# Patient Record
Sex: Male | Born: 1975 | Race: Black or African American | Hispanic: No | Marital: Married | Smoking: Never smoker
Health system: Southern US, Community
[De-identification: ages and names within clinical notes are randomized; demographics above are authoritative.]

## PROBLEM LIST (undated history)

## (undated) ENCOUNTER — Emergency Department (HOSPITAL_COMMUNITY): Admission: EM | Disposition: A | Discharge status: Home / Self Care | Payer: Self-pay

## (undated) DIAGNOSIS — F419 Anxiety disorder, unspecified: Secondary | ICD-10-CM

## (undated) DIAGNOSIS — R519 Headache, unspecified: Secondary | ICD-10-CM

## (undated) DIAGNOSIS — M1990 Unspecified osteoarthritis, unspecified site: Secondary | ICD-10-CM

## (undated) DIAGNOSIS — T7840XA Allergy, unspecified, initial encounter: Secondary | ICD-10-CM

## (undated) DIAGNOSIS — J329 Chronic sinusitis, unspecified: Secondary | ICD-10-CM

## (undated) DIAGNOSIS — F329 Major depressive disorder, single episode, unspecified: Secondary | ICD-10-CM

## (undated) DIAGNOSIS — E785 Hyperlipidemia, unspecified: Secondary | ICD-10-CM

## (undated) DIAGNOSIS — I10 Essential (primary) hypertension: Secondary | ICD-10-CM

## (undated) DIAGNOSIS — K219 Gastro-esophageal reflux disease without esophagitis: Secondary | ICD-10-CM

## (undated) DIAGNOSIS — F32A Depression, unspecified: Secondary | ICD-10-CM

## (undated) DIAGNOSIS — R51 Headache: Secondary | ICD-10-CM

## (undated) HISTORY — PX: SPINE SURGERY: SHX786

## (undated) HISTORY — DX: Essential (primary) hypertension: I10

## (undated) HISTORY — DX: Hyperlipidemia, unspecified: E78.5

## (undated) HISTORY — PX: CARDIAC CATHETERIZATION: SHX172

## (undated) HISTORY — DX: Allergy, unspecified, initial encounter: T78.40XA

---

## 2000-06-17 HISTORY — PX: OTHER SURGICAL HISTORY: SHX169

## 2003-06-05 ENCOUNTER — Emergency Department (HOSPITAL_COMMUNITY): Admission: AD | Admit: 2003-06-05 | Discharge: 2003-06-05 | Payer: Self-pay | Admitting: Family Medicine

## 2004-03-17 ENCOUNTER — Emergency Department (HOSPITAL_COMMUNITY): Admission: EM | Admit: 2004-03-17 | Discharge: 2004-03-17 | Payer: Self-pay | Admitting: Unknown Physician Specialty

## 2004-03-17 ENCOUNTER — Emergency Department (HOSPITAL_COMMUNITY): Admission: EM | Admit: 2004-03-17 | Discharge: 2004-03-17 | Payer: Self-pay | Admitting: Family Medicine

## 2004-03-18 ENCOUNTER — Emergency Department (HOSPITAL_COMMUNITY): Admission: EM | Admit: 2004-03-18 | Discharge: 2004-03-18 | Payer: Self-pay | Admitting: Emergency Medicine

## 2004-05-19 ENCOUNTER — Encounter: Admission: RE | Admit: 2004-05-19 | Discharge: 2004-05-19 | Payer: Self-pay | Admitting: Emergency Medicine

## 2004-05-30 ENCOUNTER — Encounter: Admission: RE | Admit: 2004-05-30 | Discharge: 2004-05-30 | Payer: Self-pay | Admitting: Emergency Medicine

## 2004-06-13 ENCOUNTER — Encounter: Admission: RE | Admit: 2004-06-13 | Discharge: 2004-06-13 | Payer: Self-pay | Admitting: Emergency Medicine

## 2006-05-13 ENCOUNTER — Emergency Department (HOSPITAL_COMMUNITY): Admission: EM | Admit: 2006-05-13 | Discharge: 2006-05-13 | Payer: Self-pay | Admitting: Emergency Medicine

## 2006-12-05 ENCOUNTER — Encounter: Admission: RE | Admit: 2006-12-05 | Discharge: 2006-12-05 | Payer: Self-pay | Admitting: Emergency Medicine

## 2009-08-30 ENCOUNTER — Emergency Department (HOSPITAL_COMMUNITY): Admission: EM | Admit: 2009-08-30 | Discharge: 2009-08-30 | Payer: Self-pay | Admitting: Emergency Medicine

## 2009-11-27 ENCOUNTER — Emergency Department (HOSPITAL_COMMUNITY): Admission: EM | Admit: 2009-11-27 | Discharge: 2009-11-27 | Payer: Self-pay | Admitting: Emergency Medicine

## 2010-02-07 ENCOUNTER — Ambulatory Visit (HOSPITAL_COMMUNITY): Admission: RE | Admit: 2010-02-07 | Discharge: 2010-02-07 | Payer: Self-pay | Admitting: Internal Medicine

## 2010-04-04 ENCOUNTER — Encounter
Admission: RE | Admit: 2010-04-04 | Discharge: 2010-05-23 | Payer: Self-pay | Source: Home / Self Care | Attending: Neurological Surgery | Admitting: Neurological Surgery

## 2010-05-03 ENCOUNTER — Emergency Department (HOSPITAL_COMMUNITY)
Admission: EM | Admit: 2010-05-03 | Discharge: 2010-05-03 | Payer: Self-pay | Source: Home / Self Care | Admitting: Family Medicine

## 2010-07-08 ENCOUNTER — Encounter: Payer: Self-pay | Admitting: Emergency Medicine

## 2010-08-28 LAB — POCT RAPID STREP A (OFFICE): Streptococcus, Group A Screen (Direct): NEGATIVE

## 2011-07-12 ENCOUNTER — Ambulatory Visit: Payer: Self-pay | Admitting: Family

## 2011-07-19 ENCOUNTER — Encounter: Payer: Self-pay | Admitting: Internal Medicine

## 2011-07-19 ENCOUNTER — Ambulatory Visit (INDEPENDENT_AMBULATORY_CARE_PROVIDER_SITE_OTHER): Payer: 59 | Admitting: Internal Medicine

## 2011-07-19 VITALS — BP 142/88 | HR 93 | Temp 98.8°F | Ht 72.0 in | Wt 246.0 lb

## 2011-07-19 DIAGNOSIS — E119 Type 2 diabetes mellitus without complications: Secondary | ICD-10-CM

## 2011-07-19 LAB — HEMOGLOBIN A1C: Hgb A1c MFr Bld: 8.1 % — ABNORMAL HIGH (ref 4.6–6.5)

## 2011-07-19 MED ORDER — LOSARTAN POTASSIUM-HCTZ 50-12.5 MG PO TABS: 1.0000 | ORAL_TABLET | Freq: Every day | ORAL | Status: DC

## 2011-07-19 MED ORDER — PRAVASTATIN SODIUM 40 MG PO TABS: 40.0000 mg | ORAL_TABLET | Freq: Every day | ORAL | Status: DC

## 2011-07-19 MED ORDER — METFORMIN HCL 500 MG PO TABS: 500.0000 mg | ORAL_TABLET | Freq: Two times a day (BID) | ORAL | Status: DC

## 2011-07-19 NOTE — Patient Instructions (Signed)
Please check your hemoglobin A1c every 3 months  Limit your sodium (Salt) intake    It is important that you exercise regularly, at least 20 minutes 3 to 4 times per week.  If you develop chest pain or shortness of breath seek  medical attention.   

## 2011-07-19 NOTE — Progress Notes (Signed)
Subjective:    Patient ID: Reginald Kirby, male    DOB: 19-Oct-1975, 36 y.o.   MRN: [id number]  HPI  36 year old patient who is seen today to establish with our practice. He has a history of hypertension since 2002 and diabetes mellitus and dyslipidemia since 2007. He is doing quite well today without concerns or complaints.  He does have a history of lumbar disc disease and has required epidurals in the past this was in 2002 has seen Dr. 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  HENT: Negative for hearing loss, ear pain, congestion, rhinorrhea, sneezing, mouth sores, trouble swallowing, neck pain, neck stiffness, dental problem, voice change, sinus pressure and tinnitus.   Eyes: Negative for photophobia, pain, redness and visual disturbance.  Respiratory: Negative for apnea, cough, choking, chest tightness, shortness of breath and wheezing.   Cardiovascular: Negative for chest pain, palpitations and leg swelling.  Gastrointestinal: Negative for nausea, vomiting, abdominal pain, diarrhea, constipation, blood in stool, abdominal distention, anal bleeding and rectal pain.  Genitourinary: Negative for dysuria, urgency, frequency, hematuria, flank pain, decreased urine volume, discharge, penile swelling, scrotal swelling, difficulty urinating, genital sores and testicular pain.  Musculoskeletal: Negative for myalgias, back pain, joint swelling, arthralgias and gait problem.  Skin: Negative for  color change, rash and wound.  Neurological: Negative for dizziness, tremors, seizures, syncope, facial asymmetry, speech difficulty, weakness, light-headedness, numbness and headaches.  Hematological: Negative for adenopathy. Does not bruise/bleed easily.  Psychiatric/Behavioral: Negative for suicidal ideas, hallucinations, behavioral problems, confusion, sleep disturbance, self-injury, dysphoric mood, decreased concentration and agitation. The patient is not nervous/anxious.        Objective:   Physical Exam  Constitutional: He appears well-developed and well-nourished.  HENT:  Head: Normocephalic and atraumatic.  Right Ear: External ear normal.  Left Ear: External ear normal.  Nose: Nose normal.  Mouth/Throat: Oropharynx is clear and moist.  Eyes: Conjunctivae and EOM are normal. Pupils are equal, round, and reactive to light. No scleral icterus.  Neck: Normal range of motion. Neck supple. No JVD present. No thyromegaly present.  Cardiovascular: Regular rhythm, normal heart sounds and intact distal pulses.  Exam reveals no gallop and no friction rub.   No murmur heard. Pulmonary/Chest: Effort normal and breath sounds normal. He exhibits no tenderness.  Abdominal: Soft. Bowel sounds are normal. He exhibits no distension and no mass. There is no tenderness.  Genitourinary: Prostate normal and penis normal.  Musculoskeletal: Normal range of motion. He exhibits no edema and no tenderness.  Lymphadenopathy:    He has no cervical adenopathy.  Neurological: He is alert. He has normal reflexes. No cranial nerve deficit. Coordination normal.  Skin: Skin is warm and dry. No rash noted.  Psychiatric: He has a normal mood and affect. His behavior is normal.          Assessment & Plan:  Preventive health examination Hypertension well controlled Dyslipidemia. The patient has been off prednisone for several months due to myalgias he had been on this medication for a number of  years prior.  He will rechallenge with the pravastatin and a lipid profile will be checked in 3 months. He'll again discontinue this medication if not well tolerated Diabetes mellitus we'll check a hemoglobin A1c. Continue efforts at diet weight loss and exercise

## 2011-07-23 NOTE — Progress Notes (Signed)
Quick Note:  Pt aware ______ 

## 2011-09-05 ENCOUNTER — Telehealth: Payer: Self-pay | Admitting: *Deleted

## 2011-09-05 MED ORDER — METFORMIN HCL 1000 MG PO TABS: 1000.0000 mg | ORAL_TABLET | Freq: Two times a day (BID) | ORAL | Status: DC

## 2011-09-05 NOTE — Telephone Encounter (Signed)
I see where lab done and dose was changed but not changed in chart - will do now and send new rx to pharmacy Attempt to call pharmacy x3 - busy - no alternate # to call

## 2011-09-05 NOTE — Telephone Encounter (Signed)
[facility] has questions about Metformin dosages and strength.  Please call and confirm. Pt states he not taking meds how his prescription is written??

## 2011-10-07 ENCOUNTER — Telehealth: Payer: Self-pay | Admitting: Family Medicine

## 2011-10-07 NOTE — Telephone Encounter (Signed)
[address] [place], [place] [address] p. [phone] f. [phone] To: [facility] Fax: [phone] From: [facility] Date/ Time: 10/06/2011 10:03 AM Taken By: Ezra Sites, RN Caller: Alden Server Facility: Not Collected Patient: Reginald Kirby, Reginald Kirby DOB: [birth year] Phone: [phone] Reason for Call: Caller was unable to be reached on callback - Left Message Regarding Appointment: No Appt Date: Appt Time: Unknown Provider: Reason: Details: Outcome:

## 2011-10-07 NOTE — Telephone Encounter (Signed)
Call Report Triage Record Num: [id number] Operator: Chevis Pretty Patient Name: Reginald Kirby Call Date & Time: 10/06/2011 9:59:08AM Patient Phone: [phone] PCP: Gordy Savers Patient Gender: Male PCP Fax : [phone] Patient DOB: 26-Mar-1976 [facility] Name: [facility] Reason for Call: Caller: Reginald Kirby/Patient; PCP: Eleonore Chiquito; CB#: [phone]; Call regarding Sinus congestion and bleeding, Low Grade Fever; on callback, [facility]; no patient noted by this name. Message left to contact office. Protocol(s) Used: No Contact or Duplicate Contact Calls (Adult) Recommended Outcome per Protocol: No Contact Reason for Outcome: Wrong number reached. Answering service notified. Care Advice: ~ 04/

## 2011-10-21 ENCOUNTER — Ambulatory Visit: Payer: 59 | Admitting: Internal Medicine

## 2011-10-23 ENCOUNTER — Ambulatory Visit (INDEPENDENT_AMBULATORY_CARE_PROVIDER_SITE_OTHER): Payer: 59 | Admitting: Internal Medicine

## 2011-10-23 ENCOUNTER — Encounter: Payer: Self-pay | Admitting: Internal Medicine

## 2011-10-23 DIAGNOSIS — E785 Hyperlipidemia, unspecified: Secondary | ICD-10-CM

## 2011-10-23 DIAGNOSIS — E119 Type 2 diabetes mellitus without complications: Secondary | ICD-10-CM | POA: Insufficient documentation

## 2011-10-23 DIAGNOSIS — I10 Essential (primary) hypertension: Secondary | ICD-10-CM | POA: Insufficient documentation

## 2011-10-23 LAB — COMPREHENSIVE METABOLIC PANEL
ALT: 36 U/L (ref 0–53)
AST: 21 U/L (ref 0–37)
Albumin: 4.5 g/dL (ref 3.5–5.2)
Alkaline Phosphatase: 40 U/L (ref 39–117)
BUN: 14 mg/dL (ref 6–23)
CO2: 29 mEq/L (ref 19–32)
Calcium: 10 mg/dL (ref 8.4–10.5)
Chloride: 97 mEq/L (ref 96–112)
Creatinine, Ser: 0.9 mg/dL (ref 0.4–1.5)
GFR: 126.28 mL/min (ref >60.00)
Glucose, Bld: 124 mg/dL — ABNORMAL HIGH (ref 70–99)
Potassium: 4.2 mEq/L (ref 3.5–5.1)
Sodium: 138 mEq/L (ref 135–145)
Total Bilirubin: 0.6 mg/dL (ref 0.3–1.2)
Total Protein: 8.4 g/dL — ABNORMAL HIGH (ref 6.0–8.3)

## 2011-10-23 LAB — CBC WITH DIFFERENTIAL/PLATELET
Basophils Absolute: 0 10*3/uL (ref 0.0–0.1)
Basophils Relative: 0.4 % (ref 0.0–3.0)
Eosinophils Absolute: 0.1 10*3/uL (ref 0.0–0.7)
Eosinophils Relative: 1.4 % (ref 0.0–5.0)
HCT: 42.7 % (ref 39.0–52.0)
Hemoglobin: 14.1 g/dL (ref 13.0–17.0)
Lymphocytes Relative: 55 % — ABNORMAL HIGH (ref 12.0–46.0)
Lymphs Abs: 2.3 10*3/uL (ref 0.7–4.0)
MCHC: 33.1 g/dL (ref 30.0–36.0)
MCV: 73.1 fl — ABNORMAL LOW (ref 78.0–100.0)
Monocytes Absolute: 0.3 10*3/uL (ref 0.1–1.0)
Monocytes Relative: 6.4 % (ref 3.0–12.0)
Neutro Abs: 1.6 10*3/uL (ref 1.4–7.7)
Neutrophils Relative %: 36.8 % — ABNORMAL LOW (ref 43.0–77.0)
Platelets: 325 10*3/uL (ref 150.0–400.0)
RBC: 5.84 Mil/uL — ABNORMAL HIGH (ref 4.22–5.81)
RDW: 13.2 % (ref 11.5–14.6)
WBC: 4.2 10*3/uL — ABNORMAL LOW (ref 4.5–10.5)

## 2011-10-23 LAB — LIPID PANEL
Cholesterol: 165 mg/dL (ref 0–200)
HDL: 50.9 mg/dL (ref >39.00)
LDL Cholesterol: 85 mg/dL (ref 0–99)
Total CHOL/HDL Ratio: 3
Triglycerides: 148 mg/dL (ref 0.0–149.0)
VLDL: 29.6 mg/dL (ref 0.0–40.0)

## 2011-10-23 LAB — TSH: TSH: 1.79 u[IU]/mL (ref 0.35–5.50)

## 2011-10-23 LAB — GLUCOSE, POCT (MANUAL RESULT ENTRY): POC Glucose: 78

## 2011-10-23 LAB — HEMOGLOBIN A1C: Hgb A1c MFr Bld: 7.7 % — ABNORMAL HIGH (ref 4.6–6.5)

## 2011-10-23 MED ORDER — CYCLOBENZAPRINE HCL 10 MG PO TABS: 10.0000 mg | ORAL_TABLET | Freq: Three times a day (TID) | ORAL | Status: AC | PRN

## 2011-10-23 MED ORDER — ESCITALOPRAM OXALATE 20 MG PO TABS: 20.0000 mg | ORAL_TABLET | Freq: Every day | ORAL | Status: DC

## 2011-10-23 NOTE — Progress Notes (Signed)
  Subjective:    Patient ID: Reginald Kirby, male    DOB: 08-26-1975, 36 y.o.   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 Last hemoglobin A1c 8.1 and his metformin increased to 2 g daily in divided dosages. He is noted much improved glycemic control no concerns or complaints today he does have treated hypertension and dyslipidemia he does monitor blood pressure readings with nice results. He has been on Prozac in the past for mood disorder this caused insomnia and he is requesting an alternate medication. He has occasional low back pain and is requesting a refill on Flexeril.  Wt Readings from Last 3 Encounters:  10/23/11 250 lb (113.399 kg)  07/19/11 246 lb (111.585 kg)    Review of Systems  Constitutional: Negative for fever, chills, appetite change and fatigue.  HENT: Negative for hearing loss, ear pain, congestion, sore throat, trouble swallowing, neck stiffness, dental problem, voice change and tinnitus.   Eyes: Negative for pain, discharge and visual disturbance.  Respiratory: Negative for cough, chest tightness, wheezing and stridor.   Cardiovascular: Negative for chest pain, palpitations and leg swelling.  Gastrointestinal: Negative for nausea, vomiting, abdominal pain, diarrhea, constipation, blood in stool and abdominal distention.  Genitourinary: Negative for urgency, hematuria, flank pain, discharge, difficulty urinating and genital sores.  Musculoskeletal: Positive for back pain. Negative for myalgias, joint swelling, arthralgias and gait problem.  Skin: Negative for rash.  Neurological: Negative for dizziness, syncope, speech difficulty, weakness, numbness and headaches.  Hematological: Negative for adenopathy. Does not bruise/bleed easily.  Psychiatric/Behavioral: Positive for behavioral problems. Negative for dysphoric mood. The patient is not nervous/anxious.        Objective:   Physical Exam  Constitutional: He appears  well-developed and well-nourished. No distress.       Weight 250 Blood pressure 140/90          Assessment & Plan:   Diabetes mellitus. We'll check a hemoglobin A1c Hypertension stable Dyslipidemia   Recheck 3 months

## 2011-10-23 NOTE — Patient Instructions (Addendum)
Limit your sodium (Salt) intake    It is important that you exercise regularly, at least 20 minutes 3 to 4 times per week.  If you develop chest pain or shortness of breath seek  medical attention.   Please check your hemoglobin A1c every 3 months  Back Pain, Adult Low back pain is very common. About 1 in 5 people have back pain. The cause of low back pain is rarely dangerous. The pain often gets better over time. About half of people with a sudden onset of back pain feel better in just 2 weeks. About 8 in 10 people feel better by 6 weeks.   CAUSES Some common causes of back pain include:  Strain of the muscles or ligaments supporting the spine.   Wear and tear (degeneration) of the spinal discs.   Arthritis.   Direct injury to the back.  DIAGNOSIS Most of the time, the direct cause of low back pain is not known. However, back pain can be treated effectively even when the exact cause of the pain is unknown. Answering your caregiver's questions about your overall health and symptoms is one of the most accurate ways to make sure the cause of your pain is not dangerous. If your caregiver needs more information, he or she may order lab work or imaging tests (X-rays or MRIs). However, even if imaging tests show changes in your back, this usually does not require surgery. HOME CARE INSTRUCTIONS For many people, back pain returns. Since low back pain is rarely dangerous, it is often a condition that people can learn to manage on their own.    Remain active. It is stressful on the back to sit or stand in one place. Do not sit, drive, or stand in one place for more than 30 minutes at a time. Take short walks on level surfaces as soon as pain allows. Try to increase the length of time you walk each day.   Do not stay in bed. Resting more than 1 or 2 days can delay your recovery.   Do not avoid exercise or work. Your body is made to move. It is not dangerous to be active, even though your back may  hurt. Your back will likely heal faster if you return to being active before your pain is gone.   Pay attention to your body when you  bend and lift. Many people have less discomfort when lifting if they bend their knees, keep the load close to their bodies, and avoid twisting. Often, the most comfortable positions are those that put less stress on your recovering back.   Find a comfortable position to sleep. Use a firm mattress and lie on your side with your knees slightly bent. If you lie on your back, put a pillow under your knees.   Only take over-the-counter or prescription medicines as directed by your caregiver. Over-the-counter medicines to reduce pain and inflammation are often the most helpful. Your caregiver may prescribe muscle relaxant drugs. These medicines help dull your pain so you can more quickly return to your normal activities and healthy exercise.   Put ice on the injured area.   Put ice in a plastic bag.   Place a towel between your skin and the bag.   Leave the ice on for 15 to 20 minutes, 3 to 4 times a day for the first 2 to 3 days. After that, ice and heat may be alternated to reduce pain and spasms.   Ask your caregiver  about trying back exercises and gentle massage. This may be of some benefit.   Avoid feeling anxious or stressed. Stress increases muscle tension and can worsen back pain. It is important to recognize when you are anxious or stressed and learn ways to manage it. Exercise is a great option.  SEEK MEDICAL CARE IF:  You have pain that is not relieved with rest or medicine.   You have pain that does not improve in 1 week.   You have new symptoms.   You are generally not feeling well.  SEEK IMMEDIATE MEDICAL CARE IF:    You have pain that radiates from your back into your legs.   You develop new bowel or bladder control problems.   You have unusual weakness or numbness in your arms or legs.   You develop nausea or vomiting.   You develop  abdominal pain.   You feel faint.  Document Released: 06/03/2005 Document Revised: 05/23/2011 Document Reviewed: 10/22/2010 [facility] Patient Information 2012 [place], [place].

## 2011-10-24 ENCOUNTER — Other Ambulatory Visit: Payer: Self-pay | Admitting: Internal Medicine

## 2011-10-24 MED ORDER — GLIPIZIDE ER 5 MG PO TB24: 5.0000 mg | ORAL_TABLET | Freq: Every day | ORAL | Status: DC

## 2011-10-24 NOTE — Progress Notes (Signed)
Quick Note:  Spoke with pt- informed of lab and new med to be called out. ______

## 2011-10-24 NOTE — Progress Notes (Signed)
Quick Note:  According to med list - pt on Metformin1000 bid- please advise ______

## 2012-01-14 ENCOUNTER — Telehealth: Payer: Self-pay | Admitting: Internal Medicine

## 2012-01-14 NOTE — Telephone Encounter (Signed)
Pt would like to come in for an A1c Check please advise

## 2012-01-14 NOTE — Telephone Encounter (Signed)
Please call and schedule 3 mos rov- it id due 8.8.13 - it will be done at that time - last seen in may

## 2012-01-15 NOTE — Telephone Encounter (Signed)
lmom for pt to call back and schedule appt 

## 2012-01-19 ENCOUNTER — Encounter (HOSPITAL_COMMUNITY): Payer: Self-pay | Admitting: *Deleted

## 2012-01-19 ENCOUNTER — Emergency Department (HOSPITAL_COMMUNITY)
Admission: EM | Admit: 2012-01-19 | Discharge: 2012-01-19 | Disposition: A | Discharge status: Home / Self Care | Payer: 59 | Source: Home / Self Care | Attending: Emergency Medicine | Admitting: Emergency Medicine

## 2012-01-19 DIAGNOSIS — R0982 Postnasal drip: Secondary | ICD-10-CM

## 2012-01-19 DIAGNOSIS — J029 Acute pharyngitis, unspecified: Secondary | ICD-10-CM

## 2012-01-19 HISTORY — DX: Chronic sinusitis, unspecified: J32.9

## 2012-01-19 LAB — POCT RAPID STREP A: Streptococcus, Group A Screen (Direct): NEGATIVE

## 2012-01-19 MED ORDER — LIDOCAINE VISCOUS 2 % MT SOLN: 10.0000 mL | Freq: Three times a day (TID) | OROMUCOSAL | Status: AC | PRN

## 2012-01-19 MED ORDER — FLUTICASONE PROPIONATE 50 MCG/ACT NA SUSP: 2.0000 | Freq: Every day | NASAL | Status: DC

## 2012-01-19 MED ORDER — IBUPROFEN 600 MG PO TABS: 600.0000 mg | ORAL_TABLET | Freq: Four times a day (QID) | ORAL | Status: AC | PRN

## 2012-01-19 MED ORDER — SUCRALFATE 1 GM/10ML PO SUSP: 0.5000 g | Freq: Four times a day (QID) | ORAL | Status: DC

## 2012-01-19 MED ORDER — HYDROCODONE-ACETAMINOPHEN 5-325 MG PO TABS: 2.0000 | ORAL_TABLET | ORAL | Status: AC | PRN

## 2012-01-19 NOTE — ED Provider Notes (Signed)
History     CSN: [id number]  Arrival date & time 01/19/12  1123   First MD Initiated Contact with Patient 01/19/12 1149      Chief Complaint  Patient presents with  . Sore Throat  . Otalgia  . Ear Fullness    (Consider location/radiation/quality/duration/timing/severity/associated sxs/prior treatment) HPI Comments: The patient reports sore throat,  postnasal drip, rhinorrhea for the past week. Reports sensation of left ear fullness. States his sore throat got worse in the right side today, and is now radiating to his right ear. No hearing changes. No fevers, nausea, vomiting, coughing, wheezing, chest pain, shortness of breath. No voice changes, sensation of throat swelling shut, drooling, trismus. He has been taking over-the-counter medications, including Mucinex D, saline nasal irrigation with mild improvement. His hypertension, but states his blood pressure has been fine, even while taking Mucinex D. He is a diabetic, but states that his glucose has been under excellent control. Patient is an ICU nurse, and has multiple sick contacts.  ROS as noted in HPI. All other ROS negative.   Patient is a 36 y.o. male presenting with pharyngitis. The history is provided by the patient. No language interpreter was used.  Sore Throat This is a new problem. The current episode started more than 2 days ago. The problem occurs constantly. The problem has not changed since onset.The symptoms are aggravated by swallowing. Nothing relieves the symptoms. Treatments tried: otc meds. The treatment provided no relief.    Past Medical History  Diagnosis Date  . Diabetes mellitus   . Hyperlipidemia   . Hypertension   . Sinusitis     Past Surgical History  Procedure Date  . Epidural injections  2002    History reviewed. No pertinent family history.  History  Substance Use Topics  . Smoking status: Never Smoker   . Smokeless tobacco: Never Used  . Alcohol Use: Yes     occ.       Review of  Systems  Allergies  Demerol  Home Medications   Current Outpatient Rx  Name Route Sig Dispense Refill  . GLIPIZIDE ER 5 MG PO TB24 Oral Take 1 tablet (5 mg total) by mouth daily. 90 tablet 1  . LOSARTAN POTASSIUM-HCTZ 50-12.5 MG PO TABS Oral Take 1 tablet by mouth daily. 90 tablet 4  . METFORMIN HCL 1000 MG PO TABS Oral Take 1 tablet (1,000 mg total) by mouth 2 (two) times daily with a meal. 90 tablet 3  . OVER THE COUNTER MEDICATION  Throat lozengers    . PRAVASTATIN SODIUM 40 MG PO TABS Oral Take 1 tablet (40 mg total) by mouth daily. 90 tablet 4  . ESCITALOPRAM OXALATE 20 MG PO TABS Oral Take 1 tablet (20 mg total) by mouth daily. 90 tablet 3  . FLUTICASONE PROPIONATE 50 MCG/ACT NA SUSP Nasal Place 2 sprays into the nose daily. 16 g 0  . HYDROCODONE-ACETAMINOPHEN 5-325 MG PO TABS Oral Take 2 tablets by mouth every 4 (four) hours as needed for pain. 20 tablet 0  . IBUPROFEN 600 MG PO TABS Oral Take 1 tablet (600 mg total) by mouth every 6 (six) hours as needed for pain. 30 tablet 0  . LIDOCAINE VISCOUS 2 % MT SOLN Oral Take 10 mLs by mouth 3 (three) times daily as needed for pain. Swish and spit. Do not swallow. 100 mL 0  . SUCRALFATE 1 GM/10ML PO SUSP Oral Take 5 mLs (0.5 g total) by mouth 4 (four) times daily. 240 mL  0    BP 154/87  Pulse 80  Temp 98.7 F (37.1 C) (Oral)  Resp 18  SpO2 100%  Physical Exam  Nursing note and vitals reviewed. Constitutional: He is oriented to person, place, and time. He appears well-developed and well-nourished.  HENT:  Head: Normocephalic and atraumatic. No trismus in the jaw.  Right Ear: Tympanic membrane normal.  Left Ear: Tympanic membrane normal.  Nose: Mucosal edema and rhinorrhea present. Right sinus exhibits no maxillary sinus tenderness and no frontal sinus tenderness. Left sinus exhibits no frontal sinus tenderness.  Mouth/Throat: Uvula is midline and mucous membranes are normal. Posterior oropharyngeal erythema present. No  oropharyngeal exudate or tonsillar abscesses.       Cobblestoned, irritated oropharynx  Eyes: Conjunctivae and EOM are normal.  Neck: Normal range of motion. Neck supple.  Cardiovascular: Normal rate, regular rhythm and normal heart sounds.   Pulmonary/Chest: Effort normal and breath sounds normal. No respiratory distress.  Abdominal: He exhibits no distension.  Musculoskeletal: Normal range of motion.  Neurological: He is alert and oriented to person, place, and time. Coordination normal.  Skin: Skin is warm and dry.  Psychiatric: He has a normal mood and affect. His behavior is normal. Judgment and thought content normal.    ED Course  Procedures (including critical care time)   Labs Reviewed  POCT RAPID STREP A ([facility] CARE ONLY)   No results found.   1. Postnasal drip   2. Pharyngitis       MDM  . Rapid strep negative. Sore throat most likely from postnasal drip. Home with Flonase. He will continue Mucinex, saline nasal irrigation, supportive treatment. Followup with PMD as needed.  Luiz Blare, MD 01/19/12 [id number]

## 2012-01-19 NOTE — ED Notes (Signed)
Pt with onset of sore throat x one week - right ear ache and left ear feels full - denies cough or sinus congestion or fever

## 2012-01-20 ENCOUNTER — Telehealth: Payer: Self-pay | Admitting: Family Medicine

## 2012-01-20 NOTE — Telephone Encounter (Signed)
[facility] Triage Call Report Triage Record Num: [id number] Operator: Thayer Headings Patient Name: Reginald Kirby Call Date & Time: 01/19/2012 9:23:24AM Patient Phone: [phone] PCP: Gordy Savers Patient Gender: Male PCP Fax : [phone] Patient DOB: 06/27/1975 [facility] Name[facility] Reason for Call: Caller: Izzy/Patient; Primary Care Physcian: Eleonore Chiquito; Call Back #: [phone]; Calling today 01/19/12 regarding Sore Throat; onset around 01/12/12. Ibuprofen and fluids helped initially, then started feeling discomfort again on 8/1/3 . Now having painful swallowing. Afebrile. Emergent symptoms ruled out by Sore Throat or Hoarseness guidelines with exception of marked difficulty swallowing due to sore throat unresponsive to 12 hours of home care. Advised to [facility]. Care advice given. Protocol(s) Used: Sore Throat or Hoarseness Recommended Outcome per Protocol: See Provider within 4 hours Reason for Outcome: Marked difficulty swallowing due to sore throat unresponsive to 12 hours of home care Care Advice: ~ Use a cool mist humidifier to moisten air. Be sure to clean according to manufacturer's instructions. ~ May inhale steam from hot shower or heated water. Be careful to avoid burns. Call EMS 911 if sudden onset or sudden worsening of breathing problems, struggling to breathe, high pitched noise when breathing in (stridor), unable to speak, grasping at throat, or panic/anxiety because of breathing problems. ~ ~ SYMPTOM / CONDITION MANAGEMENT Analgesic/Antipyretic Advice - Acetaminophen: Consider acetaminophen as directed on label or by pharmacist/provider for pain or fever PRECAUTIONS: - Use if there is no history of liver disease, alcoholism, or intake of three or more alcohol drinks per day - Only if approved by provider during pregnancy or when breastfeeding - During pregnancy, acetaminophen should not be taken more than 3 consecutive  days without telling provider - Do not exceed recommended dose or frequency ~ Sore Throat Relief: - Use warm salt water gargles 3 to 4 times/day, as needed (1/2 tsp. salt in 8 oz. [.2 liters] water). - Suck on hard candy, nonprescription or herbal throat lozenges (sugar-free if diabetic) - Eat soothing, soft food/fluids (broths, soups, or honey and lemon juice in hot tea, Popsicles, frozen yogurt or sherbet, scrambled eggs, cooked cereals, Jell-O or puddings) whichever is most comforting. - Avoid eating salty, spicy or acidic foods. ~ Analgesic/Antipyretic Advice - NSAIDs: Consider aspirin, ibuprofen, naproxen or ketoprofen for pain or fever as directed on label or by pharmacist/provider. PRECAUTIONS: - If over 24 years of age, should not take longer than 1 week without consulting provider. EXCEPTIONS: - Should not be used if taking blood thinners or have bleeding problems. - Do not use if have history of sensitivity/allergy to any of these medications; or history of cardiovascular, ulcer, kidney, liver disease or diabetes unless approved by provider. - Do not exceed recommended dose or frequency. ~ 08/04/

## 2012-01-21 ENCOUNTER — Ambulatory Visit (INDEPENDENT_AMBULATORY_CARE_PROVIDER_SITE_OTHER): Payer: 59 | Admitting: Internal Medicine

## 2012-01-21 ENCOUNTER — Encounter: Payer: Self-pay | Admitting: Internal Medicine

## 2012-01-21 VITALS — BP 140/100 | Temp 98.7°F | Wt 255.0 lb

## 2012-01-21 DIAGNOSIS — I10 Essential (primary) hypertension: Secondary | ICD-10-CM

## 2012-01-21 DIAGNOSIS — E119 Type 2 diabetes mellitus without complications: Secondary | ICD-10-CM

## 2012-01-21 MED ORDER — ACCU-CHEK INSTANT CONTROL VI LIQD: 1.0000 | Status: DC

## 2012-01-21 MED ORDER — LOSARTAN POTASSIUM-HCTZ 100-25 MG PO TABS: 1.0000 | ORAL_TABLET | Freq: Every day | ORAL | Status: DC

## 2012-01-21 MED ORDER — GLUCOSE BLOOD VI STRP: 1.0000 | ORAL_STRIP | Freq: Every day | Status: DC

## 2012-01-21 NOTE — Patient Instructions (Signed)
Limit your sodium (Salt) intake    It is important that you exercise regularly, at least 20 minutes 3 to 4 times per week.  If you develop chest pain or shortness of breath seek  medical attention.  You need to lose weight.  Consider a lower calorie diet and regular exercise.   Please check your hemoglobin A1c every 3 months   

## 2012-01-21 NOTE — Progress Notes (Signed)
Subjective:    Patient ID: Reginald Kirby., male    DOB: [birth year], 36 y.o.   MRN: [id number]  HPI  36 year old patient who has type 2 diabetes hypertension and dyslipidemia. He is seen today for his quarterly followup. Unfortunately there is been some modest weight gain. He seems to enjoy improved glycemic control.. Glipizide was added to his regimen 3 months ago. He has treated hypertension.  Past Medical History  Diagnosis Date  . Diabetes mellitus   . Hyperlipidemia   . Hypertension   . Sinusitis     History   Social History  . Marital Status: Married    Spouse Name: N/A    Number of Children: N/A  . Years of Education: N/A   Occupational History  . Not on file.   Social History Main Topics  . Smoking status: Never Smoker   . Smokeless tobacco: Never Used  . Alcohol Use: Yes     occ.   . Drug Use: No  . Sexually Active: Not on file   Other Topics Concern  . Not on file   Social History Narrative  . No narrative on file    Past Surgical History  Procedure Date  . Epidural injections  2002    No family history on file.  Allergies  Allergen Reactions  . Demerol Itching    Current Outpatient Prescriptions on File Prior to Visit  Medication Sig Dispense Refill  . fluticasone (FLONASE) 50 MCG/ACT nasal spray Place 2 sprays into the nose daily.  16 g  0  . glipiZIDE (GLUCOTROL XL) 5 MG 24 hr tablet Take 1 tablet (5 mg total) by mouth daily.  90 tablet  1  . HYDROcodone-acetaminophen (NORCO/VICODIN) 5-325 MG per tablet Take 2 tablets by mouth every 4 (four) hours as needed for pain.  20 tablet  0  . ibuprofen (ADVIL,MOTRIN) 600 MG tablet Take 1 tablet (600 mg total) by mouth every 6 (six) hours as needed for pain.  30 tablet  0  . lidocaine (XYLOCAINE) 2 % solution Take 10 mLs by mouth 3 (three) times daily as needed for pain. Swish and spit. Do not swallow.  100 mL  0  . losartan-hydrochlorothiazide (HYZAAR) 50-12.5 MG per tablet Take 1 tablet by mouth  daily.  90 tablet  4  . metFORMIN (GLUCOPHAGE) 1000 MG tablet Take 1 tablet (1,000 mg total) by mouth 2 (two) times daily with a meal.  90 tablet  3  . OVER THE COUNTER MEDICATION Throat lozengers      . pravastatin (PRAVACHOL) 40 MG tablet Take 1 tablet (40 mg total) by mouth daily.  90 tablet  4  . sucralfate (CARAFATE) 1 GM/10ML suspension Take 5 mLs (0.5 g total) by mouth 4 (four) times daily.  240 mL  0    BP 140/100  Temp 98.7 F (37.1 C) (Oral)  Wt 255 lb (115.667 kg)      Review of Systems  Constitutional: Negative for fever, chills, appetite change and fatigue.  HENT: Negative for hearing loss, ear pain, congestion, sore throat, trouble swallowing, neck stiffness, dental problem, voice change and tinnitus.   Eyes: Negative for pain, discharge and visual disturbance.  Respiratory: Negative for cough, chest tightness, wheezing and stridor.   Cardiovascular: Negative for chest pain, palpitations and leg swelling.  Gastrointestinal: Negative for nausea, vomiting, abdominal pain, diarrhea, constipation, blood in stool and abdominal distention.  Genitourinary: Negative for urgency, hematuria, flank pain, discharge, difficulty urinating and genital sores.  Musculoskeletal:  Negative for myalgias, back pain, joint swelling, arthralgias and gait problem.  Skin: Negative for rash.  Neurological: Negative for dizziness, syncope, speech difficulty, weakness, numbness and headaches.  Hematological: Negative for adenopathy. Does not bruise/bleed easily.  Psychiatric/Behavioral: Negative for behavioral problems and dysphoric mood. The patient is not nervous/anxious.        Objective:   Physical Exam  Constitutional: He is oriented to person, place, and time. He appears well-developed.       Blood pressure 141/100  HENT:  Head: Normocephalic.  Right Ear: External ear normal.  Left Ear: External ear normal.  Eyes: Conjunctivae and EOM are normal.  Neck: Normal range of motion.    Cardiovascular: Normal rate and normal heart sounds.   Pulmonary/Chest: Breath sounds normal.  Abdominal: Bowel sounds are normal.  Musculoskeletal: Normal range of motion. He exhibits no edema and no tenderness.  Neurological: He is alert and oriented to person, place, and time.  Psychiatric: He has a normal mood and affect. His behavior is normal.          Assessment & Plan:

## 2012-01-21 NOTE — Progress Notes (Signed)
  Subjective:    Patient ID: Reginald Kirby., male    DOB: 1976/04/03, 36 y.o.   MRN: [id number]  HPI  Wt Readings from Last 3 Encounters:  01/21/12 255 lb (115.667 kg)  10/23/11 250 lb (113.399 kg)  07/19/11 246 lb (111.585 kg)   BP Readings from Last 3 Encounters:  01/21/12 140/100  01/19/12 154/87  10/23/11 122/88     Review of Systems     Objective:   Physical Exam        Assessment & Plan:

## 2012-01-22 LAB — HEMOGLOBIN A1C: Hgb A1c MFr Bld: 6.9 % — ABNORMAL HIGH (ref 4.6–6.5)

## 2012-01-23 ENCOUNTER — Ambulatory Visit: Payer: 59 | Admitting: Internal Medicine

## 2012-01-27 ENCOUNTER — Telehealth: Payer: Self-pay | Admitting: Internal Medicine

## 2012-01-27 ENCOUNTER — Ambulatory Visit: Payer: 59 | Admitting: Internal Medicine

## 2012-01-27 NOTE — Telephone Encounter (Signed)
Pt called req to get lab results. Pls call.  °

## 2012-01-27 NOTE — Telephone Encounter (Signed)
Spoke with pt- labs normal - continue diet and exercise

## 2012-03-13 ENCOUNTER — Other Ambulatory Visit: Payer: Self-pay | Admitting: Internal Medicine

## 2012-04-23 ENCOUNTER — Other Ambulatory Visit: Payer: Self-pay | Admitting: Internal Medicine

## 2012-08-21 ENCOUNTER — Other Ambulatory Visit: Payer: Self-pay | Admitting: Internal Medicine

## 2012-09-14 ENCOUNTER — Other Ambulatory Visit: Payer: Self-pay | Admitting: Internal Medicine

## 2013-01-25 ENCOUNTER — Other Ambulatory Visit: Payer: Self-pay | Admitting: Internal Medicine

## 2013-02-09 ENCOUNTER — Ambulatory Visit: Payer: 59 | Admitting: Internal Medicine

## 2013-02-12 ENCOUNTER — Encounter: Payer: Self-pay | Admitting: Internal Medicine

## 2013-02-12 ENCOUNTER — Ambulatory Visit (INDEPENDENT_AMBULATORY_CARE_PROVIDER_SITE_OTHER): Payer: 59 | Admitting: Internal Medicine

## 2013-02-12 VITALS — BP 130/84 | HR 82 | Temp 98.6°F | Resp 20 | Wt 246.0 lb

## 2013-02-12 DIAGNOSIS — L089 Local infection of the skin and subcutaneous tissue, unspecified: Secondary | ICD-10-CM

## 2013-02-12 DIAGNOSIS — E119 Type 2 diabetes mellitus without complications: Secondary | ICD-10-CM

## 2013-02-12 DIAGNOSIS — L723 Sebaceous cyst: Secondary | ICD-10-CM

## 2013-02-12 LAB — HEMOGLOBIN A1C
Hgb A1c MFr Bld: 6.4 % — ABNORMAL HIGH (ref <5.7)
Mean Plasma Glucose: 137 mg/dL — ABNORMAL HIGH (ref <117)

## 2013-02-12 LAB — GLUCOSE, POCT (MANUAL RESULT ENTRY): POC Glucose: 92 mg/dl (ref 70–99)

## 2013-02-12 MED ORDER — CEPHALEXIN 500 MG PO CAPS: 500.0000 mg | ORAL_CAPSULE | Freq: Four times a day (QID) | ORAL | Status: DC

## 2013-02-12 NOTE — Patient Instructions (Signed)
Limit your sodium (Salt) intake    It is important that you exercise regularly, at least 20 minutes 3 to 4 times per week.  If you develop chest pain or shortness of breath seek  medical attention.  You need to lose weight.  Consider a lower calorie diet and regular exercise.   Please check your hemoglobin A1c every 3 months   

## 2013-02-12 NOTE — Progress Notes (Signed)
  Subjective:    Patient ID: Reginald Kirby., male    DOB: 10-17-1975, 37 y.o.   MRN: [id number]  HPI  37 year old patient who presents with a five-day history of increasing pain and swelling involving a nodule of the right posterior lateral neck region. He states that he has noted a small subcutaneous nodule for a number of years but has increased in size over the past few days it has become more tender. There's been no fever or drainage.  He has a history of diabetes controlled on oral medications. He has not been seen in one year. Additionally he has hypertension and dyslipidemia  Social history. 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  HENT: Negative for hearing loss, ear pain, congestion, sore throat, trouble swallowing, neck stiffness, dental problem, voice change and tinnitus.   Eyes: Negative for pain, discharge and visual disturbance.  Respiratory: Negative for cough, chest tightness, wheezing and stridor.   Cardiovascular: Negative for chest pain, palpitations and leg swelling.  Gastrointestinal: Negative for nausea, vomiting, abdominal pain, diarrhea, constipation, blood in stool and abdominal distention.  Genitourinary: Negative for urgency, hematuria, flank pain, discharge, difficulty urinating and genital sores.  Musculoskeletal: Negative for myalgias, back pain, joint swelling, arthralgias and gait problem.  Skin: Negative for rash.  Neurological: Negative for dizziness, syncope, speech difficulty, weakness, numbness and headaches.  Hematological: Negative for adenopathy. Does not bruise/bleed easily.  Psychiatric/Behavioral: Negative for behavioral problems and dysphoric mood. The patient is not nervous/anxious.        Objective:   Physical Exam  Constitutional: He appears well-developed and well-nourished. No distress.  Neck:  2-1/2 cm painful nodule  involving the left posterior lateral neck          Assessment & Plan:   Diabetes mellitus. Will check a hemoglobin A1c and urine for microalbumin Hypertension stable Dyslipidemia  CPX 3 months  Infected sebaceous cyst right neck  Procedure note.  After a Betadine and alcohol prep, patient anesthetized with 1% Xylocaine. A small incision was performed an infected cyst drained. Blunt dissection was undertaken and the lesion decompressed. Antibiotic ointment applied and the wound dressed

## 2013-02-13 LAB — MICROALBUMIN / CREATININE URINE RATIO
Creatinine, Urine: 233.4 mg/dL
Microalb Creat Ratio: 12.4 mg/g (ref 0.0–30.0)

## 2013-04-22 ENCOUNTER — Other Ambulatory Visit: Payer: Self-pay

## 2013-04-26 ENCOUNTER — Other Ambulatory Visit: Payer: Self-pay | Admitting: Internal Medicine

## 2013-07-15 ENCOUNTER — Other Ambulatory Visit: Payer: Self-pay | Admitting: Internal Medicine

## 2013-07-30 ENCOUNTER — Other Ambulatory Visit: Payer: Self-pay | Admitting: Internal Medicine

## 2013-09-28 ENCOUNTER — Telehealth: Payer: Self-pay | Admitting: Internal Medicine

## 2013-09-28 MED ORDER — METFORMIN HCL 1000 MG PO TABS: ORAL_TABLET | ORAL | Status: DC

## 2013-09-28 NOTE — Telephone Encounter (Signed)
[facility] - [place], [place] - [address] IS REQUESTING RE-FILL ONmetFORMIN (GLUCOPHAGE) 1000 MG tablet

## 2013-09-28 NOTE — Telephone Encounter (Signed)
Rx sent 

## 2013-10-06 ENCOUNTER — Telehealth: Payer: Self-pay | Admitting: Internal Medicine

## 2013-10-06 MED ORDER — PRAVASTATIN SODIUM 40 MG PO TABS: ORAL_TABLET | ORAL | Status: DC

## 2013-10-06 NOTE — Telephone Encounter (Signed)
Rx sent to pharmacy   

## 2013-10-06 NOTE — Telephone Encounter (Signed)
[facility] - [place], [place] - [address] Requesting re-fill on pravastatin (PRAVACHOL) 40 MG tablet

## 2013-10-08 ENCOUNTER — Ambulatory Visit (INDEPENDENT_AMBULATORY_CARE_PROVIDER_SITE_OTHER): Payer: 59 | Admitting: Internal Medicine

## 2013-10-08 ENCOUNTER — Encounter: Payer: Self-pay | Admitting: Internal Medicine

## 2013-10-08 VITALS — BP 138/86 | HR 76 | Temp 99.1°F | Resp 20 | Ht 72.0 in | Wt 246.0 lb

## 2013-10-08 DIAGNOSIS — E785 Hyperlipidemia, unspecified: Secondary | ICD-10-CM

## 2013-10-08 DIAGNOSIS — E119 Type 2 diabetes mellitus without complications: Secondary | ICD-10-CM

## 2013-10-08 DIAGNOSIS — I10 Essential (primary) hypertension: Secondary | ICD-10-CM

## 2013-10-08 LAB — CBC WITH DIFFERENTIAL/PLATELET
Basophils Absolute: 0 10*3/uL (ref 0.0–0.1)
Basophils Relative: 0.4 % (ref 0.0–3.0)
EOS ABS: 0.1 10*3/uL (ref 0.0–0.7)
Eosinophils Relative: 2.6 % (ref 0.0–5.0)
HCT: 43.2 % (ref 39.0–52.0)
HEMOGLOBIN: 14 g/dL (ref 13.0–17.0)
Lymphocytes Relative: 54.2 % — ABNORMAL HIGH (ref 12.0–46.0)
Lymphs Abs: 2.3 10*3/uL (ref 0.7–4.0)
MCHC: 32.5 g/dL (ref 30.0–36.0)
MCV: 74.3 fl — AB (ref 78.0–100.0)
MONO ABS: 0.2 10*3/uL (ref 0.1–1.0)
Monocytes Relative: 4.7 % (ref 3.0–12.0)
NEUTROS ABS: 1.6 10*3/uL (ref 1.4–7.7)
Neutrophils Relative %: 38.1 % — ABNORMAL LOW (ref 43.0–77.0)
Platelets: 320 10*3/uL (ref 150.0–400.0)
RBC: 5.81 Mil/uL (ref 4.22–5.81)
RDW: 13.6 % (ref 11.5–14.6)
WBC: 4.3 10*3/uL — ABNORMAL LOW (ref 4.5–10.5)

## 2013-10-08 LAB — LIPID PANEL
CHOLESTEROL: 172 mg/dL (ref 0–200)
HDL: 45.6 mg/dL (ref >39.00)
LDL CALC: 110 mg/dL — AB (ref 0–99)
Total CHOL/HDL Ratio: 4
Triglycerides: 83 mg/dL (ref 0.0–149.0)
VLDL: 16.6 mg/dL (ref 0.0–40.0)

## 2013-10-08 LAB — COMPREHENSIVE METABOLIC PANEL
ALK PHOS: 41 U/L (ref 39–117)
ALT: 36 U/L (ref 0–53)
AST: 26 U/L (ref 0–37)
Albumin: 4.7 g/dL (ref 3.5–5.2)
BILIRUBIN TOTAL: 0.6 mg/dL (ref 0.3–1.2)
BUN: 17 mg/dL (ref 6–23)
CO2: 28 mEq/L (ref 19–32)
CREATININE: 1 mg/dL (ref 0.4–1.5)
Calcium: 10.3 mg/dL (ref 8.4–10.5)
Chloride: 99 mEq/L (ref 96–112)
GFR: 110.32 mL/min (ref >60.00)
Glucose, Bld: 112 mg/dL — ABNORMAL HIGH (ref 70–99)
POTASSIUM: 4.1 meq/L (ref 3.5–5.1)
Sodium: 137 mEq/L (ref 135–145)
Total Protein: 8 g/dL (ref 6.0–8.3)

## 2013-10-08 LAB — TSH: TSH: 1.27 u[IU]/mL (ref 0.35–5.50)

## 2013-10-08 LAB — HEMOGLOBIN A1C: Hgb A1c MFr Bld: 6.5 % (ref 4.6–6.5)

## 2013-10-08 NOTE — Progress Notes (Signed)
Subjective:    Patient ID: Gaston IslamErnest H Bateson Jr., male    DOB: 1976/01/05, 38 y.o.   MRN: [id number]  HPI  38 year old patient who has a history of hypertension, dyslipidemia, and diabetes.  For the past 2 weeks.  He has had some significant reflux symptoms.  He has had similar symptoms in the past have responded well to Nexium.  Also, over this period of time.  He has had some occasional palpitations..  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  No other associated symptoms.  No significant caffeine or stimulant use He has type 2 diabetes, which has been well-controlled per his last hemoglobin A1c was 6 point 4.  He has treated hypertension and remains on pravastatin.  He states a number of years ago due to exertional chest pain, and an abnormal Myoview.  He had a heart catheterization.  This was performed with Dr. Allyson SabalBerry and was normal  Past Medical History  Diagnosis Date  . Diabetes mellitus   . Hyperlipidemia   . Hypertension   . Sinusitis     History   Social History  . Marital Status: Married    Spouse Name: N/A    Number of Children: N/A  . Years of Education: N/A   Occupational History  . Not on file.   Social History Main Topics  . Smoking status: Never Smoker   . Smokeless tobacco: Never Used  . Alcohol Use: Yes     Comment: occ.   . Drug Use: No  . Sexual Activity: Not on file   Other Topics Concern  . Not on file   Social History Narrative  . No narrative on file    Past Surgical History  Procedure Laterality Date  . Epidural injections   2002    No family history on file.  Allergies  Allergen Reactions  . Demerol Itching    Current Outpatient Prescriptions on File Prior to Visit  Medication Sig Dispense Refill  . B Complex Vitamins (B-COMPLEX/B-12 SL) Place 1 tablet under the tongue daily.      . Blood Glucose Calibration (ACCU-CHEK INSTANT CONTROL) LIQD 1 each by In Vitro route 2 (two) times a week.  1 each  0    . Esomeprazole Magnesium (NEXIUM PO) Take 20 mg by mouth daily.      Marland Kitchen. GLIPIZIDE XL 5 MG 24 hr tablet TAKE 1 TABLET BY MOUTH ONCE DAILY  90 tablet  PRN  . glucose blood (ACCU-CHEK COMPACT TEST DRUM) test strip 1 each by Other route daily. Use as instructed  100 each  12  . losartan-hydrochlorothiazide (HYZAAR) 100-25 MG per tablet TAKE 1 TABLET BY MOUTH ONCE DAILY  90 tablet  1  . metFORMIN (GLUCOPHAGE) 1000 MG tablet TAKE 1 TABLET BY MOUTH TWICE DAILY WITH A MEAL  180 tablet  0  . OVER THE COUNTER MEDICATION Throat lozengers      . pravastatin (PRAVACHOL) 40 MG tablet TAKE 1 TABLET BY MOUTH ONCE DAILY  90 tablet  0  . fluticasone (FLONASE) 50 MCG/ACT nasal spray Place 2 sprays into the nose daily.  16 g  0   No current facility-administered medications on file prior to visit.    BP 138/86  Pulse 76  Temp(Src) 99.1 F (37.3 C) (Oral)  Resp 20  Ht 6' (1.829 m)  Wt 246 lb (111.585 kg)  BMI 33.36 kg/m2  SpO2 98%       Review  of Systems  Constitutional: Negative for fever, chills, appetite change and fatigue.  HENT: Negative for congestion, dental problem, ear pain, hearing loss, sore throat, tinnitus, trouble swallowing and voice change.   Eyes: Negative for pain, discharge and visual disturbance.  Respiratory: Negative for cough, chest tightness, wheezing and stridor.   Cardiovascular: Positive for palpitations. Negative for chest pain and leg swelling.  Gastrointestinal: Positive for abdominal pain. Negative for nausea, vomiting, diarrhea, constipation, blood in stool and abdominal distention.  Genitourinary: Negative for urgency, hematuria, flank pain, discharge, difficulty urinating and genital sores.  Musculoskeletal: Negative for arthralgias, back pain, gait problem, joint swelling, myalgias and neck stiffness.  Skin: Negative for rash.  Neurological: Negative for dizziness, syncope, speech difficulty, weakness, numbness and headaches.  Hematological: Negative for  adenopathy. Does not bruise/bleed easily.  Psychiatric/Behavioral: Negative for behavioral problems and dysphoric mood. The patient is not nervous/anxious.        Objective:   Physical Exam  Constitutional: He is oriented to person, place, and time. He appears well-developed.  Weight 2 4 6  Blood pressure 130/80  HENT:  Head: Normocephalic.  Right Ear: External ear normal.  Left Ear: External ear normal.  Eyes: Conjunctivae and EOM are normal.  Neck: Normal range of motion.  Cardiovascular: Normal rate and normal heart sounds.   Rare ectopics No murmur  Pulmonary/Chest: Breath sounds normal.  Abdominal: Bowel sounds are normal.  Musculoskeletal: Normal range of motion. He exhibits no edema and no tenderness.  Neurological: He is alert and oriented to person, place, and time.  Psychiatric: He has a normal mood and affect. His behavior is normal.          Assessment & Plan:   Palpitations.  Patient reassured.  Will observe Significant GERD.  Antireflux regimen initiated.  We'll place on short-term PPI therapy Diabetes mellitus.  We'll check a hemoglobin A1c Hypertension controlled Dyslipidemia  Laboratory update reviewed Recheck 6 months Additional weight loss recommended Regular exercise program.  Encouraged

## 2013-10-08 NOTE — Progress Notes (Signed)
Pre-visit discussion using our clinic review tool. No additional management support is needed unless otherwise documented below in the visit note.  

## 2013-10-08 NOTE — Patient Instructions (Addendum)
Avoids foods high in acid such as tomatoes citrus juices, and spicy foods.  Avoid eating within two hours of lying down or before exercising.  Do not overheat.  Try smaller more frequent meals.  If symptoms persist, elevate the head of her bed 12 inches while sleeping.Gastroesophageal Reflux Disease, Adult Gastroesophageal reflux disease (GERD) happens when acid from your stomach goes into your food pipe (esophagus). The acid can cause a burning feeling in your chest. Over time, the acid can make small holes (ulcers) in your food pipe.  HOME CARE  Ask your doctor for advice about:  Losing weight.  Quitting smoking.  Alcohol use.  Avoid foods and drinks that make your problems worse. You may want to avoid:  Caffeine and alcohol.  Chocolate.  Mints.  Garlic and onions.  Spicy foods.  Citrus fruits, such as oranges, lemons, or limes.  Foods that contain tomato, such as sauce, chili, salsa, and pizza.  Fried and fatty foods.  Avoid lying down for 3 hours before you go to bed or before you take a nap.  Eat small meals often, instead of large meals.  Wear loose-fitting clothing. Do not wear anything tight around your waist.  Raise (elevate) the head of your bed 6 to 8 inches with wood blocks. Using extra pillows does not help.  Only take medicines as told by your doctor.  Do not take aspirin or ibuprofen. GET HELP RIGHT AWAY IF:   You have pain in your arms, neck, jaw, teeth, or back.  Your pain gets worse or changes.  You feel sick to your stomach (nauseous), throw up (vomit), or sweat (diaphoresis).  You feel short of breath, or you pass out (faint).  Your throw up is green, yellow, black, or looks like coffee grounds or blood.  Your poop (stool) is red, bloody, or black. MAKE SURE YOU:   Understand these instructions.  Will watch your condition.  Will get help right away if you are not doing well or get worse. Document Released: 11/20/2007 Document Revised:  08/26/2011 Document Reviewed: 12/21/2010 [facility] Patient Information 2014 [place], [place].   Limit your sodium (Salt) intake   Please check your hemoglobin A1c every 3 months  You need to lose weight.  Consider a lower calorie diet and regular exercise.    It is important that you exercise regularly, at least 20 minutes 3 to 4 times per week.  If you develop chest pain or shortness of breath seek  medical attention.

## 2013-10-11 ENCOUNTER — Telehealth: Payer: Self-pay | Admitting: Internal Medicine

## 2013-10-11 NOTE — Telephone Encounter (Signed)
Relevant patient education assigned to patient using Emmi. ° °

## 2013-10-13 ENCOUNTER — Telehealth: Payer: Self-pay | Admitting: Internal Medicine

## 2013-10-13 NOTE — Telephone Encounter (Signed)
Spoke to pt told him I released lab results so he should be able to see them. Lab results okay, except LDL as little high, Hemoglobin A1c was 6.5. Pt verbalized understanding and said will work on LDL.

## 2013-10-13 NOTE — Telephone Encounter (Signed)
Pt would like results of labs released to my chart so he can view. Results were back on 4/24,but have not been released yet.

## 2013-11-02 ENCOUNTER — Telehealth: Payer: Self-pay | Admitting: Internal Medicine

## 2013-11-02 MED ORDER — DEXLANSOPRAZOLE 60 MG PO CPDR: 60.0000 mg | DELAYED_RELEASE_CAPSULE | Freq: Every day | ORAL | Status: DC

## 2013-11-02 NOTE — Telephone Encounter (Signed)
Left detailed message Rx sent to pharmacy as requested. 

## 2013-11-02 NOTE — Telephone Encounter (Signed)
Pt was given dexilant 60 mg for his acid reflux,  pt states it has helped very much has pt request a rx for this med. 90 day to [facility]

## 2014-01-17 ENCOUNTER — Other Ambulatory Visit: Payer: Self-pay | Admitting: Internal Medicine

## 2014-04-01 ENCOUNTER — Other Ambulatory Visit: Payer: Self-pay

## 2014-04-25 ENCOUNTER — Other Ambulatory Visit: Payer: Self-pay | Admitting: Internal Medicine

## 2014-05-09 ENCOUNTER — Ambulatory Visit: Payer: 59 | Admitting: Internal Medicine

## 2014-06-09 ENCOUNTER — Ambulatory Visit (INDEPENDENT_AMBULATORY_CARE_PROVIDER_SITE_OTHER): Payer: 59 | Admitting: Internal Medicine

## 2014-06-09 ENCOUNTER — Encounter: Payer: Self-pay | Admitting: Internal Medicine

## 2014-06-09 VITALS — BP 138/90 | HR 80 | Temp 98.5°F | Resp 20 | Ht 72.0 in | Wt 246.0 lb

## 2014-06-09 DIAGNOSIS — E109 Type 1 diabetes mellitus without complications: Secondary | ICD-10-CM

## 2014-06-09 DIAGNOSIS — E785 Hyperlipidemia, unspecified: Secondary | ICD-10-CM

## 2014-06-09 LAB — MICROALBUMIN / CREATININE URINE RATIO
Creatinine,U: 328.4 mg/dL
MICROALB/CREAT RATIO: 2 mg/g (ref 0.0–30.0)
Microalb, Ur: 6.5 mg/dL — ABNORMAL HIGH (ref 0.0–1.9)

## 2014-06-09 LAB — LIPID PANEL
Cholesterol: 189 mg/dL (ref 0–200)
HDL: 41.7 mg/dL (ref >39.00)
LDL Cholesterol: 119 mg/dL — ABNORMAL HIGH (ref 0–99)
NONHDL: 147.3
Total CHOL/HDL Ratio: 5
Triglycerides: 142 mg/dL (ref 0.0–149.0)
VLDL: 28.4 mg/dL (ref 0.0–40.0)

## 2014-06-09 LAB — HEMOGLOBIN A1C: Hgb A1c MFr Bld: 7.9 % — ABNORMAL HIGH (ref 4.6–6.5)

## 2014-06-09 NOTE — Patient Instructions (Signed)
Limit your sodium (Salt) intake    It is important that you exercise regularly, at least 20 minutes 3 to 4 times per week.  If you develop chest pain or shortness of breath seek  medical attention.  Return in 6 months for follow-up  

## 2014-06-09 NOTE — Progress Notes (Signed)
Pre visit review using our clinic review tool, if applicable. No additional management support is needed unless otherwise documented below in the visit note. 

## 2014-06-09 NOTE — Progress Notes (Signed)
Subjective:    Patient ID: Gaston IslamErnest H Mulka Jr., male    DOB: 05/22/1976, 38 y.o.   MRN: [id number]  HPI  38 year old patient who has type 2 diabetes without complications who is seen today for his biannual follow-up.  Hemoglobin A1c's have generally been very well controlled.  Presently he is on metformin and reduced dose of 1000 mg once daily due to some dyspepsia.  He has done well on this regimen. Remains on statin therapy, which she continues to tolerate.  He has hypertension which has been well controlled.  He does monitor blood pressure readings at home, but no home blood sugar monitoring.  Past Medical History  Diagnosis Date  . Diabetes mellitus   . Hyperlipidemia   . Hypertension   . Sinusitis     History   Social History  . Marital Status: Married    Spouse Name: N/A    Number of Children: N/A  . Years of Education: N/A   Occupational History  . Not on file.   Social History Main Topics  . Smoking status: Never Smoker   . Smokeless tobacco: Never Used  . Alcohol Use: Yes     Comment: occ.   . Drug Use: No  . Sexual Activity: Not on file   Other Topics Concern  . Not on file   Social History Narrative    Past Surgical History  Procedure Laterality Date  . Epidural injections   2002    No family history on file.  Allergies  Allergen Reactions  . Demerol Itching    Current Outpatient Prescriptions on File Prior to Visit  Medication Sig Dispense Refill  . Blood Glucose Calibration (ACCU-CHEK INSTANT CONTROL) LIQD 1 each by In Vitro route 2 (two) times a week. 1 each 0  . DEXILANT 60 MG capsule TAKE 1 CAPSULE BY MOUTH DAILY. 90 capsule 0  . GLIPIZIDE XL 5 MG 24 hr tablet TAKE 1 TABLET BY MOUTH ONCE DAILY 90 tablet PRN  . glucose blood (ACCU-CHEK COMPACT TEST DRUM) test strip 1 each by Other route daily. Use as instructed 100 each 12  . losartan-hydrochlorothiazide (HYZAAR) 100-25 MG per tablet TAKE 1 TABLET BY MOUTH ONCE DAILY 90 tablet 1  . metFORMIN  (GLUCOPHAGE) 1000 MG tablet TAKE 1 TABLET BY MOUTH TWICE DAILY WITH A MEAL (Patient taking differently: TAKE 1 TABLET BY MOUTH  DAILY WITH A MEAL) 180 tablet 0  . OVER THE COUNTER MEDICATION as needed. Throat lozengers    . pravastatin (PRAVACHOL) 40 MG tablet TAKE 1 TABLET BY MOUTH ONCE DAILY 90 tablet 0  . fluticasone (FLONASE) 50 MCG/ACT nasal spray Place 2 sprays into the nose daily. 16 g 0   No current facility-administered medications on file prior to visit.    BP 138/90 mmHg  Pulse 80  Temp(Src) 98.5 F (36.9 C) (Oral)  Resp 20  Ht 6' (1.829 m)  Wt 246 lb (111.585 kg)  BMI 33.36 kg/m2  SpO2 98%    Review of Systems  Constitutional: Negative for fever, chills, appetite change and fatigue.  HENT: Negative for congestion, dental problem, ear pain, hearing loss, sore throat, tinnitus, trouble swallowing and voice change.   Eyes: Negative for pain, discharge and visual disturbance.  Respiratory: Negative for cough, chest tightness, wheezing and stridor.   Cardiovascular: Negative for chest pain, palpitations and leg swelling.  Gastrointestinal: Negative for nausea, vomiting, abdominal pain, diarrhea, constipation, blood in stool and abdominal distention.  Genitourinary: Negative for urgency, hematuria, flank pain,  discharge, difficulty urinating and genital sores.  Musculoskeletal: Negative for myalgias, back pain, joint swelling, arthralgias, gait problem and neck stiffness.  Skin: Negative for rash.  Neurological: Negative for dizziness, syncope, speech difficulty, weakness, numbness and headaches.  Hematological: Negative for adenopathy. Does not bruise/bleed easily.  Psychiatric/Behavioral: Negative for behavioral problems and dysphoric mood. The patient is not nervous/anxious.        Objective:   Physical Exam  Constitutional: He is oriented to person, place, and time. He appears well-developed.  Blood pressure 140/84  HENT:  Head: Normocephalic.  Right Ear: External  ear normal.  Left Ear: External ear normal.  Eyes: Conjunctivae and EOM are normal.  Neck: Normal range of motion.  Cardiovascular: Normal rate and normal heart sounds.   Pulmonary/Chest: Breath sounds normal.  Abdominal: Bowel sounds are normal.  Musculoskeletal: Normal range of motion. He exhibits no edema or tenderness.  Neurological: He is alert and oriented to person, place, and time.  Psychiatric: He has a normal mood and affect. His behavior is normal.          Assessment & Plan:   Diabetes mellitus.  Will check a hemoglobin A1c and urine for microalbumin Hypertension.  Continue present regimen Dyslipidemia.  Continue statin therapy  Exercise encouraged Home blood sugar and blood pressure monitoring recommended Recheck 6 months

## 2014-06-13 ENCOUNTER — Other Ambulatory Visit: Payer: Self-pay | Admitting: *Deleted

## 2014-06-13 NOTE — Telephone Encounter (Signed)
Error

## 2014-06-16 ENCOUNTER — Other Ambulatory Visit: Payer: Self-pay | Admitting: Internal Medicine

## 2014-07-25 ENCOUNTER — Other Ambulatory Visit: Payer: Self-pay | Admitting: Internal Medicine

## 2014-10-21 ENCOUNTER — Other Ambulatory Visit: Payer: Self-pay | Admitting: Internal Medicine

## 2014-10-27 ENCOUNTER — Encounter: Payer: Self-pay | Admitting: Internal Medicine

## 2014-10-27 ENCOUNTER — Ambulatory Visit (INDEPENDENT_AMBULATORY_CARE_PROVIDER_SITE_OTHER): Payer: 59 | Admitting: Internal Medicine

## 2014-10-27 VITALS — BP 160/100 | HR 74 | Temp 98.5°F | Resp 20 | Ht 72.0 in | Wt 240.0 lb

## 2014-10-27 DIAGNOSIS — E119 Type 2 diabetes mellitus without complications: Secondary | ICD-10-CM | POA: Diagnosis not present

## 2014-10-27 DIAGNOSIS — I10 Essential (primary) hypertension: Secondary | ICD-10-CM | POA: Diagnosis not present

## 2014-10-27 DIAGNOSIS — E785 Hyperlipidemia, unspecified: Secondary | ICD-10-CM | POA: Diagnosis not present

## 2014-10-27 MED ORDER — CANAGLIFLOZIN 300 MG PO TABS: 300.0000 mg | ORAL_TABLET | Freq: Every day | ORAL | Status: DC

## 2014-10-27 MED ORDER — PIOGLITAZONE HCL 30 MG PO TABS: 30.0000 mg | ORAL_TABLET | Freq: Every day | ORAL | Status: DC

## 2014-10-27 MED ORDER — AMLODIPINE BESYLATE 5 MG PO TABS: 5.0000 mg | ORAL_TABLET | Freq: Every day | ORAL | Status: DC

## 2014-10-27 NOTE — Patient Instructions (Signed)
Limit your sodium (Salt) intake   Please check your hemoglobin A1c every 3 months  Please check your blood pressure on a regular basis.  If it is consistently greater than 150/90, please make an office appointment.  Return in 3 months for follow-up  

## 2014-10-27 NOTE — Progress Notes (Signed)
Subjective:    Patient ID: Reginald IslamErnest H Pelley Jr., male    DOB: 11-22-1975, 39 y.o.   MRN: [id number]  HPI Lab Results  Component Value Date   HGBA1C 7.9* 06/09/2014    BP Readings from Last 3 Encounters:  10/27/14 160/100  06/09/14 138/90  10/08/13 138/86   Wt Readings from Last 3 Encounters:  10/27/14 240 lb (108.863 kg)  06/09/14 246 lb (111.585 kg)  10/08/13 246 lb (111.19585 kg)   39 year old patient who has treated hypertension as well as type 2 diabetes.  Last hemoglobin A1c was not well controlled.  He has decrease metformin to 1000 mg daily due to GI intolerance.  Remains on glipizide.  He has rare blood sugars less than 100 but often greater than 200.  He generally feels well  Past Medical History  Diagnosis Date  . Diabetes mellitus   . Hyperlipidemia   . Hypertension   . Sinusitis     History   Social History  . Marital Status: Married    Spouse Name: N/A  . Number of Children: N/A  . Years of Education: N/A   Occupational History  . Not on file.   Social History Main Topics  . Smoking status: Never Smoker   . Smokeless tobacco: Never Used  . Alcohol Use: Yes     Comment: occ.   . Drug Use: No  . Sexual Activity: Not on file   Other Topics Concern  . Not on file   Social History Narrative    Past Surgical History  Procedure Laterality Date  . Epidural injections   2002    No family history on file.  Allergies  Allergen Reactions  . Demerol Itching    Current Outpatient Prescriptions on File Prior to Visit  Medication Sig Dispense Refill  . Blood Glucose Calibration (ACCU-CHEK INSTANT CONTROL) LIQD 1 each by In Vitro route 2 (two) times a week. 1 each 0  . dexlansoprazole (DEXILANT) 60 MG capsule Take 1 capsule (60 mg total) by mouth daily. 90 capsule 1  . glipiZIDE (GLUCOTROL XL) 5 MG 24 hr tablet TAKE 1 TABLET BY MOUTH ONCE DAILY 90 tablet 1  . glucose blood (ACCU-CHEK COMPACT TEST DRUM) test strip 1 each by Other route daily. Use as  instructed 100 each 12  . losartan-hydrochlorothiazide (HYZAAR) 100-25 MG per tablet TAKE 1 TABLET BY MOUTH ONCE DAILY 90 tablet 1  . losartan-hydrochlorothiazide (HYZAAR) 100-25 MG per tablet Take 1 tablet by mouth daily. 90 tablet 1  . metFORMIN (GLUCOPHAGE) 1000 MG tablet TAKE 1 TABLET BY MOUTH TWICE DAILY WITH A MEAL (Patient taking differently: TAKE 1 TABLET BY MOUTH  DAILY WITH A MEAL) 180 tablet 0  . pravastatin (PRAVACHOL) 40 MG tablet TAKE 1 TABLET BY MOUTH ONCE DAILY 90 tablet 0   No current facility-administered medications on file prior to visit.    BP 160/100 mmHg  Pulse 74  Temp(Src) 98.5 F (36.9 C) (Oral)  Resp 20  Ht 6' (1.829 m)  Wt 240 lb (108.863 kg)  BMI 32.54 kg/m2  SpO2 98%     Review of Systems  Constitutional: Negative for fever, chills, appetite change and fatigue.  HENT: Negative for congestion, dental problem, ear pain, hearing loss, sore throat, tinnitus, trouble swallowing and voice change.   Eyes: Negative for pain, discharge and visual disturbance.  Respiratory: Negative for cough, chest tightness, wheezing and stridor.   Cardiovascular: Negative for chest pain, palpitations and leg swelling.  Gastrointestinal: Negative for nausea,  vomiting, abdominal pain, diarrhea, constipation, blood in stool and abdominal distention.  Genitourinary: Negative for urgency, hematuria, flank pain, discharge, difficulty urinating and genital sores.  Musculoskeletal: Negative for myalgias, back pain, joint swelling, arthralgias, gait problem and neck stiffness.  Skin: Negative for rash.  Neurological: Negative for dizziness, syncope, speech difficulty, weakness, numbness and headaches.  Hematological: Negative for adenopathy. Does not bruise/bleed easily.  Psychiatric/Behavioral: Negative for behavioral problems and dysphoric mood. The patient is not nervous/anxious.        Objective:   Physical Exam  Constitutional: He is oriented to person, place, and time. He  appears well-developed.  Blood pressure 150/90  HENT:  Head: Normocephalic.  Right Ear: External ear normal.  Left Ear: External ear normal.  Eyes: Conjunctivae and EOM are normal.  Neck: Normal range of motion.  Cardiovascular: Normal rate and normal heart sounds.   Pulmonary/Chest: Breath sounds normal.  Abdominal: Bowel sounds are normal.  Musculoskeletal: Normal range of motion. He exhibits no edema or tenderness.  Neurological: He is alert and oriented to person, place, and time.  Psychiatric: He has a normal mood and affect. His behavior is normal.          Assessment & Plan:   Hypertension.  Blood pressure is elevated today and has been trending up over the past 2 or 3 weeks.  Will add amlodipine 5 mg daily to his regimen Diabetes mellitus.  Suboptimal control.  Patient unable to tolerate full metformin dose will add pioglitazone 30 mg daily as well as Invokanna.  Samples of 100 mg given for 2 weeks and then we'll start on a 300 mg daily dose.  We'll defer hemoglobin A1c today, but will check in 3 months  Home blood pressure monitoring.  Encouraged  I examination in June as scheduled 4

## 2014-10-27 NOTE — Progress Notes (Signed)
Pre visit review using our clinic review tool, if applicable. No additional management support is needed unless otherwise documented below in the visit note. 

## 2014-10-28 ENCOUNTER — Other Ambulatory Visit: Payer: Self-pay | Admitting: *Deleted

## 2014-10-28 MED ORDER — GLUCOSE BLOOD VI STRP: 1.0000 | ORAL_STRIP | Freq: Three times a day (TID) | Status: DC

## 2014-11-29 ENCOUNTER — Other Ambulatory Visit: Payer: Self-pay

## 2014-11-29 MED ORDER — METFORMIN HCL 1000 MG PO TABS: ORAL_TABLET | ORAL | Status: DC

## 2014-11-29 NOTE — Telephone Encounter (Signed)
Dr. Kirtland Bouchard refused the refill of this med.

## 2014-11-29 NOTE — Telephone Encounter (Signed)
Is this okay to refill.  He has appt. In July.

## 2014-12-28 ENCOUNTER — Ambulatory Visit: Payer: 59 | Admitting: Internal Medicine

## 2014-12-29 ENCOUNTER — Encounter: Payer: Self-pay | Admitting: Internal Medicine

## 2014-12-29 ENCOUNTER — Ambulatory Visit (INDEPENDENT_AMBULATORY_CARE_PROVIDER_SITE_OTHER): Payer: 59 | Admitting: Internal Medicine

## 2014-12-29 VITALS — BP 120/76 | HR 83 | Temp 98.7°F | Resp 20 | Ht 72.0 in | Wt 244.0 lb

## 2014-12-29 DIAGNOSIS — E119 Type 2 diabetes mellitus without complications: Secondary | ICD-10-CM

## 2014-12-29 DIAGNOSIS — I10 Essential (primary) hypertension: Secondary | ICD-10-CM

## 2014-12-29 DIAGNOSIS — E785 Hyperlipidemia, unspecified: Secondary | ICD-10-CM

## 2014-12-29 LAB — CBC WITH DIFFERENTIAL/PLATELET
BASOS ABS: 0 10*3/uL (ref 0.0–0.1)
BASOS PCT: 0.3 % (ref 0.0–3.0)
EOS ABS: 0 10*3/uL (ref 0.0–0.7)
Eosinophils Relative: 0.8 % (ref 0.0–5.0)
HEMATOCRIT: 44.6 % (ref 39.0–52.0)
HEMOGLOBIN: 14.3 g/dL (ref 13.0–17.0)
LYMPHS ABS: 2.2 10*3/uL (ref 0.7–4.0)
LYMPHS PCT: 45.4 % (ref 12.0–46.0)
MCHC: 32.1 g/dL (ref 30.0–36.0)
MCV: 74.2 fl — ABNORMAL LOW (ref 78.0–100.0)
MONOS PCT: 5.2 % (ref 3.0–12.0)
Monocytes Absolute: 0.2 10*3/uL (ref 0.1–1.0)
NEUTROS ABS: 2.3 10*3/uL (ref 1.4–7.7)
Neutrophils Relative %: 48.3 % (ref 43.0–77.0)
PLATELETS: 325 10*3/uL (ref 150.0–400.0)
RBC: 6.01 Mil/uL — AB (ref 4.22–5.81)
RDW: 14.4 % (ref 11.5–15.5)
WBC: 4.8 10*3/uL (ref 4.0–10.5)

## 2014-12-29 LAB — COMPREHENSIVE METABOLIC PANEL
ALBUMIN: 4.6 g/dL (ref 3.5–5.2)
ALT: 37 U/L (ref 0–53)
AST: 24 U/L (ref 0–37)
Alkaline Phosphatase: 52 U/L (ref 39–117)
BUN: 15 mg/dL (ref 6–23)
CO2: 32 mEq/L (ref 19–32)
Calcium: 9.8 mg/dL (ref 8.4–10.5)
Chloride: 100 mEq/L (ref 96–112)
Creatinine, Ser: 1 mg/dL (ref 0.40–1.50)
GFR: 107.08 mL/min (ref >60.00)
Glucose, Bld: 85 mg/dL (ref 70–99)
POTASSIUM: 3.5 meq/L (ref 3.5–5.1)
Sodium: 139 mEq/L (ref 135–145)
Total Bilirubin: 0.5 mg/dL (ref 0.2–1.2)
Total Protein: 7.9 g/dL (ref 6.0–8.3)

## 2014-12-29 LAB — HEMOGLOBIN A1C: HEMOGLOBIN A1C: 6.6 % — AB (ref 4.6–6.5)

## 2014-12-29 LAB — TSH: TSH: 1.77 u[IU]/mL (ref 0.35–4.50)

## 2014-12-29 NOTE — Patient Instructions (Signed)
Limit your sodium (Salt) intake    It is important that you exercise regularly, at least 20 minutes 3 to 4 times per week.  If you develop chest pain or shortness of breath seek  medical attention.   Please check your hemoglobin A1c every 3 months   

## 2014-12-29 NOTE — Progress Notes (Signed)
Subjective:    Patient ID: Reginald Kirby., male    DOB: Oct 06, 1975, 39 y.o.   MRN: [id number]  HPI  Lab Results  Component Value Date   HGBA1C 7.9* 06/09/2014    BP Readings from Last 3 Encounters:  12/29/14 120/76  10/27/14 160/100  06/09/14 138/90    Wt Readings from Last 3 Encounters:  12/29/14 244 lb (110.678 kg)  10/27/14 240 lb (108.863 kg)  06/09/14 246 lb (111.69 kg)   39 year old patient who is seen today for follow-up.  2 months ago.  He is diabetic and antihypertensive regimen were intensified.  Blood sugars have done much better.  Fasting blood sugars generally range between 101 40 in the morning.  Late afternoon blood sugars are generally less than 100.  He has had only one very mild hypoglycemic episode with a blood sugar of 68.  He felt nauseated and slightly dizzy at that time. Generally doing quite well.  Past Medical History  Diagnosis Date  . Diabetes mellitus   . Hyperlipidemia   . Hypertension   . Sinusitis     History   Social History  . Marital Status: Married    Spouse Name: N/A  . Number of Children: N/A  . Years of Education: N/A   Occupational History  . Not on file.   Social History Main Topics  . Smoking status: Never Smoker   . Smokeless tobacco: Never Used  . Alcohol Use: Yes     Comment: occ.   . Drug Use: No  . Sexual Activity: Not on file   Other Topics Concern  . Not on file   Social History Narrative    Past Surgical History  Procedure Laterality Date  . Epidural injections   2002    No family history on file.  Allergies  Allergen Reactions  . Demerol Itching    Current Outpatient Prescriptions on File Prior to Visit  Medication Sig Dispense Refill  . amLODipine (NORVASC) 5 MG tablet Take 1 tablet (5 mg total) by mouth daily. 90 tablet 3  . Blood Glucose Calibration (ACCU-CHEK INSTANT CONTROL) LIQD 1 each by In Vitro route 2 (two) times a week. 1 each 0  . canagliflozin (INVOKANA) 300 MG TABS tablet  Take 300 mg by mouth daily before breakfast. 30 tablet 4  . dexlansoprazole (DEXILANT) 60 MG capsule Take 1 capsule (60 mg total) by mouth daily. 90 capsule 1  . glipiZIDE (GLUCOTROL XL) 5 MG 24 hr tablet TAKE 1 TABLET BY MOUTH ONCE DAILY 90 tablet 1  . glucose blood (FREESTYLE LITE) test strip 1 each by Other route 3 (three) times daily. Use as instructed 300 each 3  . losartan-hydrochlorothiazide (HYZAAR) 100-25 MG per tablet TAKE 1 TABLET BY MOUTH ONCE DAILY 90 tablet 1  . losartan-hydrochlorothiazide (HYZAAR) 100-25 MG per tablet Take 1 tablet by mouth daily. 90 tablet 1  . metFORMIN (GLUCOPHAGE) 1000 MG tablet TAKE 1 TABLET BY MOUTH  DAILY WITH A MEAL 180 tablet 0  . pioglitazone (ACTOS) 30 MG tablet Take 1 tablet (30 mg total) by mouth daily. 90 tablet 2  . pravastatin (PRAVACHOL) 40 MG tablet TAKE 1 TABLET BY MOUTH ONCE DAILY 90 tablet 0   No current facility-administered medications on file prior to visit.    BP 120/76 mmHg  Pulse 83  Temp(Src) 98.7 F (37.1 C) (Oral)  Resp 20  Ht 6' (1.829 m)  Wt 244 lb (110.678 kg)  BMI 33.09 kg/m2  SpO2 98%  Review of Systems  Constitutional: Negative for fever, chills, appetite change and fatigue.  HENT: Negative for congestion, dental problem, ear pain, hearing loss, sore throat, tinnitus, trouble swallowing and voice change.   Eyes: Negative for pain, discharge and visual disturbance.  Respiratory: Negative for cough, chest tightness, wheezing and stridor.   Cardiovascular: Negative for chest pain, palpitations and leg swelling.  Gastrointestinal: Negative for nausea, vomiting, abdominal pain, diarrhea, constipation, blood in stool and abdominal distention.  Genitourinary: Negative for urgency, hematuria, flank pain, discharge, difficulty urinating and genital sores.  Musculoskeletal: Negative for myalgias, back pain, joint swelling, arthralgias, gait problem and neck stiffness.  Skin: Negative for rash.  Neurological: Negative  for dizziness, syncope, speech difficulty, weakness, numbness and headaches.  Hematological: Negative for adenopathy. Does not bruise/bleed easily.  Psychiatric/Behavioral: Negative for behavioral problems and dysphoric mood. The patient is not nervous/anxious.        Objective:   Physical Exam  Constitutional: He is oriented to person, place, and time. He appears well-developed.  Blood pressure 120/70  HENT:  Head: Normocephalic.  Right Ear: External ear normal.  Left Ear: External ear normal.  Eyes: Conjunctivae and EOM are normal.  Neck: Normal range of motion.  Cardiovascular: Normal rate and normal heart sounds.   Pulmonary/Chest: Breath sounds normal.  Abdominal: Bowel sounds are normal.  Musculoskeletal: Normal range of motion. He exhibits no edema or tenderness.  Neurological: He is alert and oriented to person, place, and time.  Psychiatric: He has a normal mood and affect. His behavior is normal.          Assessment & Plan:   Diabetes.  Much better controlled.  Will check a hemoglobin A1c Hypertension.  Nice control.  Will continue present regimen  No change in therapy  Recheck 3 months

## 2014-12-29 NOTE — Progress Notes (Signed)
Pre visit review using our clinic review tool, if applicable. No additional management support is needed unless otherwise documented below in the visit note. 

## 2014-12-30 ENCOUNTER — Encounter: Payer: Self-pay | Admitting: Internal Medicine

## 2014-12-30 NOTE — Telephone Encounter (Signed)
Pt asking for lab results from 7/16, please advise.

## 2015-01-16 ENCOUNTER — Other Ambulatory Visit: Payer: Self-pay | Admitting: Internal Medicine

## 2015-01-21 ENCOUNTER — Encounter: Payer: Self-pay | Admitting: Internal Medicine

## 2015-01-21 ENCOUNTER — Ambulatory Visit (INDEPENDENT_AMBULATORY_CARE_PROVIDER_SITE_OTHER): Payer: 59 | Admitting: Internal Medicine

## 2015-01-21 VITALS — BP 124/88 | HR 61 | Temp 98.2°F | Wt 246.5 lb

## 2015-01-21 DIAGNOSIS — M545 Low back pain, unspecified: Secondary | ICD-10-CM | POA: Insufficient documentation

## 2015-01-21 DIAGNOSIS — M5441 Lumbago with sciatica, right side: Secondary | ICD-10-CM

## 2015-01-21 MED ORDER — KETOROLAC TROMETHAMINE 30 MG/ML IJ SOLN
60.0000 mg | Freq: Once | INTRAMUSCULAR | Status: AC
  Administered 2015-01-21: 60 mg via INTRAMUSCULAR

## 2015-01-21 MED ORDER — METHYLPREDNISOLONE ACETATE 80 MG/ML IJ SUSP
80.0000 mg | Freq: Once | INTRAMUSCULAR | Status: AC
  Administered 2015-01-21: 80 mg via INTRAMUSCULAR

## 2015-01-21 NOTE — Assessment & Plan Note (Signed)
This seems more muscular I suspect the leg weakness is more chronic Discussed core strengthening Toradol 60/ depomedrol 80 IM today

## 2015-01-21 NOTE — Progress Notes (Signed)
Pre visit review using our clinic review tool, if applicable. No additional management support is needed unless otherwise documented below in the visit note. 

## 2015-01-21 NOTE — Progress Notes (Signed)
   Subjective:    Patient ID: Gaston Islam., male    DOB: Nov 10, 1975, 39 y.o.   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 amLODipine (NORVASC) 5 MG tablet Take 1 tablet (5 mg total) by mouth daily. 90 tablet 3  . Blood Glucose Calibration (ACCU-CHEK INSTANT CONTROL) LIQD 1 each by In Vitro route 2 (two) times a week. 1 each 0  . canagliflozin (INVOKANA) 300 MG TABS tablet Take 300 mg by mouth daily before breakfast. 30 tablet 4  . DEXILANT 60 MG capsule TAKE 1 CAPSULE BY MOUTH DAILY. 90 capsule 2  . glipiZIDE (GLUCOTROL XL) 5 MG 24 hr tablet TAKE 1 TABLET BY MOUTH ONCE DAILY 90 tablet 1  . glucose blood (FREESTYLE LITE) test strip 1 each by Other route 3 (three) times daily. Use as instructed 300 each 3  . losartan-hydrochlorothiazide (HYZAAR) 100-25 MG per tablet TAKE 1 TABLET BY MOUTH DAILY. 90 tablet 2  . metFORMIN (GLUCOPHAGE) 1000 MG tablet TAKE 1 TABLET BY MOUTH  DAILY WITH A MEAL 180 tablet 0  . pioglitazone (ACTOS) 30 MG tablet Take 1 tablet (30 mg total) by mouth daily. 90 tablet 2  . pravastatin (PRAVACHOL) 40 MG tablet TAKE 1 TABLET BY MOUTH ONCE DAILY 90 tablet 0   No current facility-administered medications on file prior to visit.    Allergies  Allergen Reactions  . Demerol Itching    Past Medical History  Diagnosis Date  . Diabetes mellitus   . Hyperlipidemia   . Hypertension   . Sinusitis     Past Surgical History  Procedure Laterality Date  . Epidural injections   2002      No family history on file.  History   Social History  . Marital Status: Married    Spouse Name: N/A  . Number of Children: 3  . 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 Smoking status: Never Smoker   . Smokeless tobacco: Never Used  . Alcohol Use: Yes     Comment: occ.   . Drug Use: No  . Sexual Activity: Not on file   Other Topics Concern  . Not on file   Social History Narrative   Review of Systems No loss of bowel or bladder control No dysuria    Objective:   Physical Exam  Musculoskeletal:  SLR is negative No spine tenderness Mild paraspinal tenderness ~L1 on right  Neurological:  Normal gait Subtle right leg weakness--- knee and ankle extensors and flexors          Assessment & Plan:

## 2015-03-16 ENCOUNTER — Ambulatory Visit (INDEPENDENT_AMBULATORY_CARE_PROVIDER_SITE_OTHER): Payer: 59 | Admitting: Internal Medicine

## 2015-03-16 ENCOUNTER — Encounter: Payer: Self-pay | Admitting: Internal Medicine

## 2015-03-16 VITALS — BP 120/72 | HR 84 | Temp 98.2°F | Resp 20 | Ht 72.0 in | Wt 243.0 lb

## 2015-03-16 DIAGNOSIS — I10 Essential (primary) hypertension: Secondary | ICD-10-CM

## 2015-03-16 DIAGNOSIS — E119 Type 2 diabetes mellitus without complications: Secondary | ICD-10-CM | POA: Diagnosis not present

## 2015-03-16 DIAGNOSIS — M545 Low back pain, unspecified: Secondary | ICD-10-CM

## 2015-03-16 MED ORDER — TRAMADOL HCL 50 MG PO TABS: 50.0000 mg | ORAL_TABLET | Freq: Four times a day (QID) | ORAL | Status: DC | PRN

## 2015-03-16 NOTE — Progress Notes (Signed)
Subjective:    Patient ID: Reginald Kirby., male    DOB: 25-Dec-1975, 39 y.o.   MRN: [id number]  HPI  39 year old patient who has a history of essential hypertension and type 2 diabetes who presents with a chief complaint of worsening back pain since July. He has a long history low back pain and has had lumbar MRIs in 2005 2008 and 2011.  He benefited greatly from epidurals in 2005.  He is also benefited from physical therapy.  He has had worsening pain in the lumbar area, the right greater than the left.  Pain radiates to both eyes.  Both anterior and posterior.  Pain is worse in the morning when he first gets out of bed.  He has nocturnal pain.  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  No bowel or bladder symptoms  Lab Results  Component Value Date   HGBA1C 6.6* 12/29/2014    Past Medical History  Diagnosis Date  . Diabetes mellitus   . Hyperlipidemia   . Hypertension   . Sinusitis     Social History   Social History  . Marital Status: Married    Spouse Name: N/A  . Number of Children: 3  . 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 Smoking status: Never Smoker   . Smokeless tobacco: Never Used  . Alcohol Use: Yes     Comment: occ.   . Drug Use: No  . Sexual Activity: Not on file   Other Topics Concern  . Not on file   Social History Narrative    Past Surgical History  Procedure Laterality Date  . Epidural injections   2002    No family history on file.  Allergies  Allergen Reactions  . Demerol Itching    Current Outpatient Prescriptions on File Prior to Visit  Medication Sig Dispense Refill  . amLODipine (NORVASC) 5 MG tablet Take 1 tablet (5 mg total) by mouth daily. 90 tablet 3  . Blood Glucose Calibration (ACCU-CHEK INSTANT CONTROL) LIQD 1 each by In Vitro route 2 (two) times a week. 1 each 0  . canagliflozin (INVOKANA) 300 MG TABS  tablet Take 300 mg by mouth daily before breakfast. 30 tablet 4  . DEXILANT 60 MG capsule TAKE 1 CAPSULE BY MOUTH DAILY. 90 capsule 2  . glipiZIDE (GLUCOTROL XL) 5 MG 24 hr tablet TAKE 1 TABLET BY MOUTH ONCE DAILY 90 tablet 1  . glucose blood (FREESTYLE LITE) test strip 1 each by Other route 3 (three) times daily. Use as instructed 300 each 3  . losartan-hydrochlorothiazide (HYZAAR) 100-25 MG per tablet TAKE 1 TABLET BY MOUTH DAILY. 90 tablet 2  . metFORMIN (GLUCOPHAGE) 1000 MG tablet TAKE 1 TABLET BY MOUTH  DAILY WITH A MEAL 180 tablet 0  . pioglitazone (ACTOS) 30 MG tablet Take 1 tablet (30 mg total) by mouth daily. 90 tablet 2  . pravastatin (PRAVACHOL) 40 MG tablet TAKE 1 TABLET BY MOUTH ONCE DAILY 90 tablet 0   No current facility-administered medications on file prior to visit.    BP 120/72 mmHg  Pulse 84  Temp(Src) 98.2 F (36.8 C) (Oral)  Resp 20  Ht 6' (1.829 m)  Wt 243 lb (110.224 kg)  BMI 32.95 kg/m2  SpO2 98%     Review of Systems  Constitutional: Negative for fever, chills, appetite  change and fatigue.  HENT: Negative for congestion, dental problem, ear pain, hearing loss, sore throat, tinnitus, trouble swallowing and voice change.   Eyes: Negative for pain, discharge and visual disturbance.  Respiratory: Negative for cough, chest tightness, wheezing and stridor.   Cardiovascular: Negative for chest pain, palpitations and leg swelling.  Gastrointestinal: Negative for nausea, vomiting, abdominal pain, diarrhea, constipation, blood in stool and abdominal distention.  Genitourinary: Negative for urgency, hematuria, flank pain, discharge, difficulty urinating and genital sores.  Musculoskeletal: Positive for back pain. Negative for myalgias, joint swelling, arthralgias, gait problem and neck stiffness.  Skin: Negative for rash.  Neurological: Negative for dizziness, syncope, speech difficulty, weakness, numbness and headaches.  Hematological: Negative for adenopathy. Does  not bruise/bleed easily.  Psychiatric/Behavioral: Negative for behavioral problems and dysphoric mood. The patient is not nervous/anxious.        Objective:   Physical Exam  Constitutional: He appears well-developed and well-nourished. No distress.  Musculoskeletal:  Negative straight leg test No tenderness to lumbar area No motor weakness Able to walk on toes and heels without difficulty          Assessment & Plan:  Chronic lumbar pain with recent exacerbation over the past 2-3 months.  Pain has worsened to the point where he has considerable nocturnal symptoms and has a difficult time working.  We'll proceed with a lumbar MRI Diabetes, stable.  Hypertension, well-controlled  Return in 2 months for follow-up

## 2015-03-16 NOTE — Patient Instructions (Signed)
Lumbar MRI as discussed  Report any new or worsening symptoms  Return as scheduled for follow-up of diabetes

## 2015-03-16 NOTE — Progress Notes (Signed)
Pre visit review using our clinic review tool, if applicable. No additional management support is needed unless otherwise documented below in the visit note. 

## 2015-03-17 DIAGNOSIS — Z0279 Encounter for issue of other medical certificate: Secondary | ICD-10-CM

## 2015-03-21 ENCOUNTER — Telehealth: Payer: Self-pay | Admitting: Internal Medicine

## 2015-03-21 NOTE — Telephone Encounter (Signed)
error 

## 2015-03-27 ENCOUNTER — Telehealth: Payer: Self-pay | Admitting: Internal Medicine

## 2015-03-27 NOTE — Telephone Encounter (Signed)
Pt call to ask if Dr Kirtland Bouchard received his FMLA paperwork

## 2015-03-27 NOTE — Telephone Encounter (Signed)
Cheryl, I have not seen them but Dr.K may have already done it. His basket is empty and I was not here on Friday.

## 2015-04-01 ENCOUNTER — Ambulatory Visit
Admission: RE | Admit: 2015-04-01 | Discharge: 2015-04-01 | Disposition: A | Discharge status: Home / Self Care | Payer: 59 | Source: Ambulatory Visit | Attending: Internal Medicine | Admitting: Internal Medicine

## 2015-04-01 DIAGNOSIS — M545 Low back pain, unspecified: Secondary | ICD-10-CM

## 2015-04-03 ENCOUNTER — Ambulatory Visit (INDEPENDENT_AMBULATORY_CARE_PROVIDER_SITE_OTHER): Payer: 59 | Admitting: Internal Medicine

## 2015-04-03 ENCOUNTER — Encounter: Payer: Self-pay | Admitting: Internal Medicine

## 2015-04-03 VITALS — BP 120/80 | HR 87 | Temp 98.5°F | Resp 20 | Ht 72.0 in | Wt 246.0 lb

## 2015-04-03 DIAGNOSIS — E119 Type 2 diabetes mellitus without complications: Secondary | ICD-10-CM | POA: Insufficient documentation

## 2015-04-03 DIAGNOSIS — M545 Low back pain: Secondary | ICD-10-CM

## 2015-04-03 LAB — HEMOGLOBIN A1C: Hgb A1c MFr Bld: 6.4 % (ref 4.6–6.5)

## 2015-04-03 MED ORDER — HYDROCODONE-ACETAMINOPHEN 5-325 MG PO TABS: 1.0000 | ORAL_TABLET | Freq: Four times a day (QID) | ORAL | Status: DC | PRN

## 2015-04-03 NOTE — Patient Instructions (Signed)
Follow-up with neurosurgery  Limit your sodium (Salt) intake   Please check your hemoglobin A1c every 3 months

## 2015-04-03 NOTE — Progress Notes (Signed)
Subjective:    Patient ID: Reginald IslamErnest H Jett Jr., male    DOB: 1976-03-23, 39 y.o.   MRN: [id number]  HPI 39 year old patient who is seen today in follow-up.  He has a history of diabetes which has been controlled on oral medication.  Blood sugars occasionally in the sixties.  Lab Results  Component Value Date   HGBA1C 6.6* 12/29/2014   His chief complaint is persistent low back pain.  He has had a recent MRI.  He has been quite uncomfortable for about 4 weeks and has been on FMLA disability for 2 weeks.  Pain is worse in the morning and aggravated by prolonged standing.  Past Medical History  Diagnosis Date  . Diabetes mellitus   . Hyperlipidemia   . Hypertension   . Sinusitis     Social History   Social History  . Marital Status: Married    Spouse Name: N/A  . Number of Children: 3  . Years of Education: N/A   Occupational History  . RN-- [employer]    Social History Main Topics  . Smoking status: Never Smoker   . Smokeless tobacco: Never Used  . Alcohol Use: Yes     Comment: occ.   . Drug Use: No  . Sexual Activity: Not on file   Other Topics Concern  . Not on file   Social History Narrative    Past Surgical History  Procedure Laterality Date  . Epidural injections   2002    No family history on file.  Allergies  Allergen Reactions  . Demerol Itching    Current Outpatient Prescriptions on File Prior to Visit  Medication Sig Dispense Refill  . amLODipine (NORVASC) 5 MG tablet Take 1 tablet (5 mg total) by mouth daily. 90 tablet 3  . Blood Glucose Calibration (ACCU-CHEK INSTANT CONTROL) LIQD 1 each by In Vitro route 2 (two) times a week. 1 each 0  . canagliflozin (INVOKANA) 300 MG TABS tablet Take 300 mg by mouth daily before breakfast. 30 tablet 4  . DEXILANT 60 MG capsule TAKE 1 CAPSULE BY MOUTH DAILY. 90 capsule 2  . glipiZIDE (GLUCOTROL XL) 5 MG 24 hr tablet TAKE 1 TABLET BY MOUTH ONCE DAILY 90 tablet 1  . glucose blood (FREESTYLE LITE) test  strip 1 each by Other route 3 (three) times daily. Use as instructed 300 each 3  . losartan-hydrochlorothiazide (HYZAAR) 100-25 MG per tablet TAKE 1 TABLET BY MOUTH DAILY. 90 tablet 2  . metFORMIN (GLUCOPHAGE) 1000 MG tablet TAKE 1 TABLET BY MOUTH  DAILY WITH A MEAL 180 tablet 0  . pioglitazone (ACTOS) 30 MG tablet Take 1 tablet (30 mg total) by mouth daily. 90 tablet 2  . pravastatin (PRAVACHOL) 40 MG tablet TAKE 1 TABLET BY MOUTH ONCE DAILY 90 tablet 0  . traMADol (ULTRAM) 50 MG tablet Take 1 tablet (50 mg total) by mouth every 6 (six) hours as needed. 90 tablet 0   No current facility-administered medications on file prior to visit.    BP 120/80 mmHg  Pulse 87  Temp(Src) 98.5 F (36.9 C) (Oral)  Resp 20  Ht 6' (1.829 m)  Wt 246 lb (111.585 kg)  BMI 33.36 kg/m2  SpO2 98%      Review of Systems  Constitutional: Negative for fever, chills, appetite change and fatigue.  HENT: Negative for congestion, dental problem, ear pain, hearing loss, sore throat, tinnitus, trouble swallowing and voice change.   Eyes: Negative for pain, discharge and visual  disturbance.  Respiratory: Negative for cough, chest tightness, wheezing and stridor.   Cardiovascular: Negative for chest pain, palpitations and leg swelling.  Gastrointestinal: Negative for nausea, vomiting, abdominal pain, diarrhea, constipation, blood in stool and abdominal distention.  Genitourinary: Negative for urgency, hematuria, flank pain, discharge, difficulty urinating and genital sores.  Musculoskeletal: Positive for back pain. Negative for myalgias, joint swelling, arthralgias, gait problem and neck stiffness.  Skin: Negative for rash.  Neurological: Negative for dizziness, syncope, speech difficulty, weakness, numbness and headaches.  Hematological: Negative for adenopathy. Does not bruise/bleed easily.  Psychiatric/Behavioral: Negative for behavioral problems and dysphoric mood. The patient is not nervous/anxious.          Objective:   Physical Exam  Constitutional: He is oriented to person, place, and time. He appears well-developed.  Abdominal: Bowel sounds are normal.  Musculoskeletal: He exhibits no edema or tenderness.  Neurological: He is alert and oriented to person, place, and time.  Psychiatric: He has a normal mood and affect. His behavior is normal.          Assessment & Plan:  Diabetes mellitus.  Appears to be a well controlled.  Will check a hemoglobin A1c.  We'll continue present regimen except we'll put glipizide on hold  Acute on chronic low back pain.  Will set up follow-up with his neurosurgeon  Return here in 3 months

## 2015-04-03 NOTE — Progress Notes (Signed)
Pre visit review using our clinic review tool, if applicable. No additional management support is needed unless otherwise documented below in the visit note. 

## 2015-04-05 ENCOUNTER — Encounter: Payer: Self-pay | Admitting: Internal Medicine

## 2015-04-06 ENCOUNTER — Telehealth: Payer: Self-pay | Admitting: Internal Medicine

## 2015-04-06 NOTE — Telephone Encounter (Signed)
Pt would like blood work results °

## 2015-04-06 NOTE — Telephone Encounter (Signed)
Left message on voicemail to call office.  

## 2015-04-06 NOTE — Telephone Encounter (Signed)
Pt called back, told him Hemoglobin A1c was 6.4 great. Pt verbalized understanding.

## 2015-04-07 ENCOUNTER — Other Ambulatory Visit: Payer: Self-pay | Admitting: Internal Medicine

## 2015-06-05 ENCOUNTER — Other Ambulatory Visit: Payer: Self-pay | Admitting: Internal Medicine

## 2015-07-03 ENCOUNTER — Other Ambulatory Visit: Payer: Self-pay | Admitting: Internal Medicine

## 2015-07-06 ENCOUNTER — Encounter: Payer: Self-pay | Admitting: Internal Medicine

## 2015-07-06 ENCOUNTER — Ambulatory Visit (INDEPENDENT_AMBULATORY_CARE_PROVIDER_SITE_OTHER): Payer: 59 | Admitting: Internal Medicine

## 2015-07-06 VITALS — BP 140/88 | HR 81 | Temp 98.8°F | Resp 20 | Ht 71.0 in | Wt 249.0 lb

## 2015-07-06 DIAGNOSIS — E119 Type 2 diabetes mellitus without complications: Secondary | ICD-10-CM

## 2015-07-06 DIAGNOSIS — Z23 Encounter for immunization: Secondary | ICD-10-CM | POA: Diagnosis not present

## 2015-07-06 DIAGNOSIS — Z0000 Encounter for general adult medical examination without abnormal findings: Secondary | ICD-10-CM | POA: Diagnosis not present

## 2015-07-06 DIAGNOSIS — Z800 Family history of malignant neoplasm of digestive organs: Secondary | ICD-10-CM

## 2015-07-06 DIAGNOSIS — I10 Essential (primary) hypertension: Secondary | ICD-10-CM

## 2015-07-06 DIAGNOSIS — M4806 Spinal stenosis, lumbar region: Secondary | ICD-10-CM | POA: Diagnosis not present

## 2015-07-06 DIAGNOSIS — E785 Hyperlipidemia, unspecified: Secondary | ICD-10-CM | POA: Diagnosis not present

## 2015-07-06 LAB — LIPID PANEL
CHOLESTEROL: 204 mg/dL — AB (ref 0–200)
HDL: 51 mg/dL (ref >39.00)
LDL CALC: 127 mg/dL — AB (ref 0–99)
NonHDL: 152.85
TRIGLYCERIDES: 129 mg/dL (ref 0.0–149.0)
Total CHOL/HDL Ratio: 4
VLDL: 25.8 mg/dL (ref 0.0–40.0)

## 2015-07-06 LAB — MICROALBUMIN / CREATININE URINE RATIO
Creatinine,U: 138.5 mg/dL
MICROALB UR: 2.4 mg/dL — AB (ref 0.0–1.9)
Microalb Creat Ratio: 1.7 mg/g (ref 0.0–30.0)

## 2015-07-06 NOTE — Patient Instructions (Signed)
Please check your hemoglobin A1c every 3-6 months  Limit your sodium (Salt) intake    It is important that you exercise regularly, at least 20 minutes 3 to 4 times per week.  If you develop chest pain or shortness of breath seek  medical attention.  Schedule your colonoscopy to help detect colon cancer at age 40  Please see your eye doctor yearly to check for diabetic eye damage

## 2015-07-06 NOTE — Progress Notes (Signed)
Subjective:    Patient ID: Reginald Kirby., male    DOB: 1975-11-28, 40 y.o.   MRN: [id number]  HPI  40 -year-old patient who is seen today for an annual exam.  He has a history of hypertension since 2002 and diabetes mellitus and dyslipidemia since 2007. He is doing quite well today without concerns or complaints.  He does have a history of lumbar disc disease and has required epidurals in the past this was in 2002 has seen Dr. Danielle Dess in the past.  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 Diabetes mellitus   . Hyperlipidemia   . Hypertension   . Sinusitis     Social History   Social History  . Marital Status: Married    Spouse Name: N/A  . Number of Children: 3  . 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 Smoking status: Never Smoker   . Smokeless tobacco: Never Used  . Alcohol Use: Yes     Comment: occ.   . Drug Use: No  . Sexual Activity: Not on file   Other Topics Concern  . Not on file   Social History Narrative    Past Surgical History  Procedure Laterality Date  . Epidural injections   2002    No family history on file.  Allergies  Allergen Reactions  . Demerol Itching    Current Outpatient Prescriptions on File Prior to Visit  Medication Sig Dispense Refill  . amLODipine (NORVASC) 5 MG tablet Take  1 tablet (5 mg total) by mouth daily. 90 tablet 3  . Blood Glucose Calibration (ACCU-CHEK INSTANT CONTROL) LIQD 1 each by In Vitro route 2 (two) times a week. 1 each 0  . DEXILANT 60 MG capsule TAKE 1 CAPSULE BY MOUTH DAILY. 90 capsule 2  . glipiZIDE (GLUCOTROL XL) 5 MG 24 hr tablet TAKE 1 TABLET BY MOUTH ONCE DAILY 90 tablet 1  . glucose blood (FREESTYLE LITE) test strip 1 each by Other route 3 (three) times daily. Use as instructed 300 each 3  . HYDROcodone-acetaminophen (NORCO/VICODIN) 5-325 MG tablet Take 1 tablet by mouth every 6 (six) hours as needed for moderate pain. 30 tablet 0  . INVOKANA 300 MG TABS tablet TAKE 1 TABLET BY MOUTH DAILY BEFORE BREAKFAST. 30 tablet 5  . losartan-hydrochlorothiazide (HYZAAR) 100-25 MG per tablet TAKE 1 TABLET BY MOUTH DAILY. 90 tablet 2  . metFORMIN (GLUCOPHAGE) 1000 MG tablet TAKE 1 TABLET BY MOUTH DAILY WITH A MEAL 180 tablet 3  . pioglitazone (ACTOS) 30 MG tablet TAKE 1 TABLET BY MOUTH DAILY. 90 tablet 1  . pravastatin (PRAVACHOL) 40 MG tablet TAKE 1 TABLET BY MOUTH ONCE DAILY 90 tablet 0  . traMADol (ULTRAM) 50 MG tablet Take 1 tablet (50 mg total) by mouth every 6 (six) hours  as needed. 90 tablet 0   No current facility-administered medications on file prior to visit.    BP 140/88 mmHg  Pulse 81  Temp(Src) 98.8 F (37.1 C) (Oral)  Resp 20  Ht  (1.803 m)  Wt 249 lb (112.946 kg)  BMI 34.74 kg/m2  SpO2 98%     Review of Systems  Constitutional: Negative for fever, chills, activity change, appetite change and fatigue.  HENT: Negative for congestion, dental problem, ear pain, hearing loss, mouth sores, rhinorrhea, sinus pressure, sneezing, tinnitus, trouble swallowing and voice change.   Eyes: Negative for photophobia, pain, redness and visual disturbance.  Respiratory: Negative for apnea, cough, choking, chest tightness, shortness of breath and wheezing.   Cardiovascular: Negative for chest pain, palpitations and leg swelling.    Gastrointestinal: Negative for nausea, vomiting, abdominal pain, diarrhea, constipation, blood in stool, abdominal distention, anal bleeding and rectal pain.  Genitourinary: Negative for dysuria, urgency, frequency, hematuria, flank pain, decreased urine volume, discharge, penile swelling, scrotal swelling, difficulty urinating, genital sores and testicular pain.  Musculoskeletal: Positive for back pain. Negative for myalgias, joint swelling, arthralgias, gait problem, neck pain and neck stiffness.  Skin: Negative for color change, rash and wound.  Neurological: Negative for dizziness, tremors, seizures, syncope, facial asymmetry, speech difficulty, weakness, light-headedness, numbness and headaches.  Hematological: Negative for adenopathy. Does not bruise/bleed easily.  Psychiatric/Behavioral: Negative for suicidal ideas, hallucinations, behavioral problems, confusion, sleep disturbance, self-injury, dysphoric mood, decreased concentration and agitation. The patient is not nervous/anxious.        Objective:   Physical Exam  Constitutional: He appears well-developed and well-nourished.  HENT:  Head: Normocephalic and atraumatic.  Right Ear: External ear normal.  Left Ear: External ear normal.  Nose: Nose normal.  Mouth/Throat: Oropharynx is clear and moist.  Eyes: Conjunctivae and EOM are normal. Pupils are equal, round, and reactive to light. No scleral icterus.  Neck: Normal range of motion. Neck supple. No JVD present. No thyromegaly present.  Cardiovascular: Regular rhythm, normal heart sounds and intact distal pulses.  Exam reveals no gallop and no friction rub.   No murmur heard. Pulmonary/Chest: Effort normal and breath sounds normal. He exhibits no tenderness.  Abdominal: Soft. Bowel sounds are normal. He exhibits no distension and no mass. There is no tenderness.  Genitourinary: Penis normal.  Musculoskeletal: Normal range of motion. He exhibits no edema or tenderness.   Lymphadenopathy:    He has no cervical adenopathy.  Neurological: He is alert. He has normal reflexes. No cranial nerve deficit. Coordination normal.  Skin: Skin is warm and dry. No rash noted.  Psychiatric: He has a normal mood and affect. His behavior is normal.          Assessment & Plan:   Preventive health examination Chronic low back pain/spinal stenosis/degenerative joint disease Diabetes mellitus.  Well-controlled Hypertension Dyslipidemia.  Continue statin therapy  Continue home blood pressure monitoring Recheck 4 months Medications updated  Colonoscopy age 55

## 2015-07-06 NOTE — Progress Notes (Signed)
Pre visit review using our clinic review tool, if applicable. No additional management support is needed unless otherwise documented below in the visit note. 

## 2015-07-10 ENCOUNTER — Telehealth: Payer: Self-pay | Admitting: Internal Medicine

## 2015-07-10 NOTE — Telephone Encounter (Signed)
Left detailed message on voicemail results were released to "My Chart" my take up to 24-48 hours to show up. Any questions please call office.

## 2015-07-10 NOTE — Telephone Encounter (Signed)
Patient would like his lab results to be release to mychart.

## 2015-08-04 ENCOUNTER — Ambulatory Visit (INDEPENDENT_AMBULATORY_CARE_PROVIDER_SITE_OTHER): Payer: 59 | Admitting: Internal Medicine

## 2015-08-04 ENCOUNTER — Encounter: Payer: Self-pay | Admitting: Internal Medicine

## 2015-08-04 VITALS — BP 138/90 | HR 81 | Temp 98.5°F | Resp 20 | Ht 71.0 in | Wt 253.0 lb

## 2015-08-04 DIAGNOSIS — J069 Acute upper respiratory infection, unspecified: Secondary | ICD-10-CM | POA: Diagnosis not present

## 2015-08-04 DIAGNOSIS — I10 Essential (primary) hypertension: Secondary | ICD-10-CM

## 2015-08-04 DIAGNOSIS — E119 Type 2 diabetes mellitus without complications: Secondary | ICD-10-CM | POA: Diagnosis not present

## 2015-08-04 MED ORDER — GABAPENTIN 300 MG PO CAPS: 300.0000 mg | ORAL_CAPSULE | Freq: Three times a day (TID) | ORAL | Status: DC

## 2015-08-04 NOTE — Progress Notes (Signed)
Subjective:    Patient ID: Reginald Kirby., male    DOB: Nov 15, 1975, 40 y.o.   MRN: [id number]  HPI  40 year old patient who has type 2 diabetes, hypertension and dyslipidemia.  He also has a history of spinal stenosis and is awaiting approval for surgery. For the past week he has had sore throat and some right ear discomfort.  He has had some stiffness in the neck and shoulder area.  No fever.  No purulent sinus drainage.  Medical regimen includes tramadol for his low back pain.  Past Medical History  Diagnosis Date  . Diabetes mellitus   . Hyperlipidemia   . Hypertension   . Sinusitis     Social History   Social History  . Marital Status: Married    Spouse Name: N/A  . Number of Children: 3  . 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 Smoking status: Never Smoker   . Smokeless tobacco: Never Used  . Alcohol Use: Yes     Comment: occ.   . Drug Use: No  . Sexual Activity: Not on file   Other Topics Concern  . Not on file   Social History Narrative    Past Surgical History  Procedure Laterality Date  . Epidural injections   2002    No family history on file.  Allergies  Allergen Reactions  . Demerol Itching    Current Outpatient Prescriptions on File Prior to Visit  Medication Sig Dispense Refill  . amLODipine (NORVASC) 5 MG tablet Take 1 tablet (5 mg total) by mouth daily. 90 tablet 3  . Blood Glucose Calibration (ACCU-CHEK INSTANT CONTROL) LIQD 1 each by In Vitro route 2 (two) times a week. 1 each 0  . DEXILANT 60 MG capsule TAKE 1 CAPSULE BY MOUTH DAILY. 90 capsule 2  . glipiZIDE (GLUCOTROL XL) 5 MG 24 hr tablet TAKE 1 TABLET BY MOUTH ONCE DAILY 90 tablet 1  . glucose blood (FREESTYLE LITE) test strip 1 each by Other route 3 (three) times daily. Use as instructed 300 each 3  . HYDROcodone-acetaminophen (NORCO/VICODIN) 5-325 MG tablet Take 1 tablet by mouth every 6 (six) hours as needed for  moderate pain. 30 tablet 0  . INVOKANA 300 MG TABS tablet TAKE 1 TABLET BY MOUTH DAILY BEFORE BREAKFAST. 30 tablet 5  . losartan-hydrochlorothiazide (HYZAAR) 100-25 MG per tablet TAKE 1 TABLET BY MOUTH DAILY. 90 tablet 2  . metFORMIN (GLUCOPHAGE) 1000 MG tablet TAKE 1 TABLET BY MOUTH DAILY WITH A MEAL 180 tablet 3  . pioglitazone (ACTOS) 30 MG tablet TAKE 1 TABLET BY MOUTH DAILY. 90 tablet 1  . pravastatin (PRAVACHOL) 40 MG tablet TAKE 1 TABLET BY MOUTH ONCE DAILY 90 tablet 0  . traMADol (ULTRAM) 50 MG tablet Take 1 tablet (50 mg total) by mouth every 6 (six) hours as needed. 90 tablet 0   No current facility-administered medications on file prior to visit.    BP 138/90 mmHg  Pulse 81  Temp(Src) 98.5 F (36.9 C) (Oral)  Resp 20  Ht  (1.803 m)  Wt 253 lb (114.76 kg)  BMI 35.30 kg/m2  SpO2 98%     Review of Systems  Constitutional: Positive for activity change, appetite change and fatigue. Negative for fever and chills.  HENT: Positive for congestion, ear pain and sore throat. Negative for dental problem, hearing loss, tinnitus, trouble swallowing and voice change.  Eyes: Negative for pain, discharge and visual disturbance.  Respiratory: Negative for cough, chest tightness, wheezing and stridor.   Cardiovascular: Negative for chest pain, palpitations and leg swelling.  Gastrointestinal: Negative for nausea, vomiting, abdominal pain, diarrhea, constipation, blood in stool and abdominal distention.  Genitourinary: Negative for urgency, hematuria, flank pain, discharge, difficulty urinating and genital sores.  Musculoskeletal: Negative for myalgias, back pain, joint swelling, arthralgias, gait problem and neck stiffness.  Skin: Negative for rash.  Neurological: Negative for dizziness, syncope, speech difficulty, weakness, numbness and headaches.  Hematological: Negative for adenopathy. Does not bruise/bleed easily.  Psychiatric/Behavioral: Negative for behavioral problems and  dysphoric mood. The patient is not nervous/anxious.        Objective:   Physical Exam  Constitutional: He is oriented to person, place, and time. He appears well-developed.  HENT:  Head: Normocephalic.  Right Ear: External ear normal.  Left Ear: External ear normal.  Oral pharynx minimally injected Both tympanic membranes clear  Eyes: Conjunctivae and EOM are normal.  Neck: Normal range of motion.  Cardiovascular: Normal rate and normal heart sounds.   Pulmonary/Chest: Breath sounds normal.  Abdominal: Bowel sounds are normal.  Musculoskeletal: Normal range of motion. He exhibits no edema or tenderness.  Neurological: He is alert and oriented to person, place, and time.  Psychiatric: He has a normal mood and affect. His behavior is normal.          Assessment & Plan:   Viral URI.  Will treat symptomatically Diabetes Hypertension

## 2015-08-04 NOTE — Patient Instructions (Signed)
HOME CARE INSTRUCTIONS  Drink plenty of water. Water helps thin the mucus so your sinuses can drain more easily.  Use a humidifier.  Inhale steam 3-4 times a day (for example, sit in the bathroom with the shower running).  Apply a warm, moist washcloth to your face 3-4 times a day, or as directed by your health care provider.  Use saline nasal sprays to help moisten and clean your sinuses.  Mucinex D 1 twice daily

## 2015-08-04 NOTE — Progress Notes (Signed)
Pre visit review using our clinic review tool, if applicable. No additional management support is needed unless otherwise documented below in the visit note. 

## 2015-08-10 DIAGNOSIS — H5211 Myopia, right eye: Secondary | ICD-10-CM | POA: Diagnosis not present

## 2015-08-10 DIAGNOSIS — H52223 Regular astigmatism, bilateral: Secondary | ICD-10-CM | POA: Diagnosis not present

## 2015-08-10 LAB — HM DIABETES EYE EXAM

## 2015-08-15 ENCOUNTER — Other Ambulatory Visit: Payer: Self-pay | Admitting: Internal Medicine

## 2015-08-17 ENCOUNTER — Telehealth: Payer: Self-pay | Admitting: Internal Medicine

## 2015-08-17 MED ORDER — HYDROCODONE-ACETAMINOPHEN 5-325 MG PO TABS: 1.0000 | ORAL_TABLET | Freq: Four times a day (QID) | ORAL | Status: DC | PRN

## 2015-08-17 NOTE — Telephone Encounter (Signed)
Patient need a refill of Hydrocodone.

## 2015-08-18 NOTE — Telephone Encounter (Signed)
Rx was print yesterday 08/17/15 

## 2015-09-13 DIAGNOSIS — M4806 Spinal stenosis, lumbar region: Secondary | ICD-10-CM | POA: Diagnosis not present

## 2015-09-14 ENCOUNTER — Other Ambulatory Visit: Payer: Self-pay | Admitting: Neurological Surgery

## 2015-09-14 DIAGNOSIS — M48061 Spinal stenosis, lumbar region without neurogenic claudication: Secondary | ICD-10-CM

## 2015-09-15 ENCOUNTER — Other Ambulatory Visit: Payer: Self-pay | Admitting: Internal Medicine

## 2015-09-17 ENCOUNTER — Ambulatory Visit
Admission: RE | Admit: 2015-09-17 | Discharge: 2015-09-17 | Disposition: A | Discharge status: Home / Self Care | Payer: 59 | Source: Ambulatory Visit | Attending: Neurological Surgery | Admitting: Neurological Surgery

## 2015-09-17 ENCOUNTER — Other Ambulatory Visit: Payer: Self-pay | Admitting: Internal Medicine

## 2015-09-17 DIAGNOSIS — M48061 Spinal stenosis, lumbar region without neurogenic claudication: Secondary | ICD-10-CM

## 2015-09-17 DIAGNOSIS — M4806 Spinal stenosis, lumbar region: Secondary | ICD-10-CM | POA: Diagnosis not present

## 2015-09-18 MED ORDER — HYDROCODONE-ACETAMINOPHEN 5-325 MG PO TABS: 1.0000 | ORAL_TABLET | Freq: Four times a day (QID) | ORAL | Status: DC | PRN

## 2015-09-18 NOTE — Telephone Encounter (Signed)
Pt notified Rx ready for pickup. Rx printed and signed.  

## 2015-09-22 ENCOUNTER — Encounter: Payer: Self-pay | Admitting: Internal Medicine

## 2015-10-04 ENCOUNTER — Other Ambulatory Visit (HOSPITAL_COMMUNITY): Payer: Self-pay | Admitting: *Deleted

## 2015-10-04 NOTE — Pre-Procedure Instructions (Signed)
Gaston IslamErnest H Kratt Jr.  10/04/2015      [facility] [place] - [address] [place] [address] Phone: [phone] Fax: [phone]  [id number] [place]- [place], [place] - [address] [facility] [address] [address] [place] [address] Phone: [phone] Fax: [phone]    Your procedure is scheduled on Friday, October 13, 2015 at 2:00 PM.  [facility]" Admitting Office at 11:00 AM.  Call this number if you have problems the morning of surgery: [phone]  Any questions prior to day of surgery, please call [phone] between 8 & 4 PM.   Remember:  Do not eat food or drink liquids after midnight Thursday, 10/12/15.  Take these medicines the morning of surgery with A SIP OF WATER: Amlodipine (Norvasc), Dexilant, Gabapentin (Neurontin), Hydrocodone or Tramadol - if needed.    How to Manage Your Diabetes Before Surgery   Why is it important to control my blood sugar before and after surgery?   Improving blood sugar levels before and after surgery helps healing and can limit problems.  A way of improving blood sugar control is eating a healthy diet by:  - Eating less sugar and carbohydrates  - Increasing activity/exercise  - Talk with your doctor about reaching your blood sugar goals  High blood sugars (greater than 180 mg/dL) can raise your risk of infections and slow down your recovery so you will need to focus on controlling your diabetes during the weeks before surgery.  Make sure that the doctor who takes care of your diabetes knows about your planned surgery including the date and location.  How do I manage my blood sugars before surgery?   Check your blood sugar at least 4 times a day, 2 days before surgery to make sure that they are not too high or low.  Check your blood sugar the morning of your surgery when you wake up and every 2  hours until you get to the Short-Stay unit.  Treat a low blood sugar (less than 70 mg/dL) with 1/2 cup of clear juice (cranberry or apple), 4 glucose tablets, OR glucose gel.  Recheck blood sugar in 15 minutes after treatment (to make sure it is greater than 70 mg/dL).  If blood sugar is not greater than 70 mg/dL on re-check, call [phone] for further instructions.   Report your blood sugar to the Short-Stay nurse when you get to Short-Stay.  References:  [facility], 2007 "How to Manage your Diabetes Before and After Surgery".  What do I do about my diabetes medications?   Do not take oral diabetes medicines (pills) the morning of surgery.   Do not wear jewelry, make-up or nail polish.  Do not wear lotions, powders, or perfumes.  You may wear deodorant.  Do not shave 48 hours prior to surgery.  Men may shave face and neck.  Do not bring valuables to the hospital.  [facility] is not responsible for any belongings or valuables.  Contacts, dentures or bridgework may not be worn into surgery.  Leave your suitcase in the car.  After surgery it may be brought to your room.  For patients admitted to the hospital, discharge time will be determined by your treatment team.  Special instructions:  See "Preparing for Surgery" Instruction sheet.  Please read over the following fact sheets that you were given. Pain Booklet, Coughing and  Deep Breathing, MRSA Information and Surgical Site Infection Prevention

## 2015-10-05 ENCOUNTER — Encounter (HOSPITAL_COMMUNITY): Payer: Self-pay

## 2015-10-05 ENCOUNTER — Encounter (HOSPITAL_COMMUNITY)
Admission: RE | Admit: 2015-10-05 | Discharge: 2015-10-05 | Disposition: A | Discharge status: Home / Self Care | Payer: 59 | Source: Ambulatory Visit | Attending: Neurological Surgery | Admitting: Neurological Surgery

## 2015-10-05 DIAGNOSIS — R9431 Abnormal electrocardiogram [ECG] [EKG]: Secondary | ICD-10-CM | POA: Diagnosis not present

## 2015-10-05 DIAGNOSIS — I10 Essential (primary) hypertension: Secondary | ICD-10-CM | POA: Diagnosis not present

## 2015-10-05 DIAGNOSIS — Z01818 Encounter for other preprocedural examination: Secondary | ICD-10-CM | POA: Diagnosis not present

## 2015-10-05 DIAGNOSIS — M4806 Spinal stenosis, lumbar region: Secondary | ICD-10-CM | POA: Insufficient documentation

## 2015-10-05 DIAGNOSIS — Z01812 Encounter for preprocedural laboratory examination: Secondary | ICD-10-CM | POA: Diagnosis not present

## 2015-10-05 HISTORY — DX: Anxiety disorder, unspecified: F41.9

## 2015-10-05 HISTORY — DX: Headache, unspecified: R51.9

## 2015-10-05 HISTORY — DX: Depression, unspecified: F32.A

## 2015-10-05 HISTORY — DX: Unspecified osteoarthritis, unspecified site: M19.90

## 2015-10-05 HISTORY — DX: Headache: R51

## 2015-10-05 HISTORY — DX: Gastro-esophageal reflux disease without esophagitis: K21.9

## 2015-10-05 HISTORY — DX: Major depressive disorder, single episode, unspecified: F32.9

## 2015-10-05 LAB — BASIC METABOLIC PANEL
ANION GAP: 11 (ref 5–15)
BUN: 12 mg/dL (ref 6–20)
CHLORIDE: 106 mmol/L (ref 101–111)
CO2: 26 mmol/L (ref 22–32)
Calcium: 10.1 mg/dL (ref 8.9–10.3)
Creatinine, Ser: 0.96 mg/dL (ref 0.61–1.24)
GFR calc Af Amer: >60 mL/min (ref >60)
GLUCOSE: 140 mg/dL — AB (ref 65–99)
POTASSIUM: 4 mmol/L (ref 3.5–5.1)
Sodium: 143 mmol/L (ref 135–145)

## 2015-10-05 LAB — CBC
HEMATOCRIT: 45.6 % (ref 39.0–52.0)
HEMOGLOBIN: 14.1 g/dL (ref 13.0–17.0)
MCH: 23.6 pg — ABNORMAL LOW (ref 26.0–34.0)
MCHC: 30.9 g/dL (ref 30.0–36.0)
MCV: 76.4 fL — AB (ref 78.0–100.0)
Platelets: 306 10*3/uL (ref 150–400)
RBC: 5.97 MIL/uL — ABNORMAL HIGH (ref 4.22–5.81)
RDW: 13.3 % (ref 11.5–15.5)
WBC: 4.5 10*3/uL (ref 4.0–10.5)

## 2015-10-05 LAB — SURGICAL PCR SCREEN
MRSA, PCR: NEGATIVE
Staphylococcus aureus: NEGATIVE

## 2015-10-05 LAB — GLUCOSE, CAPILLARY: GLUCOSE-CAPILLARY: 127 mg/dL — AB (ref 65–99)

## 2015-10-06 LAB — HEMOGLOBIN A1C
Hgb A1c MFr Bld: 6.9 % — ABNORMAL HIGH (ref 4.8–5.6)
MEAN PLASMA GLUCOSE: 151 mg/dL

## 2015-10-12 MED ORDER — CEFAZOLIN SODIUM-DEXTROSE 2-4 GM/100ML-% IV SOLN
2.0000 g | INTRAVENOUS | Status: AC
  Administered 2015-10-13: 2 g via INTRAVENOUS
  Filled 2015-10-12: qty 100

## 2015-10-13 ENCOUNTER — Ambulatory Visit (HOSPITAL_COMMUNITY): Payer: 59 | Admitting: Anesthesiology

## 2015-10-13 ENCOUNTER — Observation Stay (HOSPITAL_COMMUNITY)
Admission: RE | Admit: 2015-10-13 | Discharge: 2015-10-13 | Disposition: A | Discharge status: Home / Self Care | Payer: 59 | Source: Ambulatory Visit | Attending: Neurological Surgery | Admitting: Neurological Surgery

## 2015-10-13 ENCOUNTER — Ambulatory Visit (HOSPITAL_COMMUNITY): Payer: 59

## 2015-10-13 ENCOUNTER — Encounter (HOSPITAL_COMMUNITY): Payer: Self-pay | Admitting: General Practice

## 2015-10-13 ENCOUNTER — Encounter (HOSPITAL_COMMUNITY)
Admission: RE | Disposition: A | Discharge status: Home / Self Care | Payer: Self-pay | Source: Ambulatory Visit | Attending: Neurological Surgery

## 2015-10-13 DIAGNOSIS — R531 Weakness: Secondary | ICD-10-CM | POA: Diagnosis not present

## 2015-10-13 DIAGNOSIS — M961 Postlaminectomy syndrome, not elsewhere classified: Secondary | ICD-10-CM | POA: Diagnosis not present

## 2015-10-13 DIAGNOSIS — M5116 Intervertebral disc disorders with radiculopathy, lumbar region: Secondary | ICD-10-CM | POA: Diagnosis not present

## 2015-10-13 DIAGNOSIS — Z7984 Long term (current) use of oral hypoglycemic drugs: Secondary | ICD-10-CM | POA: Diagnosis not present

## 2015-10-13 DIAGNOSIS — I10 Essential (primary) hypertension: Secondary | ICD-10-CM | POA: Diagnosis not present

## 2015-10-13 DIAGNOSIS — E785 Hyperlipidemia, unspecified: Secondary | ICD-10-CM | POA: Insufficient documentation

## 2015-10-13 DIAGNOSIS — E119 Type 2 diabetes mellitus without complications: Secondary | ICD-10-CM | POA: Diagnosis not present

## 2015-10-13 DIAGNOSIS — M5416 Radiculopathy, lumbar region: Secondary | ICD-10-CM | POA: Diagnosis present

## 2015-10-13 DIAGNOSIS — Z419 Encounter for procedure for purposes other than remedying health state, unspecified: Secondary | ICD-10-CM

## 2015-10-13 DIAGNOSIS — K219 Gastro-esophageal reflux disease without esophagitis: Secondary | ICD-10-CM | POA: Diagnosis not present

## 2015-10-13 DIAGNOSIS — M4806 Spinal stenosis, lumbar region: Secondary | ICD-10-CM | POA: Diagnosis not present

## 2015-10-13 DIAGNOSIS — M1990 Unspecified osteoarthritis, unspecified site: Secondary | ICD-10-CM | POA: Diagnosis not present

## 2015-10-13 DIAGNOSIS — M5117 Intervertebral disc disorders with radiculopathy, lumbosacral region: Secondary | ICD-10-CM | POA: Diagnosis not present

## 2015-10-13 DIAGNOSIS — M4807 Spinal stenosis, lumbosacral region: Secondary | ICD-10-CM | POA: Diagnosis not present

## 2015-10-13 HISTORY — PX: LUMBAR LAMINECTOMY/DECOMPRESSION MICRODISCECTOMY: SHX5026

## 2015-10-13 LAB — GLUCOSE, CAPILLARY
GLUCOSE-CAPILLARY: 156 mg/dL — AB (ref 65–99)
Glucose-Capillary: 133 mg/dL — ABNORMAL HIGH (ref 65–99)
Glucose-Capillary: 210 mg/dL — ABNORMAL HIGH (ref 65–99)

## 2015-10-13 SURGERY — LUMBAR LAMINECTOMY/DECOMPRESSION MICRODISCECTOMY 2 LEVELS
Anesthesia: General | Site: Back

## 2015-10-13 MED ORDER — DOCUSATE SODIUM 100 MG PO CAPS: 100.0000 mg | ORAL_CAPSULE | Freq: Two times a day (BID) | ORAL | Status: DC

## 2015-10-13 MED ORDER — HYDROMORPHONE HCL 1 MG/ML IJ SOLN
INTRAMUSCULAR | Status: AC
  Administered 2015-10-13: 0.5 mg via INTRAVENOUS
  Filled 2015-10-13: qty 1

## 2015-10-13 MED ORDER — BUPIVACAINE HCL (PF) 0.5 % IJ SOLN
INTRAMUSCULAR | Status: DC | PRN
  Administered 2015-10-13: 5 mL

## 2015-10-13 MED ORDER — LIDOCAINE HCL (CARDIAC) 20 MG/ML IV SOLN
INTRAVENOUS | Status: DC | PRN
  Administered 2015-10-13: 40 mg via INTRAVENOUS

## 2015-10-13 MED ORDER — MIDAZOLAM HCL 2 MG/2ML IJ SOLN
INTRAMUSCULAR | Status: AC
  Filled 2015-10-13: qty 2

## 2015-10-13 MED ORDER — ONDANSETRON HCL 4 MG/2ML IJ SOLN: 4.0000 mg | Freq: Once | INTRAMUSCULAR | Status: DC | PRN

## 2015-10-13 MED ORDER — FENTANYL CITRATE (PF) 100 MCG/2ML IJ SOLN
INTRAMUSCULAR | Status: DC | PRN
  Administered 2015-10-13 (×4): 50 ug via INTRAVENOUS
  Administered 2015-10-13: 200 ug via INTRAVENOUS

## 2015-10-13 MED ORDER — SENNA 8.6 MG PO TABS: 1.0000 | ORAL_TABLET | Freq: Two times a day (BID) | ORAL | Status: DC

## 2015-10-13 MED ORDER — LIDOCAINE-EPINEPHRINE 1 %-1:100000 IJ SOLN
INTRAMUSCULAR | Status: DC | PRN
  Administered 2015-10-13: 5 mL

## 2015-10-13 MED ORDER — THROMBIN 5000 UNITS EX SOLR
CUTANEOUS | Status: DC | PRN
  Administered 2015-10-13 (×2): 5000 [IU] via TOPICAL

## 2015-10-13 MED ORDER — OXYCODONE-ACETAMINOPHEN 5-325 MG PO TABS
1.0000 | ORAL_TABLET | ORAL | Status: DC | PRN
  Administered 2015-10-13: 2 via ORAL

## 2015-10-13 MED ORDER — ACETAMINOPHEN 650 MG RE SUPP: 650.0000 mg | RECTAL | Status: DC | PRN

## 2015-10-13 MED ORDER — METFORMIN HCL 500 MG PO TABS: 1000.0000 mg | ORAL_TABLET | Freq: Every day | ORAL | Status: DC

## 2015-10-13 MED ORDER — ACETAMINOPHEN 325 MG PO TABS: 650.0000 mg | ORAL_TABLET | ORAL | Status: DC | PRN

## 2015-10-13 MED ORDER — ROCURONIUM BROMIDE 100 MG/10ML IV SOLN
INTRAVENOUS | Status: DC | PRN
  Administered 2015-10-13: 30 mg via INTRAVENOUS
  Administered 2015-10-13: 50 mg via INTRAVENOUS

## 2015-10-13 MED ORDER — KETOROLAC TROMETHAMINE 15 MG/ML IJ SOLN
15.0000 mg | Freq: Four times a day (QID) | INTRAMUSCULAR | Status: DC
  Administered 2015-10-13 (×2): 15 mg via INTRAVENOUS
  Filled 2015-10-13 (×2): qty 1

## 2015-10-13 MED ORDER — AMLODIPINE BESYLATE 5 MG PO TABS: 5.0000 mg | ORAL_TABLET | Freq: Every day | ORAL | Status: DC

## 2015-10-13 MED ORDER — MAGNESIUM CITRATE PO SOLN: 1.0000 | Freq: Once | ORAL | Status: DC | PRN

## 2015-10-13 MED ORDER — HYDROCHLOROTHIAZIDE 25 MG PO TABS: 25.0000 mg | ORAL_TABLET | Freq: Every day | ORAL | Status: DC

## 2015-10-13 MED ORDER — SODIUM CHLORIDE 0.9% FLUSH: 3.0000 mL | Freq: Two times a day (BID) | INTRAVENOUS | Status: DC

## 2015-10-13 MED ORDER — FENTANYL CITRATE (PF) 250 MCG/5ML IJ SOLN
INTRAMUSCULAR | Status: AC
  Filled 2015-10-13: qty 5

## 2015-10-13 MED ORDER — HEMOSTATIC AGENTS (NO CHARGE) OPTIME
TOPICAL | Status: DC | PRN
  Administered 2015-10-13: 1 via TOPICAL

## 2015-10-13 MED ORDER — SODIUM CHLORIDE 0.9 % IJ SOLN
INTRAMUSCULAR | Status: AC
  Filled 2015-10-13: qty 10

## 2015-10-13 MED ORDER — METHOCARBAMOL 1000 MG/10ML IJ SOLN
500.0000 mg | Freq: Four times a day (QID) | INTRAVENOUS | Status: DC | PRN
  Filled 2015-10-13: qty 5

## 2015-10-13 MED ORDER — SODIUM CHLORIDE 0.9% FLUSH: 3.0000 mL | INTRAVENOUS | Status: DC | PRN

## 2015-10-13 MED ORDER — HYDROCODONE-ACETAMINOPHEN 5-325 MG PO TABS
1.0000 | ORAL_TABLET | Freq: Four times a day (QID) | ORAL | Status: DC | PRN
  Administered 2015-10-13: 2 via ORAL
  Filled 2015-10-13: qty 2

## 2015-10-13 MED ORDER — HYDROCODONE-ACETAMINOPHEN 5-325 MG PO TABS: 1.0000 | ORAL_TABLET | ORAL | Status: DC | PRN

## 2015-10-13 MED ORDER — HYDROCODONE-ACETAMINOPHEN 5-325 MG PO TABS: 1.0000 | ORAL_TABLET | Freq: Four times a day (QID) | ORAL | Status: DC | PRN

## 2015-10-13 MED ORDER — KETOROLAC TROMETHAMINE 30 MG/ML IJ SOLN
30.0000 mg | Freq: Once | INTRAMUSCULAR | Status: AC
  Administered 2015-10-13: 30 mg via INTRAVENOUS
  Filled 2015-10-13: qty 1

## 2015-10-13 MED ORDER — GLIPIZIDE ER 5 MG PO TB24
5.0000 mg | ORAL_TABLET | Freq: Every day | ORAL | Status: DC
  Filled 2015-10-13: qty 1

## 2015-10-13 MED ORDER — BISACODYL 10 MG RE SUPP: 10.0000 mg | Freq: Every day | RECTAL | Status: DC | PRN

## 2015-10-13 MED ORDER — OXYCODONE HCL 5 MG PO TABS: 5.0000 mg | ORAL_TABLET | Freq: Once | ORAL | Status: DC | PRN

## 2015-10-13 MED ORDER — MIDAZOLAM HCL 5 MG/5ML IJ SOLN
INTRAMUSCULAR | Status: DC | PRN
  Administered 2015-10-13: 2 mg via INTRAVENOUS

## 2015-10-13 MED ORDER — PRAVASTATIN SODIUM 40 MG PO TABS: 40.0000 mg | ORAL_TABLET | Freq: Every day | ORAL | Status: DC

## 2015-10-13 MED ORDER — PROPOFOL 10 MG/ML IV BOLUS
INTRAVENOUS | Status: DC | PRN
  Administered 2015-10-13: 200 mg via INTRAVENOUS

## 2015-10-13 MED ORDER — POLYETHYLENE GLYCOL 3350 17 G PO PACK: 17.0000 g | PACK | Freq: Every day | ORAL | Status: DC | PRN

## 2015-10-13 MED ORDER — PROPOFOL 10 MG/ML IV BOLUS
INTRAVENOUS | Status: AC
  Filled 2015-10-13: qty 20

## 2015-10-13 MED ORDER — KETOROLAC TROMETHAMINE 30 MG/ML IJ SOLN
INTRAMUSCULAR | Status: AC
  Filled 2015-10-13: qty 1

## 2015-10-13 MED ORDER — INSULIN ASPART 100 UNIT/ML ~~LOC~~ SOLN
0.0000 [IU] | Freq: Three times a day (TID) | SUBCUTANEOUS | Status: DC
  Administered 2015-10-13: 5 [IU] via SUBCUTANEOUS

## 2015-10-13 MED ORDER — OXYCODONE HCL 5 MG/5ML PO SOLN: 5.0000 mg | Freq: Once | ORAL | Status: DC | PRN

## 2015-10-13 MED ORDER — CANAGLIFLOZIN 300 MG PO TABS
300.0000 mg | ORAL_TABLET | Freq: Every day | ORAL | Status: DC
  Filled 2015-10-13: qty 1

## 2015-10-13 MED ORDER — LACTATED RINGERS IV SOLN
INTRAVENOUS | Status: DC | PRN
  Administered 2015-10-13 (×2): via INTRAVENOUS

## 2015-10-13 MED ORDER — ONDANSETRON HCL 4 MG/2ML IJ SOLN
INTRAMUSCULAR | Status: AC
  Filled 2015-10-13: qty 2

## 2015-10-13 MED ORDER — PHENOL 1.4 % MT LIQD: 1.0000 | OROMUCOSAL | Status: DC | PRN

## 2015-10-13 MED ORDER — HYDROMORPHONE HCL 1 MG/ML IJ SOLN
0.2500 mg | INTRAMUSCULAR | Status: DC | PRN
  Administered 2015-10-13 (×2): 0.5 mg via INTRAVENOUS

## 2015-10-13 MED ORDER — LOSARTAN POTASSIUM-HCTZ 100-25 MG PO TABS: 1.0000 | ORAL_TABLET | Freq: Every day | ORAL | Status: DC

## 2015-10-13 MED ORDER — DIAZEPAM 5 MG PO TABS: 5.0000 mg | ORAL_TABLET | Freq: Four times a day (QID) | ORAL | Status: DC | PRN

## 2015-10-13 MED ORDER — THROMBIN 5000 UNITS EX SOLR
OROMUCOSAL | Status: DC | PRN
  Administered 2015-10-13: 10 mL via TOPICAL

## 2015-10-13 MED ORDER — PIOGLITAZONE HCL 15 MG PO TABS
30.0000 mg | ORAL_TABLET | Freq: Every day | ORAL | Status: DC
  Filled 2015-10-13: qty 1

## 2015-10-13 MED ORDER — OXYCODONE-ACETAMINOPHEN 5-325 MG PO TABS
ORAL_TABLET | ORAL | Status: AC
  Administered 2015-10-13: 2 via ORAL
  Filled 2015-10-13: qty 2

## 2015-10-13 MED ORDER — METHOCARBAMOL 500 MG PO TABS
500.0000 mg | ORAL_TABLET | Freq: Four times a day (QID) | ORAL | Status: DC | PRN
  Administered 2015-10-13: 500 mg via ORAL
  Filled 2015-10-13: qty 1

## 2015-10-13 MED ORDER — ONDANSETRON HCL 4 MG/2ML IJ SOLN
INTRAMUSCULAR | Status: DC | PRN
  Administered 2015-10-13: 4 mg via INTRAVENOUS

## 2015-10-13 MED ORDER — NEOSTIGMINE METHYLSULFATE 10 MG/10ML IV SOLN
INTRAVENOUS | Status: DC | PRN
  Administered 2015-10-13: 3 mg via INTRAVENOUS

## 2015-10-13 MED ORDER — GLYCOPYRROLATE 0.2 MG/ML IJ SOLN
INTRAMUSCULAR | Status: DC | PRN
  Administered 2015-10-13: 0.4 mg via INTRAVENOUS

## 2015-10-13 MED ORDER — MENTHOL 3 MG MT LOZG: 1.0000 | LOZENGE | OROMUCOSAL | Status: DC | PRN

## 2015-10-13 MED ORDER — ALUM & MAG HYDROXIDE-SIMETH 200-200-20 MG/5ML PO SUSP: 30.0000 mL | Freq: Four times a day (QID) | ORAL | Status: DC | PRN

## 2015-10-13 MED ORDER — PANTOPRAZOLE SODIUM 40 MG PO TBEC: 40.0000 mg | DELAYED_RELEASE_TABLET | Freq: Every day | ORAL | Status: DC

## 2015-10-13 MED ORDER — 0.9 % SODIUM CHLORIDE (POUR BTL) OPTIME
TOPICAL | Status: DC | PRN
  Administered 2015-10-13: 1000 mL

## 2015-10-13 MED ORDER — GABAPENTIN 300 MG PO CAPS: 300.0000 mg | ORAL_CAPSULE | Freq: Three times a day (TID) | ORAL | Status: DC

## 2015-10-13 MED ORDER — MORPHINE SULFATE (PF) 2 MG/ML IV SOLN
1.0000 mg | INTRAVENOUS | Status: DC | PRN
Start: 2015-10-13 — End: 2015-10-13
  Administered 2015-10-13: 2 mg via INTRAVENOUS
  Filled 2015-10-13: qty 1

## 2015-10-13 MED ORDER — ROCURONIUM BROMIDE 50 MG/5ML IV SOLN
INTRAVENOUS | Status: AC
  Filled 2015-10-13: qty 1

## 2015-10-13 MED ORDER — LOSARTAN POTASSIUM 50 MG PO TABS: 100.0000 mg | ORAL_TABLET | Freq: Every day | ORAL | Status: DC

## 2015-10-13 MED ORDER — ONDANSETRON HCL 4 MG/2ML IJ SOLN
4.0000 mg | INTRAMUSCULAR | Status: DC | PRN
  Administered 2015-10-13: 4 mg via INTRAVENOUS
  Filled 2015-10-13: qty 2

## 2015-10-13 MED ORDER — SODIUM CHLORIDE 0.9 % IR SOLN
Status: DC | PRN
  Administered 2015-10-13: 500 mL

## 2015-10-13 SURGICAL SUPPLY — 49 items
ADH SKN CLS APL DERMABOND .7 (GAUZE/BANDAGES/DRESSINGS) ×1
BAG DECANTER FOR FLEXI CONT (MISCELLANEOUS) ×2 IMPLANT
BLADE CLIPPER SURG (BLADE) IMPLANT
BUR ACORN 6.0 (BURR) IMPLANT
BUR MATCHSTICK NEURO 3.0 LAGG (BURR) ×2 IMPLANT
CANISTER SUCT 3000ML PPV (MISCELLANEOUS) ×2 IMPLANT
DECANTER SPIKE VIAL GLASS SM (MISCELLANEOUS) ×2 IMPLANT
DERMABOND ADVANCED (GAUZE/BANDAGES/DRESSINGS) ×1
DERMABOND ADVANCED .7 DNX12 (GAUZE/BANDAGES/DRESSINGS) ×1 IMPLANT
DRAPE LAPAROTOMY 100X72X124 (DRAPES) ×2 IMPLANT
DRAPE MICROSCOPE LEICA (MISCELLANEOUS) IMPLANT
DRAPE POUCH INSTRU U-SHP 10X18 (DRAPES) ×2 IMPLANT
DRAPE PROXIMA HALF (DRAPES) IMPLANT
DRSG OPSITE POSTOP 4X6 (GAUZE/BANDAGES/DRESSINGS) ×1 IMPLANT
DURAPREP 26ML APPLICATOR (WOUND CARE) ×2 IMPLANT
ELECT REM PT RETURN 9FT ADLT (ELECTROSURGICAL) ×2
ELECTRODE REM PT RTRN 9FT ADLT (ELECTROSURGICAL) ×1 IMPLANT
GAUZE SPONGE 4X4 12PLY STRL (GAUZE/BANDAGES/DRESSINGS) ×2 IMPLANT
GAUZE SPONGE 4X4 16PLY XRAY LF (GAUZE/BANDAGES/DRESSINGS) IMPLANT
GLOVE BIOGEL PI IND STRL 8.5 (GLOVE) ×1 IMPLANT
GLOVE BIOGEL PI INDICATOR 8.5 (GLOVE) ×1
GLOVE ECLIPSE 8.5 STRL (GLOVE) ×2 IMPLANT
GLOVE EXAM NITRILE LRG STRL (GLOVE) IMPLANT
GLOVE EXAM NITRILE MD LF STRL (GLOVE) IMPLANT
GLOVE EXAM NITRILE XL STR (GLOVE) IMPLANT
GLOVE EXAM NITRILE XS STR PU (GLOVE) IMPLANT
GOWN STRL REUS W/ TWL LRG LVL3 (GOWN DISPOSABLE) IMPLANT
GOWN STRL REUS W/ TWL XL LVL3 (GOWN DISPOSABLE) IMPLANT
GOWN STRL REUS W/TWL 2XL LVL3 (GOWN DISPOSABLE) ×2 IMPLANT
GOWN STRL REUS W/TWL LRG LVL3 (GOWN DISPOSABLE)
GOWN STRL REUS W/TWL XL LVL3 (GOWN DISPOSABLE)
KIT BASIN OR (CUSTOM PROCEDURE TRAY) ×2 IMPLANT
KIT ROOM TURNOVER OR (KITS) ×2 IMPLANT
NDL SPNL 20GX3.5 QUINCKE YW (NEEDLE) IMPLANT
NEEDLE HYPO 22GX1.5 SAFETY (NEEDLE) ×2 IMPLANT
NEEDLE SPNL 20GX3.5 QUINCKE YW (NEEDLE) IMPLANT
NS IRRIG 1000ML POUR BTL (IV SOLUTION) ×2 IMPLANT
PACK LAMINECTOMY NEURO (CUSTOM PROCEDURE TRAY) ×2 IMPLANT
PAD ARMBOARD 7.5X6 YLW CONV (MISCELLANEOUS) ×6 IMPLANT
PATTIES SURGICAL .5 X1 (DISPOSABLE) ×2 IMPLANT
RUBBERBAND STERILE (MISCELLANEOUS) IMPLANT
SPONGE SURGIFOAM ABS GEL SZ50 (HEMOSTASIS) ×2 IMPLANT
SUT VIC AB 1 CT1 18XBRD ANBCTR (SUTURE) ×1 IMPLANT
SUT VIC AB 1 CT1 8-18 (SUTURE) ×2
SUT VIC AB 2-0 CP2 18 (SUTURE) ×2 IMPLANT
SUT VIC AB 3-0 SH 8-18 (SUTURE) ×2 IMPLANT
TOWEL OR 17X24 6PK STRL BLUE (TOWEL DISPOSABLE) ×2 IMPLANT
TOWEL OR 17X26 10 PK STRL BLUE (TOWEL DISPOSABLE) ×2 IMPLANT
WATER STERILE IRR 1000ML POUR (IV SOLUTION) ×2 IMPLANT

## 2015-10-13 NOTE — H&P (Signed)
Reginald Kirby. is an 40 y.o. male.   Chief Complaint: progressively worsening back and leg pain HPI: Patient is a 40 yo individual who has had progressively worsening back and leg pain for a couple of years. 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 Mri has demonstrated that he has elements of congenital stenosis along with disc degenerative changes at L3-4 L4-5 and L5S1. He has failed at efforts at conservative management. We had hoped to do decompressive laminotomies with coflex but this is not approved by his insurance company, He is now being admitted for simple decompressive laminotomies at L3-4 L4-5 and L5S1.  Past Medical History  Diagnosis Date  . Diabetes mellitus   . Hyperlipidemia   . Hypertension   . Sinusitis   . Depression   . Anxiety   . GERD (gastroesophageal reflux disease)   . Headache     HX MIGRAINES   . Arthritis     DDD  BACK    Past Surgical History  Procedure Laterality Date  . Epidural injections   2002  . Cardiac catheterization      2006    OKAY     No family history on file. Social History:  reports that he has never smoked. He has never used smokeless tobacco. He reports that he drinks alcohol. He reports that he does not use illicit drugs.  Allergies:  Allergies  Allergen Reactions  . Demerol Itching    Medications Prior to Admission  Medication Sig Dispense Refill  . amLODipine (NORVASC) 5 MG tablet Take 1 tablet (5 mg total) by mouth daily. 90 tablet 3  . DEXILANT 60 MG capsule TAKE 1 CAPSULE BY MOUTH DAILY. 90 capsule 2  . gabapentin (NEURONTIN) 300 MG capsule Take 1 capsule (300 mg total) by mouth 3 (three) times daily. 90 capsule 2  . glipiZIDE (GLUCOTROL XL) 5 MG 24 hr tablet TAKE 1 TABLET BY MOUTH ONCE DAILY 90 tablet 1  . HYDROcodone-acetaminophen (NORCO/VICODIN) 5-325 MG tablet Take 1 tablet by mouth every 6 (six) hours as needed for moderate pain. 30 tablet 0  . INVOKANA 300  MG TABS tablet TAKE 1 TABLET BY MOUTH DAILY BEFORE BREAKFAST. 30 tablet 5  . losartan-hydrochlorothiazide (HYZAAR) 100-25 MG per tablet TAKE 1 TABLET BY MOUTH DAILY. 90 tablet 2  . metFORMIN (GLUCOPHAGE) 1000 MG tablet TAKE 1 TABLET BY MOUTH DAILY WITH A MEAL 180 tablet 3  . pioglitazone (ACTOS) 30 MG tablet TAKE 1 TABLET BY MOUTH DAILY. 90 tablet 1  . pravastatin (PRAVACHOL) 40 MG tablet TAKE 1 TABLET BY MOUTH ONCE DAILY 90 tablet 0  . traMADol (ULTRAM) 50 MG tablet TAKE 1 TABLET BY MOUTH EVERY 6 HOURS AS NEEDED 90 tablet 2  . Blood Glucose Calibration (ACCU-CHEK INSTANT CONTROL) LIQD 1 each by In Vitro route 2 (two) times a week. 1 each 0  . glucose blood (FREESTYLE LITE) test strip 1 each by Other route 3 (three) times daily. Use as instructed 300 each 3    No results found for this or any previous visit (from the past 48 hour(s)). No results found.  Review of Systems  HENT: Negative.   Eyes: Negative.   Respiratory: Negative.   Cardiovascular: Negative.   Gastrointestinal: Negative.   Musculoskeletal: Positive for back pain.  Skin: Negative.   Neurological: Positive for sensory change, focal weakness and weakness.  Psychiatric/Behavioral: Negative.     There were no vitals  taken for this visit. Physical Exam  Constitutional: He is oriented to person, place, and time. He appears well-developed and well-nourished.  HENT:  Head: Normocephalic and atraumatic.  Eyes: Conjunctivae and EOM are normal. Pupils are equal, round, and reactive to light.  Neck: Normal range of motion. Neck supple.  Cardiovascular: Normal rate, regular rhythm and normal heart sounds.   Respiratory: Effort normal and breath sounds normal.  GI: Soft. Bowel sounds are normal.  Musculoskeletal:  Positive strait leg raising bilaterally Motor function is good with mild tibialis anterior weakess.  Neurological: He is alert and oriented to person, place, and time.  Absent dtrs.     Assessment/Plan Lumbar  spinal stenosis at L3-4 L4-5 and L5S1.Neurogenic claudication and lumbar radiculopathy.  Deompressive laminotomies L3-4 L4-5 L5S1  Stefani DamaELSNER,Donyetta Ogletree J, MD 10/13/2015, 5:44 AM

## 2015-10-13 NOTE — Evaluation (Signed)
Occupational Therapy Evaluation Patient Details Name: Reginald Islamrnest H Elman Jr. MRN: [id number] DOB: 07/15/1975 Today's Date: 10/13/2015    History of Present Illness Pt is a 40 y.o. male s/p decompressive laminotomies at L3-4 L4-5 and L5S1. PMHx: DM, HTN, Hyperlipidemia, Depression, Anxiety, GERD, Arithritis.    Clinical Impression   Pt reports he was independent with ADLs and used a cane for mobility PTA. Currently pt overall supervision for safety with functional mobility and ADLs with the exception of min-mod assist for LB ADLs. Provided back, safety, and ADL education to pt and wife; they verbalized understanding. Recommend 3 in 1 for use at home for increased safety. Pt planning to d/c home with 24/7 supervision from family. Pt ok to d/c home from OT standpoint but we will continue to follow pt acutely to address established goals.     Follow Up Recommendations  No OT follow up;Supervision - Intermittent    Equipment Recommendations  3 in 1 bedside comode    Recommendations for Other Services PT consult     Precautions / Restrictions Precautions Precautions: Back Precaution Booklet Issued: Yes (comment) Precaution Comments: Educated pt and wife on back precautions. Pt able to recall 3/3 at end of session. Required Braces or Orthoses: Spinal Brace Spinal Brace: Lumbar corset (Orders for "no brace needed" but brace present in room) Restrictions Weight Bearing Restrictions: No      Mobility Bed Mobility Overal bed mobility: Needs Assistance Bed Mobility: Rolling;Sidelying to Sit;Sit to Sidelying Rolling: Supervision Sidelying to sit: Supervision     Sit to sidelying: Supervision General bed mobility comments: Supervision for safety. HOB flat with use of bed rails. VCs for log roll technique.  Transfers Overall transfer level: Needs assistance Equipment used: None Transfers: Sit to/from Stand Sit to Stand: Supervision         General transfer comment: Supervision for  safety, no physical assist needed. Pt with good hand placement and technique.    Balance Overall balance assessment: Needs assistance Sitting-balance support: Feet supported;No upper extremity supported Sitting balance-Leahy Scale: Good     Standing balance support: No upper extremity supported;During functional activity Standing balance-Leahy Scale: Good                              ADL Overall ADL's : Needs assistance/impaired Eating/Feeding: Set up;Sitting   Grooming: Supervision/safety;Standing Grooming Details (indicate cue type and reason): Educated on use of 2 cups for oral care Upper Body Bathing: Set up;Supervision/ safety;Sitting   Lower Body Bathing: Minimal assistance;Sit to/from stand   Upper Body Dressing : Supervision/safety;Set up;Sitting Upper Body Dressing Details (indicate cue type and reason): to don/doff brace Lower Body Dressing: Moderate assistance;Sit to/from stand Lower Body Dressing Details (indicate cue type and reason): Pt unable to cross foot over opposite knee. Reports wife or children can assist with LB dressing as needed. Toilet Transfer: Supervision/safety;Ambulation;BSC (BSC over toilet) Toilet Transfer Details (indicate cue type and reason): Educated on use of 3 in 1 over toilet Toileting- Clothing Manipulation and Hygiene: Supervision/safety;Sit to/from stand Toileting - Clothing Manipulation Details (indicate cue type and reason): Pt able to reach bottom without twising. Educated on use of toilet tongs if needed upon return home.   Tub/Shower Transfer Details (indicate cue type and reason): Educated on use of 3 in 1 or shower chair for safety with bathing. Encouraged pt to sit down initially for safety and energy conservation. Functional mobility during ADLs: Supervision/safety General ADL Comments: Educated pt and  wife on maintaining back precautions during functional activities, log roll technique for bed mobility, donning/doffing  back brace, not sitting up for more than 45 minutes at a time-walking or laying down best, positioning in bed-if laying on side need pillow between legs. Pt able to ambulate in hallway and manage flight of stairs with supervision for safety. Pt required 2 standing rest breaks during functional mobility in hallway.      Vision Vision Assessment?: No apparent visual deficits   Perception     Praxis      Pertinent Vitals/Pain Pain Assessment: Faces Faces Pain Scale: Hurts little more Pain Location: L buttocks with mobility Pain Descriptors / Indicators: Sore Pain Intervention(s): Limited activity within patient's tolerance;Monitored during session;Repositioned;Premedicated before session     Hand Dominance     Extremity/Trunk Assessment Upper Extremity Assessment Upper Extremity Assessment: Overall WFL for tasks assessed   Lower Extremity Assessment Lower Extremity Assessment: Defer to PT evaluation       Communication Communication Communication: No difficulties   Cognition Arousal/Alertness: Awake/alert Behavior During Therapy: WFL for tasks assessed/performed Overall Cognitive Status: Within Functional Limits for tasks assessed                     General Comments       Exercises       Shoulder Instructions      Home Living Family/patient expects to be discharged to:: Private residence Living Arrangements: Spouse/significant other Available Help at Discharge: Family;Available 24 hours/day Type of Home: House Home Access: Level entry     Home Layout: Two level;Bed/bath upstairs Alternate Level Stairs-Number of Steps: flight Alternate Level Stairs-Rails: Left Bathroom Shower/Tub: Tub/shower unit Shower/tub characteristics: Curtain Firefighter: Standard     Home Equipment: Cane - single point;Adaptive equipment Adaptive Equipment: Reacher Additional Comments: Mother in law may have shower chair and 3 in 1 pt can borrow.      Prior  Functioning/Environment Level of Independence: Independent with assistive device(s)        Comments: used cane for mobility PTA. 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    OT Diagnosis: Generalized weakness;Acute pain   OT Problem List: Impaired balance (sitting and/or standing);Decreased knowledge of use of DME or AE;Decreased knowledge of precautions;Pain   OT Treatment/Interventions: Self-care/ADL training;Energy conservation;DME and/or AE instruction;Patient/family education;Balance training    OT Goals(Current goals can be found in the care plan section) Acute Rehab OT Goals Patient Stated Goal: home OT Goal Formulation: With patient/family Time For Goal Achievement: 10/27/15 Potential to Achieve Goals: Good ADL Goals Pt Will Perform Tub/Shower Transfer: Tub transfer;with supervision;3 in 1;ambulating Additional ADL Goal #1: Pt will independently verbally recall 3/3 back precautions and maintain during ADL. Additional ADL Goal #2: Pt will independently don/doff back brace as precursor for ADLs and functional mobility.  OT Frequency: Min 2X/week   Barriers to D/C: Inaccessible home environment  bed and bathroom on 2nd floor of house       Co-evaluation              End of Session Equipment Utilized During Treatment: Back brace Nurse Communication: Mobility status;Other (comment) (needs 3 in 1 for home)  Activity Tolerance: Patient tolerated treatment well Patient left: in bed;with call bell/phone within reach;with family/visitor present   Time: 1610-9604 OT Time Calculation (min): 31 min Charges:  OT General Charges $OT Visit: 1 Procedure OT Evaluation $OT Eval Moderate Complexity: 1 Procedure OT Treatments $Self Care/Home Management : 8-22 mins G-Codes: OT G-codes **NOT FOR INPATIENT  CLASS** Functional Assessment Tool Used: Clinical judgement Functional Limitation: Self care Self Care Current Status (Z6109): At least 1 percent but less than 20 percent impaired, limited or  restricted Self Care Goal Status (U0454): At least 1 percent but less than 20 percent impaired, limited or restricted   Gaye Alken M.S., OTR/L Pager: [phone]  10/13/2015, 4:55 PM

## 2015-10-13 NOTE — Op Note (Signed)
Date of surgery: 10/05/2015 Preoperative diagnosis: #1 congenital spinal stenosis #2 herniated nucleus pulposus his L3-4 L4-5 L5-S1 #3 radiculopathy Postoperative diagnosis: Same Procedure: Bilateral laminotomies and decompression at L3-4 L4-5 L5-S1, decompression of the L3 L4 L5 and S1 nerve roots bilaterally and laterally. Microdiscectomy L3-4 left Surgeon: Barnett AbuHenry Doralee Kocak First assistant: Radonna RickerAL Cabell M.D. Anesthesia: Gen. endotracheal Indications: Reginald Kirby is a 40 year old individual who's had significant back and bilateral lower extremity pain. In addition to congenital spinal stenosis he has developed the disc herniations at L3-4 L4-5 and L5-S1. This causes severe lumbar radicular pain and weakness in his lower extremities. Min advised regarding the need for surgical decompression and initially was planned that in addition to the decompression you have Coflex device is placed however this was disallowed by his insurance company. Is therefore undergoing simple laminotomies and foraminotomies with decompression of the individual nerve roots.  Procedure: The patient was brought to the operating room supine on a stretcher. After the smooth induction of general endotracheal anesthesia, he was turned prone. The back was prepped with alcohol and DuraPrep and draped in a sterile fashion. Midline incision was created and carried down to the lumbar dorsal fascia. Then a subperiosteal dissection was performed first on the left side interlaminar spaces at L3-4 and L4-5 were uncovered. These were identified positively on the radiograph. Then by dissecting a bit further L5-S1 was uncovered. Laminotomies were then created at each of these levels removing the inferior margin lamina of L3 at the L3-4 level to expose the common dural tube. The dissection was carried out laterally until the edge of the dura could be identified and the dura was retracted medially. The area underlying the disc space was uncovered and at  L3-4 there is noted to be a soft disc herniation with a thin veil of ligamentous material over it. This was readily entered and a substantial fragment of disc material was removed from this area hemostasis was readily obtained. In the end the L3 and the L4 nerve roots were well decompressed. Next on the left side the L4-5 laminotomy was created and the common dural tube was decompressed along with the take off of the L5 nerve root inferiorly. Here there was noted to be a large calcified disc herniation is bowing the common dural tube cephalad unless F doing laminotomy and foraminotomies this area as well decompressed. Attention was then turned to L5-S1 on the left side where similar laminotomy and foraminotomies was carried out again over a large calcified disc herniation in this region/good decompression of the S1 nerve root. There is no looseness in the disc space itself with this decompression.  Attention next the retractor was placed on the right side and a subperiosteal dissection was performed at L3-4 L4-5 and L5-S1 on the right side here similar laminotomies were carried out and at each level calcified disc material was encountered but no soft tissue disc herniation. The path the L3 nerve root superiorly was decompressed as was the L4 nerve root at the L3-4 level the L4 and L5 nerve roots were decompressed laterally at the L4-5 level and the L5 and S1 nerve roots were decompressed laterally at L5-S1 on the right side. In the end the nerve roots and the common dural tube wall well decompressed and free and clear in the stasis was achieved meticulously in this region and then the lumbar dorsal fascia was closed with #1 Vicryl interrupted fashion, 2-0 Vicryl was used in the subcutaneous anus tissues, 3-0 Vicryl to close the subcutaneous  take her skin. Blood loss was estimated 125 mL. Patient tolerated procedure well and returned to recovery room in stable condition.

## 2015-10-13 NOTE — Discharge Summary (Signed)
Physician Discharge Summary  Patient ID: Reginald Kirby. MRN: [id number] DOB/AGE: 1976/01/31 40 y.o.  Admit date: 10/13/2015 Discharge date: 10/13/2015  Admission Diagnoses:Lumbar stenosis and radiculopathy L3-4, L4-5 and L5S1  Discharge Diagnoses: Lumbar stenosis and radiculopathy with herniated nucleus pulposisL3-4. Congenital spinal stenosis, Diabetes melitus Active Problems:   Lumbar radiculopathy   Discharged Condition: good  Hospital Course: Patient tolerated surgery well. Legs feel better  Consults: None  Significant Diagnostic Studies: none  Treatments: surgery:   Discharge Exam: Blood pressure 141/76, pulse 95, temperature 98.5 F (36.9 C), temperature source Oral, resp. rate 18, height 6' (1.829 m), weight 114.851 kg (253 lb 3.2 oz), SpO2 99 %. incision clean and dry, station and gait intact  Disposition: 01-Home or Self Care  Discharge Instructions    Call MD for:  redness, tenderness, or signs of infection (pain, swelling, redness, odor or green/yellow discharge around incision site)    Complete by:  As directed      Call MD for:  severe uncontrolled pain    Complete by:  As directed      Call MD for:  temperature >100.4    Complete by:  As directed      Diet - low sodium heart healthy    Complete by:  As directed      Discharge instructions    Complete by:  As directed   Okay to shower. Do not apply salves or appointments to incision. No heavy lifting with the upper extremities greater than 15 pounds. May resume driving when not requiring pain medication and patient feels comfortable with doing so.     Increase activity slowly    Complete by:  As directed             Medication List    TAKE these medications        diazepam 5 MG tablet  Commonly known as:  VALIUM  Take 1 tablet (5 mg total) by mouth every 6 (six) hours as needed for muscle spasms.     HYDROcodone-acetaminophen 5-325 MG tablet  Commonly known as:  NORCO/VICODIN  Take 1-2 tablets  by mouth every 6 (six) hours as needed for moderate pain.      ASK your doctor about these medications        ACCU-CHEK INSTANT CONTROL Liqd  1 each by In Vitro route 2 (two) times a week.     amLODipine 5 MG tablet  Commonly known as:  NORVASC  Take 1 tablet (5 mg total) by mouth daily.     DEXILANT 60 MG capsule  Generic drug:  dexlansoprazole  TAKE 1 CAPSULE BY MOUTH DAILY.     gabapentin 300 MG capsule  Commonly known as:  NEURONTIN  Take 1 capsule (300 mg total) by mouth 3 (three) times daily.     glipiZIDE 5 MG 24 hr tablet  Commonly known as:  GLUCOTROL XL  TAKE 1 TABLET BY MOUTH ONCE DAILY     glucose blood test strip  Commonly known as:  FREESTYLE LITE  1 each by Other route 3 (three) times daily. Use as instructed     INVOKANA 300 MG Tabs tablet  Generic drug:  canagliflozin  TAKE 1 TABLET BY MOUTH DAILY BEFORE BREAKFAST.     losartan-hydrochlorothiazide 100-25 MG tablet  Commonly known as:  HYZAAR  TAKE 1 TABLET BY MOUTH DAILY.     metFORMIN 1000 MG tablet  Commonly known as:  GLUCOPHAGE  TAKE 1 TABLET BY MOUTH DAILY WITH A  MEAL     pioglitazone 30 MG tablet  Commonly known as:  ACTOS  TAKE 1 TABLET BY MOUTH DAILY.     pravastatin 40 MG tablet  Commonly known as:  PRAVACHOL  TAKE 1 TABLET BY MOUTH ONCE DAILY     traMADol 50 MG tablet  Commonly known as:  ULTRAM  TAKE 1 TABLET BY MOUTH EVERY 6 HOURS AS NEEDED         Signed: Stefani Dama 10/13/2015, 6:11 PM

## 2015-10-13 NOTE — Anesthesia Procedure Notes (Addendum)
Procedure Name: Intubation Date/Time: 10/13/2015 7:43 AM Performed by: Quentin OreWALKER, Reginald Colantonio E Pre-anesthesia Checklist: Patient identified, Emergency Drugs available, Suction available, Patient being monitored and Timeout performed Patient Re-evaluated:Patient Re-evaluated prior to inductionOxygen Delivery Method: Circle system utilized Preoxygenation: Pre-oxygenation with 100% oxygen Intubation Type: IV induction Ventilation: Mask ventilation without difficulty Laryngoscope Size: Glidescope Grade View: Grade I Tube type: Oral Tube size: 7.5 mm Number of attempts: 1 Airway Equipment and Method: Stylet Placement Confirmation: ETT inserted through vocal cords under direct vision,  positive ETCO2 and breath sounds checked- equal and bilateral Secured at: 22 cm Tube secured with: Tape Dental Injury: Teeth and Oropharynx as per pre-operative assessment  Comments: Electively used Glidescope. Grade I view on screen.

## 2015-10-13 NOTE — Anesthesia Preprocedure Evaluation (Addendum)

## 2015-10-13 NOTE — Progress Notes (Signed)
Patient alert and oriented, mae's well, voiding adequate amount of urine, swallowing without difficulty, c/o mild pain. Patient discharged home with family. Script and discharged instructions given to patient. Patient and family stated understanding of d/c instructions given and has an appointment with MD.   

## 2015-10-13 NOTE — Transfer of Care (Signed)
Immediate Anesthesia Transfer of Care Note  Patient: Reginald Islamrnest H Achord Jr.  Procedure(s) Performed: Procedure(s) with comments: L3-4 L4-5 L5-S1 Laminectomy (N/A) - L3-4 L4-5 L5-S1 Laminectomy  Patient Location: PACU  Anesthesia Type:General  Level of Consciousness: sedated  Airway & Oxygen Therapy: Patient Spontanous Breathing and Patient connected to nasal cannula oxygen  Post-op Assessment: Report given to RN  Post vital signs: Reviewed and stable  Last Vitals:  Filed Vitals:   10/13/15 0551  BP: 140/93  Pulse: 86  Temp: 37.2 C  Resp: 18    Last Pain:  Filed Vitals:   10/13/15 1046  PainSc: 3       Patients Stated Pain Goal: 3 (10/13/15 0551)  Complications: No apparent anesthesia complications

## 2015-10-13 NOTE — Anesthesia Postprocedure Evaluation (Signed)
Anesthesia Post Note  Patient: Reginald Islamrnest H Majewski Jr.  Procedure(s) Performed: Procedure(s) (LRB): L3-4 L4-5 L5-S1 Laminectomy (N/A)  Patient location during evaluation: PACU Anesthesia Type: General Level of consciousness: awake, awake and alert and oriented Pain management: pain level controlled Vital Signs Assessment: post-procedure vital signs reviewed and stable Respiratory status: nonlabored ventilation and respiratory function stable Cardiovascular status: blood pressure returned to baseline Anesthetic complications: no    Last Vitals:  Filed Vitals:   10/13/15 1200 10/13/15 1221  BP:  141/76  Pulse: 88 95  Temp:  36.9 C  Resp:  18    Last Pain:  Filed Vitals:   10/13/15 1544  PainSc: 6                  Atwell Mcdanel COKER

## 2015-10-16 ENCOUNTER — Encounter (HOSPITAL_COMMUNITY): Payer: Self-pay | Admitting: Neurological Surgery

## 2015-10-17 ENCOUNTER — Other Ambulatory Visit: Payer: Self-pay | Admitting: Family Medicine

## 2015-10-17 MED ORDER — CANAGLIFLOZIN 300 MG PO TABS: ORAL_TABLET | ORAL | Status: DC

## 2015-10-17 MED ORDER — AMLODIPINE BESYLATE 5 MG PO TABS: 5.0000 mg | ORAL_TABLET | Freq: Every day | ORAL | Status: DC

## 2015-10-24 ENCOUNTER — Other Ambulatory Visit: Payer: Self-pay | Admitting: Internal Medicine

## 2015-10-26 ENCOUNTER — Other Ambulatory Visit: Payer: Self-pay | Admitting: Family Medicine

## 2015-10-26 MED ORDER — LOSARTAN POTASSIUM-HCTZ 100-25 MG PO TABS: 1.0000 | ORAL_TABLET | Freq: Every day | ORAL | Status: DC

## 2015-11-02 ENCOUNTER — Other Ambulatory Visit: Payer: Self-pay | Admitting: Internal Medicine

## 2015-11-03 MED ORDER — PRAVASTATIN SODIUM 40 MG PO TABS: 40.0000 mg | ORAL_TABLET | Freq: Every day | ORAL | Status: DC

## 2015-12-04 ENCOUNTER — Ambulatory Visit: Payer: 59 | Attending: Neurological Surgery

## 2015-12-04 DIAGNOSIS — M25651 Stiffness of right hip, not elsewhere classified: Secondary | ICD-10-CM | POA: Diagnosis not present

## 2015-12-04 DIAGNOSIS — R252 Cramp and spasm: Secondary | ICD-10-CM | POA: Diagnosis not present

## 2015-12-04 DIAGNOSIS — M5442 Lumbago with sciatica, left side: Secondary | ICD-10-CM | POA: Insufficient documentation

## 2015-12-04 DIAGNOSIS — M25652 Stiffness of left hip, not elsewhere classified: Secondary | ICD-10-CM | POA: Diagnosis not present

## 2015-12-04 DIAGNOSIS — M6281 Muscle weakness (generalized): Secondary | ICD-10-CM | POA: Insufficient documentation

## 2015-12-04 DIAGNOSIS — R293 Abnormal posture: Secondary | ICD-10-CM | POA: Diagnosis not present

## 2015-12-04 NOTE — Therapy (Addendum)
[place], [place], [address] Phone: [phone]   Fax:  [phone]  Physical Therapy Evaluation/ Discharge  Patient Details  Name: Reginald Kirby. MRN: [id number] Date of Birth: 1976/01/26 Referring Provider: Kristeen Miss, MD  Encounter Date: 12/04/2015      PT End of Session - 12/04/15 0856    Visit Number 1   Number of Visits 16   Date for PT Re-Evaluation 01/29/16   Authorization Type [employer]   PT Start Time 0845   PT Stop Time 0930   PT Time Calculation (min) 45 min   Activity Tolerance Patient tolerated treatment well   Behavior During Therapy [facility] for tasks assessed/performed      Past Medical History  Diagnosis Date  . Diabetes mellitus   . Hyperlipidemia   . Hypertension   . Sinusitis   . Depression   . Anxiety   . GERD (gastroesophageal reflux disease)   . Headache     HX MIGRAINES   . Arthritis     DDD  BACK    Past Surgical History  Procedure Laterality Date  . Epidural injections   2002  . Cardiac catheterization      2006    OKAY   . Lumbar laminectomy/decompression microdiscectomy N/A 10/13/2015    Procedure: L3-4 L4-5 L5-S1 Laminectomy;  Surgeon: Kristeen Miss, MD;  Location: [facility];  Service: Neurosurgery;  Laterality: N/A;  L3-4 L4-5 L5-S1 Laminectomy    There were no vitals filed for this visit.       Subjective Assessment - 12/04/15 0857    Subjective Long history of back pain starting 2004 with lifting.  He had some brief PT in past for this. 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 . Eventually he bagn to have paini  in both legs in 2015 that was releived with sitting. Fall 2016  01/2015 he stoped working and eventually required laminectomy.     Limitations Lifting   How long can you sit comfortably? As needed with adjustment of position  up to 6 hours   How long  can you stand comfortably? 60 min max   How long can you walk comfortably? 20 -30 min    Patient Stated Goals To releive LT side pain.    Currently in Pain? Yes   Pain Score 3    Pain Location Back   Pain Orientation Lower;Left   Pain Descriptors / Indicators Burning;Tightness   Pain Radiating Towards  LT thighfrom buttock to LT thigh.    Pain Onset More than a month ago   Pain Frequency Intermittent   Aggravating Factors  walking, standing   Pain Relieving Factors sit , lying, medication   Multiple Pain Sites No            [facility] PT Assessment - 12/04/15 0848    Assessment   Medical Diagnosis lumbar laminectomy L3-4,4-5 L5-S1   Referring Provider Kristeen Miss, MD   Onset Date/Surgical Date 10/13/15   Hand Dominance Right   Next MD Visit 12/28/15   Prior Therapy No. He had PT in past    Precautions   Precautions Back   Precaution Comments No stooping or bending   Restrictions   Weight Bearing Restrictions No   Balance Screen   Has the patient fallen in the past 6 months No   Has  the patient had a decrease in activity level because of a fear of falling?  No   Is the patient reluctant to leave their home because of a fear of falling?  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 Fair abdominal contraction and needed cuing to do posterioir pelvic tilt correct.    Flexibility   Soft Tissue Assessment /Muscle Length yes   Hamstrings 40 degrees SLR RT and LT   Quadriceps Prone lye RT 105   LT 95     Hip IR 15 degrees bilaterally   Palpation   Palpation comment tender RT gluteals/ buttock and left lower lumbar spine.    Ambulation/Gait   Gait Comments WNL                           PT Education - 12/04/15 0934    Education provided Yes   Education Details POC, PPT, dry needle info   Person(s) Educated Patient   Methods Explanation;Demonstration;Tactile cues;Verbal cues;Handout   Comprehension Returned demonstration;Verbalized understanding          PT Short Term Goals - 12/04/15 1341    PT SHORT TERM GOAL #1   Title He will be independent with inital HEP    Time 4   Period Weeks   Status New   PT SHORT TERM GOAL #2   Title He will report his thigh symptoms decr 50% when walking and standing   Time 4   Period Weeks   Status New   PT SHORT TERM GOAL #3   Title He will improve his trun ROM  by 10 degrees in all directions   Time 4   Period Weeks   Status New           PT Long Term Goals - 12/04/15 1343    PT LONG TERM GOAL #1   Title He will perform all HEP issued    Time 8   Period Weeks   Status New   PT LONG TERM GOAL #2   Title He will report LT thigh and back symptoms  intermittant when standing and walking. 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 He is 6 weeks post and reports he is still on limited lifting and bending precautions. He reports he fatigue easy as he has not done much since onset of pain in 02/2015 and he has been out of work since then. He also reports continued symptoms into his LT thigh  that appear to at least somewhat postional in inature with higher ntensity level with weight bearing on feet increaseing pain and sitting and lying easing pain   Rehab Potential Good   PT Frequency 2x / week   PT Duration 6 weeks   PT Treatment/Interventions Cryotherapy;Electrical Stimulation;Ultrasound;Therapeutic activities;Therapeutic exercise;Manual techniques;Taping;Dry needling;Passive range of motion;Patient/family education   PT Next Visit Plan Stretching for hips, review pelvic tilt, bike or nustep, modalities PRN   PT Home Exercise Plan posterior pelvic tilt   Consulted and Agree with Plan of Care Patient      Patient will benefit from skilled therapeutic intervention in order to improve the following deficits and impairments:  Pain, Decreased activity tolerance, Decreased strength, Decreased range of motion, Increased muscle spasms, Postural dysfunction  Visit Diagnosis: Left-sided low back pain with left-sided sciatica  Abnormal posture  Muscle weakness (generalized)  Stiffness of left hip, not elsewhere classified  Stiffness of right hip, not elsewhere classified  Cramp and spasm     Problem List Patient Active Problem List   Diagnosis Date Noted  . Lumbar radiculopathy 10/13/2015  . Family history of colon cancer 07/06/2015  . Diabetes mellitus without complication ([place]) 53/29/9242  . Lumbar pain 01/21/2015  . Diabetes mellitus ([place]) 10/23/2011  . Dyslipidemia 10/23/2011  . Hypertension 10/23/2011    Darrel Hoover PT  12/04/2015, 1:54 PM  [facility] [address] [place], [place], [address] Phone: [phone]   Fax:  [phone]  Name: Reginald Kirby. MRN: [id number] Date of Birth: 27-Aug-1975   PHYSICAL THERAPY DISCHARGE SUMMARY  Visits from Start of Care: eval only  Current functional level related to goals / functional outcomes: Unknown as pt canceled scheduled visits and did not return   Remaining deficits: Unknown   Education / Equipment: NA Plan:                                                    Patient goals were not met. Patient is being discharged due to not returning since the last visit.  ?????   Darrel Hoover, PT  02/12/16  9:08 AM

## 2015-12-04 NOTE — Patient Instructions (Signed)
From cabinet issued Posterior pelvic tilt ( 2-3 x per day 10-15 reps  5 sec hold) and dry needle info.

## 2015-12-14 ENCOUNTER — Encounter: Payer: 59 | Admitting: Physical Therapy

## 2015-12-28 ENCOUNTER — Other Ambulatory Visit: Payer: Self-pay | Admitting: Emergency Medicine

## 2015-12-28 ENCOUNTER — Other Ambulatory Visit: Payer: Self-pay | Admitting: Neurological Surgery

## 2015-12-28 DIAGNOSIS — M5416 Radiculopathy, lumbar region: Secondary | ICD-10-CM

## 2015-12-28 MED ORDER — GLIPIZIDE ER 5 MG PO TB24: 5.0000 mg | ORAL_TABLET | Freq: Every day | ORAL | Status: DC

## 2016-01-06 ENCOUNTER — Ambulatory Visit
Admission: RE | Admit: 2016-01-06 | Discharge: 2016-01-06 | Disposition: A | Discharge status: Home / Self Care | Payer: 59 | Source: Ambulatory Visit | Attending: Neurological Surgery | Admitting: Neurological Surgery

## 2016-01-06 DIAGNOSIS — M545 Low back pain: Secondary | ICD-10-CM | POA: Diagnosis not present

## 2016-01-06 DIAGNOSIS — M5416 Radiculopathy, lumbar region: Secondary | ICD-10-CM

## 2016-01-06 MED ORDER — GADOBENATE DIMEGLUMINE 529 MG/ML IV SOLN
20.0000 mL | Freq: Once | INTRAVENOUS | Status: AC | PRN
  Administered 2016-01-06: 20 mL via INTRAVENOUS

## 2016-01-17 DIAGNOSIS — M5416 Radiculopathy, lumbar region: Secondary | ICD-10-CM | POA: Diagnosis not present

## 2016-01-22 ENCOUNTER — Other Ambulatory Visit: Payer: Self-pay | Admitting: Internal Medicine

## 2016-02-15 ENCOUNTER — Other Ambulatory Visit: Payer: Self-pay | Admitting: Internal Medicine

## 2016-02-23 ENCOUNTER — Ambulatory Visit (INDEPENDENT_AMBULATORY_CARE_PROVIDER_SITE_OTHER): Payer: 59 | Admitting: Internal Medicine

## 2016-02-23 ENCOUNTER — Encounter: Payer: Self-pay | Admitting: Internal Medicine

## 2016-02-23 VITALS — BP 124/80 | HR 76 | Temp 98.3°F | Resp 20 | Ht 72.0 in | Wt 251.0 lb

## 2016-02-23 DIAGNOSIS — E785 Hyperlipidemia, unspecified: Secondary | ICD-10-CM | POA: Diagnosis not present

## 2016-02-23 DIAGNOSIS — I10 Essential (primary) hypertension: Secondary | ICD-10-CM

## 2016-02-23 DIAGNOSIS — R7989 Other specified abnormal findings of blood chemistry: Secondary | ICD-10-CM | POA: Diagnosis not present

## 2016-02-23 DIAGNOSIS — E119 Type 2 diabetes mellitus without complications: Secondary | ICD-10-CM

## 2016-02-23 LAB — LIPID PANEL
CHOLESTEROL: 250 mg/dL — AB (ref 0–200)
HDL: 42 mg/dL (ref >39.00)
NONHDL: 208.38
TRIGLYCERIDES: 211 mg/dL — AB (ref 0.0–149.0)
Total CHOL/HDL Ratio: 6
VLDL: 42.2 mg/dL — ABNORMAL HIGH (ref 0.0–40.0)

## 2016-02-23 LAB — HEMOGLOBIN A1C: Hgb A1c MFr Bld: 7.2 % — ABNORMAL HIGH (ref 4.6–6.5)

## 2016-02-23 LAB — LDL CHOLESTEROL, DIRECT: LDL DIRECT: 148 mg/dL

## 2016-02-23 NOTE — Progress Notes (Signed)
Pre visit review using our clinic review tool, if applicable. No additional management support is needed unless otherwise documented below in the visit note. 

## 2016-02-23 NOTE — Progress Notes (Signed)
Subjective:    Patient ID: Reginald IslamErnest H Tufo Jr., male    DOB: 1975-09-07, 40 y.o.   MRN: [id number]  HPI  40 year old patient seen today in follow-up.  He is status post laminectomy in April and has had a nice clinical response. He has type 2 diabetes and hypertension. He also has a history of dyslipidemia and has held pravastatin for the past 2 months  He has enjoyed nice glycemic control No new concerns or complaints  Past Medical History:  Diagnosis Date  . Anxiety   . Arthritis    DDD  BACK  . Depression   . Diabetes mellitus   . GERD (gastroesophageal reflux disease)   . Headache    HX MIGRAINES   . Hyperlipidemia   . Hypertension   . Sinusitis      Social History   Social History  . Marital status: Married    Spouse name: N/A  . Number of children: 3  . Years of education: N/A   Occupational History  . RN-- [employer]    Social History Main Topics  . Smoking status: Never Smoker  . Smokeless tobacco: Never Used  . Alcohol use Yes     Comment: occ.   . Drug use: No  . Sexual activity: Not on file   Other Topics Concern  . Not on file   Social History Narrative  . No narrative on file    Past Surgical History:  Procedure Laterality Date  . CARDIAC CATHETERIZATION     2006    OKAY   . epidural injections   2002  . LUMBAR LAMINECTOMY/DECOMPRESSION MICRODISCECTOMY N/A 10/13/2015   Procedure: L3-4 L4-5 L5-S1 Laminectomy;  Surgeon: Barnett AbuHenry Elsner, MD;  Location: [facility];  Service: Neurosurgery;  Laterality: N/A;  L3-4 L4-5 L5-S1 Laminectomy    No family history on file.  Allergies  Allergen Reactions  . Demerol Itching    Current Outpatient Prescriptions on File Prior to Visit  Medication Sig Dispense Refill  . amLODipine (NORVASC) 5 MG tablet Take 1 tablet (5 mg total) by mouth daily. 90 tablet 3  . Blood Glucose Calibration (ACCU-CHEK INSTANT CONTROL) LIQD 1 each by In Vitro route 2 (two) times a week. 1 each 0  . canagliflozin (INVOKANA)  300 MG TABS tablet TAKE 1 TABLET BY MOUTH DAILY BEFORE BREAKFAST. 30 tablet 5  . DEXILANT 60 MG capsule TAKE 1 CAPSULE BY MOUTH DAILY. 90 capsule 2  . diazepam (VALIUM) 5 MG tablet Take 1 tablet (5 mg total) by mouth every 6 (six) hours as needed for muscle spasms. 60 tablet 0  . gabapentin (NEURONTIN) 300 MG capsule TAKE 1 CAPSULE BY MOUTH 3 TIMES DAILY. 90 capsule 3  . glipiZIDE (GLUCOTROL XL) 5 MG 24 hr tablet Take 1 tablet (5 mg total) by mouth daily. 90 tablet 1  . glucose blood (FREESTYLE LITE) test strip 1 each by Other route 3 (three) times daily. Use as instructed 300 each 3  . HYDROcodone-acetaminophen (NORCO/VICODIN) 5-325 MG tablet Take 1-2 tablets by mouth every 6 (six) hours as needed for moderate pain. 60 tablet 0  . losartan-hydrochlorothiazide (HYZAAR) 100-25 MG tablet TAKE 1 TABLET BY MOUTH DAILY. 90 tablet 1  . metFORMIN (GLUCOPHAGE) 1000 MG tablet TAKE 1 TABLET BY MOUTH DAILY WITH A MEAL 180 tablet 3  . pioglitazone (ACTOS) 30 MG tablet TAKE 1 TABLET BY MOUTH DAILY. 90 tablet 1  . traMADol (ULTRAM) 50 MG tablet TAKE 1 TABLET BY MOUTH EVERY  6 HOURS AS NEEDED 90 tablet 2   No current facility-administered medications on file prior to visit.     BP 124/80 (BP Location: Right Arm, Patient Position: Sitting, Cuff Size: Large)   Pulse 76   Temp 98.3 F (36.8 C) (Oral)   Resp 20   Ht 6' (1.829 m)   Wt 251 lb (113.9 kg)   SpO2 98%   BMI 34.04 kg/m     Review of Systems  Constitutional: Negative for appetite change, chills, fatigue and fever.  HENT: Negative for congestion, dental problem, ear pain, hearing loss, sore throat, tinnitus, trouble swallowing and voice change.   Eyes: Negative for pain, discharge and visual disturbance.  Respiratory: Negative for cough, chest tightness, wheezing and stridor.   Cardiovascular: Negative for chest pain, palpitations and leg swelling.  Gastrointestinal: Negative for abdominal distention, abdominal pain, blood in stool,  constipation, diarrhea, nausea and vomiting.  Genitourinary: Negative for difficulty urinating, discharge, flank pain, genital sores, hematuria and urgency.  Musculoskeletal: Negative for arthralgias, back pain, gait problem, joint swelling, myalgias and neck stiffness.  Skin: Negative for rash.  Neurological: Negative for dizziness, syncope, speech difficulty, weakness, numbness and headaches.  Hematological: Negative for adenopathy. Does not bruise/bleed easily.  Psychiatric/Behavioral: Negative for behavioral problems and dysphoric mood. The patient is not nervous/anxious.        Objective:   Physical Exam  Constitutional: He is oriented to person, place, and time. He appears well-developed.  HENT:  Head: Normocephalic.  Right Ear: External ear normal.  Left Ear: External ear normal.  Eyes: Conjunctivae and EOM are normal.  Neck: Normal range of motion.  Cardiovascular: Normal rate and normal heart sounds.   Pulmonary/Chest: Breath sounds normal.  Abdominal: Bowel sounds are normal.  Musculoskeletal: Normal range of motion. He exhibits no edema or tenderness.  Neurological: He is alert and oriented to person, place, and time.  Psychiatric: He has a normal mood and affect. His behavior is normal.          Assessment & Plan:   Diabetes mellitus.  Will check hemoglobin A1c Essential hypertension, well-controlled Dyslipidemia.  Will check lipid profile and resume pravastatin Status post laminectomy  Follow-up 4 months  Rogelia Boga, MD

## 2016-02-23 NOTE — Patient Instructions (Signed)
Limit your sodium (Salt) intake   Please check your hemoglobin A1c every 3-6  months    It is important that you exercise regularly, at least 20 minutes 3 to 4 times per week.  If you develop chest pain or shortness of breath seek  medical attention.  Return in 4 months for follow-up

## 2016-04-19 ENCOUNTER — Other Ambulatory Visit: Payer: Self-pay

## 2016-04-19 MED ORDER — CANAGLIFLOZIN 300 MG PO TABS: ORAL_TABLET | ORAL | 5 refills | Status: DC

## 2016-07-10 ENCOUNTER — Ambulatory Visit (INDEPENDENT_AMBULATORY_CARE_PROVIDER_SITE_OTHER): Payer: 59 | Admitting: Internal Medicine

## 2016-07-10 ENCOUNTER — Other Ambulatory Visit: Payer: Self-pay | Admitting: Internal Medicine

## 2016-07-10 ENCOUNTER — Encounter: Payer: Self-pay | Admitting: Internal Medicine

## 2016-07-10 VITALS — BP 128/80 | HR 75 | Temp 98.6°F | Ht 72.0 in | Wt 252.8 lb

## 2016-07-10 DIAGNOSIS — E119 Type 2 diabetes mellitus without complications: Secondary | ICD-10-CM

## 2016-07-10 DIAGNOSIS — E785 Hyperlipidemia, unspecified: Secondary | ICD-10-CM | POA: Diagnosis not present

## 2016-07-10 DIAGNOSIS — I10 Essential (primary) hypertension: Secondary | ICD-10-CM | POA: Diagnosis not present

## 2016-07-10 LAB — HEMOGLOBIN A1C: HEMOGLOBIN A1C: 6.4 % (ref 4.6–6.5)

## 2016-07-10 MED ORDER — ESCITALOPRAM OXALATE 10 MG PO TABS: 10.0000 mg | ORAL_TABLET | Freq: Every day | ORAL | 1 refills | Status: DC

## 2016-07-10 NOTE — Progress Notes (Signed)
Subjective:    Patient ID: Reginald Kirby., male    DOB: 03-Jul-1975, 41 y.o.   MRN: [id number]  HPI  41 year old patient who is seen today in follow-up.  He has a history of type 2 diabetes.  He has essential hypertension and dyslipidemia.  He has resume statin therapy. His main complaint is increasing anxiety.  He states that he has had this since his teenage years.  He has been on Prozac and Xanax in the past.  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 Last hemoglobin A1c was up a bit No new concerns or complaints  He states that his anxiety is also interfering with sleep.  At times he feels mildly depressed  Past Medical History:  Diagnosis Date  . Anxiety   . Arthritis    DDD  BACK  . Depression   . Diabetes mellitus   . GERD (gastroesophageal reflux disease)   . Headache    HX MIGRAINES   . Hyperlipidemia   . Hypertension   . Sinusitis      Social History   Social History  . Marital status: Married    Spouse name: N/A  . Number of children: 3  . Years of education: N/A   Occupational History  . RN-- [employer]    Social History Main Topics  . Smoking status: Never Smoker  . Smokeless tobacco: Never Used  . Alcohol use Yes     Comment: occ.   . Drug use: No  . Sexual activity: Not on file   Other Topics Concern  . Not on file   Social History Narrative  . No narrative on file    Past Surgical History:  Procedure Laterality Date  . CARDIAC CATHETERIZATION     2006    OKAY   . epidural injections   2002  . LUMBAR LAMINECTOMY/DECOMPRESSION MICRODISCECTOMY N/A 10/13/2015   Procedure: L3-4 L4-5 L5-S1 Laminectomy;  Surgeon: Barnett AbuHenry Elsner, MD;  Location: [facility];  Service: Neurosurgery;  Laterality: N/A;  L3-4 L4-5 L5-S1 Laminectomy    No family history on file.  Allergies  Allergen Reactions  . Demerol Itching    Current Outpatient Prescriptions on File Prior to Visit  Medication Sig Dispense  Refill  . amLODipine (NORVASC) 5 MG tablet Take 1 tablet (5 mg total) by mouth daily. 90 tablet 3  . Blood Glucose Calibration (ACCU-CHEK INSTANT CONTROL) LIQD 1 each by In Vitro route 2 (two) times a week. 1 each 0  . canagliflozin (INVOKANA) 300 MG TABS tablet TAKE 1 TABLET BY MOUTH DAILY BEFORE BREAKFAST. 30 tablet 5  . DEXILANT 60 MG capsule TAKE 1 CAPSULE BY MOUTH DAILY. 90 capsule 2  . gabapentin (NEURONTIN) 300 MG capsule TAKE 1 CAPSULE BY MOUTH 3 TIMES DAILY. 90 capsule 3  . glipiZIDE (GLUCOTROL XL) 5 MG 24 hr tablet Take 1 tablet (5 mg total) by mouth daily. 90 tablet 1  . glucose blood (FREESTYLE LITE) test strip 1 each by Other route 3 (three) times daily. Use as instructed 300 each 3  . losartan-hydrochlorothiazide (HYZAAR) 100-25 MG tablet TAKE 1 TABLET BY MOUTH DAILY. 90 tablet 1  . metFORMIN (GLUCOPHAGE) 1000 MG tablet TAKE 1 TABLET BY MOUTH DAILY WITH A MEAL 180 tablet 3  . methocarbamol (ROBAXIN) 500 MG tablet   5  . pioglitazone (ACTOS) 30 MG tablet TAKE 1 TABLET BY MOUTH DAILY. 90 tablet 1  . traMADol (  ULTRAM) 50 MG tablet TAKE 1 TABLET BY MOUTH EVERY 6 HOURS AS NEEDED 90 tablet 2   No current facility-administered medications on file prior to visit.     BP 128/80 (BP Location: Right Arm, Patient Position: Sitting, Cuff Size: Normal)   Pulse 75   Temp 98.6 F (37 C) (Oral)   Ht 6' (1.829 m)   Wt 252 lb 12.8 oz (114.7 kg)   SpO2 90%   BMI 34.29 kg/m     Review of Systems  Constitutional: Negative for appetite change, chills, fatigue and fever.  HENT: Negative for congestion, dental problem, ear pain, hearing loss, sore throat, tinnitus, trouble swallowing and voice change.   Eyes: Negative for pain, discharge and visual disturbance.  Respiratory: Negative for cough, chest tightness, wheezing and stridor.   Cardiovascular: Negative for chest pain, palpitations and leg swelling.  Gastrointestinal: Negative for abdominal distention, abdominal pain, blood in stool,  constipation, diarrhea, nausea and vomiting.  Genitourinary: Negative for difficulty urinating, discharge, flank pain, genital sores, hematuria and urgency.  Musculoskeletal: Negative for arthralgias, back pain, gait problem, joint swelling, myalgias and neck stiffness.  Skin: Negative for rash.  Neurological: Negative for dizziness, syncope, speech difficulty, weakness, numbness and headaches.  Hematological: Negative for adenopathy. Does not bruise/bleed easily.  Psychiatric/Behavioral: Positive for dysphoric mood and sleep disturbance. Negative for behavioral problems. The patient is nervous/anxious.        Objective:   Physical Exam  Constitutional: He is oriented to person, place, and time. He appears well-developed.  HENT:  Head: Normocephalic.  Right Ear: External ear normal.  Left Ear: External ear normal.  Eyes: Conjunctivae and EOM are normal.  Neck: Normal range of motion.  Cardiovascular: Normal rate and normal heart sounds.   Pulmonary/Chest: Breath sounds normal.  Abdominal: Bowel sounds are normal.  Musculoskeletal: Normal range of motion. He exhibits no edema or tenderness.  Neurological: He is alert and oriented to person, place, and time.  Psychiatric: He has a normal mood and affect. His behavior is normal.          Assessment & Plan:   Diabetes mellitus.  Will check a hemoglobin A1c Essential hypertension, stable Dyslipidemia.  Continue statin therapy Adjustment disorder with mixed anxiety and depressed mood.  We'll start on Lexapro.  Refer behavioral health.  Follow-up 3 months  Rogelia Boga

## 2016-07-10 NOTE — Patient Instructions (Signed)
Behavioral health referral as discussed    It is important that you exercise regularly, at least 20 minutes 3 to 4 times per week.  If you develop chest pain or shortness of breath seek  medical attention.  Limit your sodium (Salt) intake   Please check your hemoglobin A1c every 3 months

## 2016-07-10 NOTE — Progress Notes (Signed)
Pre visit review using our clinic review tool, if applicable. No additional management support is needed unless otherwise documented below in the visit note. 

## 2016-07-15 ENCOUNTER — Encounter: Payer: Self-pay | Admitting: Internal Medicine

## 2016-07-18 ENCOUNTER — Encounter: Payer: Self-pay | Admitting: Internal Medicine

## 2016-07-19 ENCOUNTER — Other Ambulatory Visit: Payer: Self-pay | Admitting: Emergency Medicine

## 2016-07-19 MED ORDER — ALPRAZOLAM 0.25 MG PO TABS: 0.2500 mg | ORAL_TABLET | Freq: Every evening | ORAL | 0 refills | Status: DC | PRN

## 2016-07-19 MED ORDER — ALPRAZOLAM 1 MG PO TABS: 1.0000 mg | ORAL_TABLET | Freq: Every evening | ORAL | 0 refills | Status: DC | PRN

## 2016-07-22 ENCOUNTER — Other Ambulatory Visit: Payer: Self-pay | Admitting: Internal Medicine

## 2016-08-12 ENCOUNTER — Other Ambulatory Visit: Payer: Self-pay | Admitting: Internal Medicine

## 2016-08-13 ENCOUNTER — Telehealth: Payer: Self-pay

## 2016-08-13 MED ORDER — ALPRAZOLAM 1 MG PO TABS: 1.0000 mg | ORAL_TABLET | Freq: Every evening | ORAL | 0 refills | Status: DC | PRN

## 2016-08-13 NOTE — Telephone Encounter (Signed)
[facility] Dexilant [facility] 60 mg [facility]. PA submitted & is pending. Key: [id number]

## 2016-08-19 NOTE — Telephone Encounter (Signed)
PA approved, form faxed back to pharmacy. 

## 2016-09-20 ENCOUNTER — Other Ambulatory Visit: Payer: Self-pay | Admitting: Internal Medicine

## 2016-09-23 ENCOUNTER — Other Ambulatory Visit: Payer: Self-pay | Admitting: Internal Medicine

## 2016-09-23 MED ORDER — ALPRAZOLAM 1 MG PO TABS: 1.0000 mg | ORAL_TABLET | Freq: Every evening | ORAL | 0 refills | Status: DC | PRN

## 2016-10-02 ENCOUNTER — Encounter: Payer: Self-pay | Admitting: Internal Medicine

## 2016-10-07 ENCOUNTER — Other Ambulatory Visit: Payer: Self-pay | Admitting: Internal Medicine

## 2016-10-07 ENCOUNTER — Encounter: Payer: Self-pay | Admitting: Internal Medicine

## 2016-10-07 MED ORDER — BUPROPION HCL ER (XL) 150 MG PO TB24: 150.0000 mg | ORAL_TABLET | Freq: Every day | ORAL | 3 refills | Status: DC

## 2016-10-08 ENCOUNTER — Other Ambulatory Visit: Payer: Self-pay | Admitting: Internal Medicine

## 2016-10-08 MED ORDER — OMEPRAZOLE 40 MG PO CPDR: 40.0000 mg | DELAYED_RELEASE_CAPSULE | Freq: Every day | ORAL | 3 refills | Status: DC

## 2016-10-14 ENCOUNTER — Other Ambulatory Visit: Payer: Self-pay | Admitting: Internal Medicine

## 2016-10-27 ENCOUNTER — Encounter: Payer: Self-pay | Admitting: Internal Medicine

## 2016-10-28 ENCOUNTER — Encounter: Payer: Self-pay | Admitting: Internal Medicine

## 2016-10-30 ENCOUNTER — Other Ambulatory Visit: Payer: Self-pay | Admitting: Internal Medicine

## 2016-10-30 MED ORDER — BUPROPION HCL ER (XL) 150 MG PO TB24: 150.0000 mg | ORAL_TABLET | Freq: Every day | ORAL | 1 refills | Status: DC

## 2016-10-30 MED ORDER — OMEPRAZOLE 40 MG PO CPDR: 40.0000 mg | DELAYED_RELEASE_CAPSULE | Freq: Every day | ORAL | 1 refills | Status: DC

## 2016-11-29 ENCOUNTER — Other Ambulatory Visit: Payer: Self-pay | Admitting: Internal Medicine

## 2016-12-04 ENCOUNTER — Encounter: Payer: Self-pay | Admitting: Internal Medicine

## 2016-12-09 ENCOUNTER — Other Ambulatory Visit: Payer: Self-pay | Admitting: Internal Medicine

## 2016-12-09 MED ORDER — ALPRAZOLAM 1 MG PO TABS: 1.0000 mg | ORAL_TABLET | Freq: Every evening | ORAL | 0 refills | Status: DC | PRN

## 2016-12-10 ENCOUNTER — Other Ambulatory Visit: Payer: Self-pay | Admitting: Internal Medicine

## 2017-01-22 ENCOUNTER — Encounter: Payer: Self-pay | Admitting: Internal Medicine

## 2017-01-22 ENCOUNTER — Ambulatory Visit (INDEPENDENT_AMBULATORY_CARE_PROVIDER_SITE_OTHER): Payer: 59 | Admitting: Internal Medicine

## 2017-01-22 VITALS — BP 138/90 | HR 80 | Temp 98.4°F | Wt 257.0 lb

## 2017-01-22 DIAGNOSIS — E119 Type 2 diabetes mellitus without complications: Secondary | ICD-10-CM

## 2017-01-22 DIAGNOSIS — I10 Essential (primary) hypertension: Secondary | ICD-10-CM

## 2017-01-22 LAB — POCT GLYCOSYLATED HEMOGLOBIN (HGB A1C): HEMOGLOBIN A1C: 7

## 2017-01-22 MED ORDER — FLUTICASONE PROPIONATE 50 MCG/ACT NA SUSP: 2.0000 | Freq: Every day | NASAL | 6 refills | Status: DC

## 2017-01-22 MED ORDER — ALPRAZOLAM 1 MG PO TABS: ORAL_TABLET | ORAL | 0 refills | Status: DC

## 2017-01-22 NOTE — Progress Notes (Signed)
Subjective:    Patient ID: Reginald Islam., male    DOB: 09/13/1975, 41 y.o.   MRN: [id number]  HPI  Lab Results  Component Value Date   HGBA1C 6.4 07/10/2016    BP Readings from Last 3 Encounters:  01/22/17 138/90  07/10/16 128/80  02/23/16 64/41   41 year old patient who is seen today for follow-up.  He has type 2 diabetes which has been well-controlled on oral medications Concerns today include sinus congestion, most marked on the right side.  Symptoms have been present for about 3 days he has had some associated sore throat. He has a history of anxiety, depression, which presently is well controlled.  He has done well on Wellbutrin.  He does have some occasional insomnia treated with the alprazolam. No other concerns or complaints. He has hypertension and remains on statin therapy.  Wt Readings from Last 3 Encounters:  01/22/17 257 lb (116.6 kg)  07/10/16 252 lb 12.8 oz (114.7 kg)  02/23/16 251 lb (113.9 kg)    Past Medical History:  Diagnosis Date  . Anxiety   . Arthritis    DDD  BACK  . Depression   . Diabetes mellitus   . GERD (gastroesophageal reflux disease)   . Headache    HX MIGRAINES   . Hyperlipidemia   . Hypertension   . Sinusitis      Social History   Social History  . Marital status: Married    Spouse name: N/A  . Number of children: 3  . Years of education: N/A   Occupational History  . RN-- [employer]    Social History Main Topics  . Smoking status: Never Smoker  . Smokeless tobacco: Never Used  . Alcohol use Yes     Comment: occ.   . Drug use: No  . Sexual activity: Not on file   Other Topics Concern  . Not on file   Social History Narrative  . No narrative on file    Past Surgical History:  Procedure Laterality Date  . CARDIAC CATHETERIZATION     2006    OKAY   . epidural injections   2002  . LUMBAR LAMINECTOMY/DECOMPRESSION MICRODISCECTOMY N/A 10/13/2015   Procedure: L3-4 L4-5 L5-S1 Laminectomy;  Surgeon: Barnett Abu, MD;  Location: [facility];  Service: Neurosurgery;  Laterality: N/A;  L3-4 L4-5 L5-S1 Laminectomy    No family history on file.  Allergies  Allergen Reactions  . Demerol Itching    Current Outpatient Prescriptions on File Prior to Visit  Medication Sig Dispense Refill  . ALPRAZolam (XANAX) 1 MG tablet TAKE 1 TABLET BY MOUTH ONCE DAILY AT BEDTIME AS NEEDED FOR ANXIETY 30 tablet 0  . amLODipine (NORVASC) 5 MG tablet TAKE 1 TABLET (5 MG TOTAL) BY MOUTH DAILY. 90 tablet 3  . Blood Glucose Calibration (ACCU-CHEK INSTANT CONTROL) LIQD 1 each by In Vitro route 2 (two) times a week. 1 each 0  . buPROPion (WELLBUTRIN XL) 150 MG 24 hr tablet Take 1 tablet (150 mg total) by mouth daily. 90 tablet 1  . gabapentin (NEURONTIN) 300 MG capsule TAKE 1 CAPSULE BY MOUTH 3 TIMES DAILY. 90 capsule 3  . glipiZIDE (GLUCOTROL XL) 5 MG 24 hr tablet TAKE 1 TABLET BY MOUTH ONCE DAILY 90 tablet 1  . glucose blood (FREESTYLE LITE) test strip 1 each by Other route 3 (three) times daily. Use as instructed 300 each 3  . INVOKANA 300 MG TABS tablet TAKE 1 TABLET BY  MOUTH DAILY BEFORE BREAKFAST. 30 tablet 5  . losartan-hydrochlorothiazide (HYZAAR) 100-25 MG tablet TAKE 1 TABLET BY MOUTH DAILY. 90 tablet 1  . metFORMIN (GLUCOPHAGE) 1000 MG tablet TAKE 1 TABLET BY MOUTH ONCE DAILY WITH A MEAL 180 tablet 3  . methocarbamol (ROBAXIN) 500 MG tablet   5  . omeprazole (PRILOSEC) 40 MG capsule Take 1 capsule (40 mg total) by mouth daily. 90 capsule 1  . pioglitazone (ACTOS) 30 MG tablet TAKE 1 TABLET BY MOUTH DAILY. 90 tablet 1  . pravastatin (PRAVACHOL) 40 MG tablet TAKE 1 TABLET BY MOUTH ONCE DAILY 90 tablet 3  . traMADol (ULTRAM) 50 MG tablet TAKE 1 TABLET BY MOUTH EVERY 6 HOURS AS NEEDED 90 tablet 2   No current facility-administered medications on file prior to visit.     BP 138/90 (BP Location: Left Arm, Patient Position: Sitting, Cuff Size: Normal)   Pulse 80   Temp 98.4 F (36.9 C) (Oral)   Wt 257 lb  (116.6 kg)   SpO2 97%   BMI 34.86 kg/m    Review of Systems  Constitutional: Negative for appetite change, chills, fatigue and fever.  HENT: Positive for congestion, sinus pressure and sore throat. Negative for dental problem, ear pain, hearing loss, sinus pain, tinnitus, trouble swallowing and voice change.   Eyes: Negative for pain, discharge and visual disturbance.  Respiratory: Negative for cough, chest tightness, wheezing and stridor.   Cardiovascular: Negative for chest pain, palpitations and leg swelling.  Gastrointestinal: Negative for abdominal distention, abdominal pain, blood in stool, constipation, diarrhea, nausea and vomiting.  Genitourinary: Negative for difficulty urinating, discharge, flank pain, genital sores, hematuria and urgency.  Musculoskeletal: Negative for arthralgias, back pain, gait problem, joint swelling, myalgias and neck stiffness.  Skin: Negative for rash.  Neurological: Negative for dizziness, syncope, speech difficulty, weakness, numbness and headaches.  Hematological: Negative for adenopathy. Does not bruise/bleed easily.  Psychiatric/Behavioral: Positive for sleep disturbance. Negative for behavioral problems and dysphoric mood. The patient is not nervous/anxious.        Objective:   Physical Exam  Constitutional: He is oriented to person, place, and time. He appears well-developed.  Blood pressure 132/84  HENT:  Head: Normocephalic.  Right Ear: External ear normal.  Left Ear: External ear normal.  Eyes: Conjunctivae and EOM are normal.  Neck: Normal range of motion.  Cardiovascular: Normal rate and normal heart sounds.   Pulmonary/Chest: Breath sounds normal.  Abdominal: Bowel sounds are normal.  Musculoskeletal: Normal range of motion. He exhibits no edema or tenderness.  Neurological: He is alert and oriented to person, place, and time.  Psychiatric: He has a normal mood and affect. His behavior is normal.          Assessment & Plan:     URI.  Will add fluticasone nasal spray Diabetes/weight gain.  Hemoglobin A1c up to 7.0.  Weight loss encouraged Essential hypertension, stable Obesity Dyslipidemia.  Continue statin therapy  Follow-up 4 months  Reginald Kirby

## 2017-01-22 NOTE — Patient Instructions (Addendum)
Limit your sodium (Salt) intake  Please check your blood pressure on a regular basis.  If it is consistently greater than 150/90, please make an office appointment.   Please check your hemoglobin A1c every 3-6  Months  You need to lose weight.  Consider a lower calorie diet and regular exercise.  Hydrate and Humidify  Drink enough water to keep your urine clear or pale yellow. Staying hydrated will help to thin your mucus.  Use a cool mist humidifier to keep the humidity level in your home above 50%.  Inhale steam for 10-15 minutes, 3-4 times a day or as told by your health care provider. You can do this in the bathroom while a hot shower is running.  Limit your exposure to cool or dry air.  Fluticasone nasal spray as prescribed

## 2017-01-24 ENCOUNTER — Encounter: Payer: Self-pay | Admitting: Internal Medicine

## 2017-02-03 ENCOUNTER — Other Ambulatory Visit: Payer: Self-pay | Admitting: Internal Medicine

## 2017-03-06 ENCOUNTER — Encounter: Payer: Self-pay | Admitting: Internal Medicine

## 2017-03-24 ENCOUNTER — Other Ambulatory Visit: Payer: Self-pay | Admitting: Internal Medicine

## 2017-03-24 ENCOUNTER — Encounter: Payer: Self-pay | Admitting: Internal Medicine

## 2017-03-24 DIAGNOSIS — Z800 Family history of malignant neoplasm of digestive organs: Secondary | ICD-10-CM

## 2017-03-25 ENCOUNTER — Encounter: Payer: Self-pay | Admitting: Internal Medicine

## 2017-05-07 ENCOUNTER — Other Ambulatory Visit: Payer: Self-pay | Admitting: Internal Medicine

## 2017-05-19 ENCOUNTER — Encounter: Payer: Self-pay | Admitting: Internal Medicine

## 2017-05-19 ENCOUNTER — Ambulatory Visit: Payer: 59 | Admitting: Internal Medicine

## 2017-05-19 VITALS — BP 120/70 | HR 84 | Ht 72.0 in | Wt 253.0 lb

## 2017-05-19 DIAGNOSIS — Z1211 Encounter for screening for malignant neoplasm of colon: Secondary | ICD-10-CM

## 2017-05-19 DIAGNOSIS — Z800 Family history of malignant neoplasm of digestive organs: Secondary | ICD-10-CM | POA: Diagnosis not present

## 2017-05-19 DIAGNOSIS — E119 Type 2 diabetes mellitus without complications: Secondary | ICD-10-CM

## 2017-05-19 MED ORDER — NA SULFATE-K SULFATE-MG SULF 17.5-3.13-1.6 GM/177ML PO SOLN: 1.0000 | Freq: Once | ORAL | 0 refills | Status: AC

## 2017-05-19 NOTE — Progress Notes (Signed)
HISTORY OF PRESENT ILLNESS:  Reginald Islamrnest H Eilts Jr. is a 41 y.o. 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 Patient reports his father being diagnosed with colon cancer at age 41. Died shortly thereafter. Patient has not had colon cancer screening. His GI review of systems is remarkable for indigestion heartburn which is managed nicely with omeprazole. No lower GI complaints. He has several siblings who have been screened for colon cancer. He is diabetic and takes oral agents.  REVIEW OF SYSTEMS:  All non-GI ROS negative unless otherwise stated in the history of present illness except for arthritis, back pain, depression  Past Medical History:  Diagnosis Date  . Anxiety   . Arthritis    DDD  BACK  . Depression   . Diabetes mellitus   . GERD (gastroesophageal reflux disease)   . Headache    HX MIGRAINES   . Hyperlipidemia   . Hypertension   . Sinusitis     Past Surgical History:  Procedure Laterality Date  . CARDIAC CATHETERIZATION     2006    OKAY   . epidural injections   2002  . LUMBAR LAMINECTOMY/DECOMPRESSION MICRODISCECTOMY N/A 10/13/2015   Procedure: L3-4 L4-5 L5-S1 Laminectomy;  Surgeon: Barnett AbuHenry Elsner, MD;  Location: [facility];  Service: Neurosurgery;  Laterality: N/A;  L3-4 L4-5 L5-S1 Laminectomy    Social History Reginald Islamrnest H Dawes Jr.  reports that  has never smoked. he has never used smokeless tobacco. He reports that he drinks alcohol. He reports that he does not use drugs.  family history includes Colon cancer in his father; Diabetes in his brother; Heart disease in his mother.  Allergies  Allergen Reactions  . Demerol Itching       PHYSICAL EXAMINATION: Vital signs: BP 120/70   Pulse 84   Ht 6' (1.829 m)   Wt 253 lb (114.8 kg)   BMI 34.31 kg/m   Constitutional: generally well-appearing, no acute distress Psychiatric: alert and oriented x3,  cooperative Eyes: extraocular movements intact, anicteric, conjunctiva pink Mouth: oral pharynx moist, no lesions Neck: supple no lymphadenopathy Cardiovascular: heart regular rate and rhythm, no murmur Lungs: clear to auscultation bilaterally Abdomen: soft, nontender, nondistended, no obvious ascites, no peritoneal signs, normal bowel sounds, no organomegaly Rectal: Deferred until colonoscopy Extremities: no clubbing, cyanosis, or lower extremity edema bilaterally Skin: no lesions on visible extremities Neuro: No focal deficits. Cranial nerves intact  ASSESSMENT:  #1. Family history of colon cancer in first-degree relative at age 41. Appropriate candidate for colon cancer screening without contraindication. #2. Diabetes mellitus   PLAN:  #1. Screening colonoscopy.The nature of the procedure, as well as the risks, benefits, and alternatives were carefully and thoroughly reviewed with the patient. Ample time for discussion and questions allowed. The patient understood, was satisfied, and agreed to proceed. #2. Hold diabetic medications the day of the procedure to avoid and wanted hypoglycemia

## 2017-05-19 NOTE — Patient Instructions (Signed)

## 2017-05-22 ENCOUNTER — Encounter: Payer: Self-pay | Admitting: Internal Medicine

## 2017-05-27 ENCOUNTER — Encounter: Payer: 59 | Admitting: Internal Medicine

## 2017-05-29 ENCOUNTER — Telehealth: Payer: Self-pay

## 2017-05-29 NOTE — Telephone Encounter (Signed)
Reprinted colonoscopy instructions for new procedure date of 06/05/2017 and mailed to patient.

## 2017-06-05 ENCOUNTER — Ambulatory Visit (AMBULATORY_SURGERY_CENTER): Payer: 59 | Admitting: Internal Medicine

## 2017-06-05 ENCOUNTER — Encounter: Payer: Self-pay | Admitting: Internal Medicine

## 2017-06-05 ENCOUNTER — Other Ambulatory Visit: Payer: Self-pay

## 2017-06-05 VITALS — BP 148/95 | HR 80 | Temp 98.6°F | Resp 15 | Ht 72.0 in | Wt 253.0 lb

## 2017-06-05 DIAGNOSIS — Z1211 Encounter for screening for malignant neoplasm of colon: Secondary | ICD-10-CM | POA: Diagnosis not present

## 2017-06-05 DIAGNOSIS — Z1212 Encounter for screening for malignant neoplasm of rectum: Secondary | ICD-10-CM | POA: Diagnosis not present

## 2017-06-05 DIAGNOSIS — Z800 Family history of malignant neoplasm of digestive organs: Secondary | ICD-10-CM | POA: Diagnosis present

## 2017-06-05 MED ORDER — SODIUM CHLORIDE 0.9 % IV SOLN: 500.0000 mL | Freq: Once | INTRAVENOUS | Status: DC

## 2017-06-05 NOTE — Op Note (Signed)
[facility] Patient Name: Robin Searingrnest Newey Procedure Date: 06/05/2017 8:02 AM MRN: [id number] Endoscopist: Wilhemina BonitoJohn N. Marina GoodellPerry , MD Age: [age] Referring MD:  Date of Birth: [birth year] Gender: Male Account #: [id number] Procedure:                Colonoscopy Indications:              Screening in patient at increased risk: Colorectal                            cancer in father at age 41 Medicines:                Monitored Anesthesia Care Procedure:                Pre-Anesthesia Assessment:                           - Prior to the procedure, a History and Physical                            was performed, and patient medications and                            allergies were reviewed. The patient's tolerance of                            previous anesthesia was also reviewed. The risks                            and benefits of the procedure and the sedation                            options and risks were discussed with the patient.                            All questions were answered, and informed consent                            was obtained. Prior Anticoagulants: The patient has                            taken no previous anticoagulant or antiplatelet                            agents. ASA Grade Assessment: III - A patient with                            severe systemic disease. After reviewing the risks                            and benefits, the patient was deemed in                            satisfactory condition to undergo the procedure.  After obtaining informed consent, the colonoscope                            was passed under direct vision. Throughout the                            procedure, the patient's blood pressure, pulse, and                            oxygen saturations were monitored continuously. The                            Model CF-HQ190L [id number]) scope was introduced                            through the anus and advanced to  the the cecum,                            identified by appendiceal orifice and ileocecal                            valve. The ileocecal valve, appendiceal orifice,                            and rectum were photographed. The quality of the                            bowel preparation was excellent. The colonoscopy                            was performed without difficulty. The patient                            tolerated the procedure well. The bowel preparation                            used was SUPREP. Scope In: 8:33:27 AM Scope Out: 8:47:13 AM Scope Withdrawal Time: 0 hours 11 minutes 40 seconds  Total Procedure Duration: 0 hours 13 minutes 46 seconds  Findings:                 Multiple small and large-mouthed diverticula were                            found in the left colon and right colon.                           Internal hemorrhoids were found during retroflexion.                           The exam was otherwise without abnormality on                            direct and retroflexion views. Complications:  No immediate complications. Estimated blood loss:                            None. Estimated Blood Loss:     Estimated blood loss: none. Impression:               - Diverticulosis in the left colon and in the right                            colon.                           - Internal hemorrhoids.                           - The examination was otherwise normal on direct                            and retroflexion views.                           - No specimens collected. Recommendation:           - Repeat colonoscopy in 5 years for screening                            purposes.                           - Patient has a contact number available for                            emergencies. The signs and symptoms of potential                            delayed complications were discussed with the                            patient. Return to normal activities  tomorrow.                            Written discharge instructions were provided to the                            patient.                           - Resume previous diet.                           - Continue present medications. Wilhemina Bonito. Marina Goodell, MD 06/05/2017 9:00:15 AM This report has been signed electronically.

## 2017-06-05 NOTE — Progress Notes (Signed)
A and O x3. Report to RN. Tolerated MAC anesthesia well.

## 2017-06-05 NOTE — Progress Notes (Signed)
No problems noted in the recovery room. maw 

## 2017-06-05 NOTE — Patient Instructions (Signed)
YOU HAD AN ENDOSCOPIC PROCEDURE TODAY AT THE [facility]:   Refer to the procedure report that was given to you for any specific questions about what was found during the examination.  If the procedure report does not answer your questions, please call your gastroenterologist to clarify.  If you requested that your care partner not be given the details of your procedure findings, then the procedure report has been included in a sealed envelope for you to review at your convenience later.  YOU SHOULD EXPECT: Some feelings of bloating in the abdomen. Passage of more gas than usual.  Walking can help get rid of the air that was put into your GI tract during the procedure and reduce the bloating. If you had a lower endoscopy (such as a colonoscopy or flexible sigmoidoscopy) you may notice spotting of blood in your stool or on the toilet paper. If you underwent a bowel prep for your procedure, you may not have a normal bowel movement for a few days.  Please Note:  You might notice some irritation and congestion in your nose or some drainage.  This is from the oxygen used during your procedure.  There is no need for concern and it should clear up in a day or so.  SYMPTOMS TO REPORT IMMEDIATELY:   Following lower endoscopy (colonoscopy or flexible sigmoidoscopy):  Excessive amounts of blood in the stool  Significant tenderness or worsening of abdominal pains  Swelling of the abdomen that is new, acute  Fever of 100F or higher    For urgent or emergent issues, a gastroenterologist can be reached at any hour by calling [phone].   DIET:  We do recommend a small meal at first, but then you may proceed to your regular diet.  Drink plenty of fluids but you should avoid alcoholic beverages for 24 hours.  ACTIVITY:  You should plan to take it easy for the rest of today and you should NOT DRIVE or use heavy machinery until tomorrow (because of the sedation medicines used during the test).     FOLLOW UP: Our staff will call the number listed on your records the next business day following your procedure to check on you and address any questions or concerns that you may have regarding the information given to you following your procedure. If we do not reach you, we will leave a message.  However, if you are feeling well and you are not experiencing any problems, there is no need to return our call.  We will assume that you have returned to your regular daily activities without incident.  If any biopsies were taken you will be contacted by phone or by letter within the next 1-3 weeks.  Please call us at [phone] if you have not heard about the biopsies in 3 weeks.    SIGNATURES/CONFIDENTIALITY: You and/or your care partner have signed paperwork which will be entered into your electronic medical record.  These signatures attest to the fact that that the information above on your After Visit Summary has been reviewed and is understood.  Full responsibility of the confidentiality of this discharge information lies with you and/or your care-partner.    Handouts were given to your care partner on  Diverticulosis, hemorrhoids, and a high fiber diet with liberal fluid intake. Your blood sugar was 173 in the recovery room. You may resume your current medications today. Next screening colonoscopy in 5 years. Please call if any questions or concerns.

## 2017-06-05 NOTE — Progress Notes (Signed)
Pt's states no medical or surgical changes since previsit or office visit. 

## 2017-06-06 ENCOUNTER — Telehealth: Payer: Self-pay

## 2017-06-06 NOTE — Telephone Encounter (Signed)
  Follow up Call-  Call back number 06/05/2017  Post procedure Call Back phone  # [phone]  Permission to leave phone message Yes  Some recent data might be hidden     Patient questions:  Do you have a fever, pain , or abdominal swelling? No. Pain Score  0 *  Have you tolerated food without any problems? Yes.    Have you been able to return to your normal activities? Yes.    Do you have any questions about your discharge instructions: Diet   No. Medications  No. Follow up visit  No.  Do you have questions or concerns about your Care? No.  Actions: * If pain score is 4 or above: No action needed, pain <4.

## 2017-07-05 ENCOUNTER — Other Ambulatory Visit: Payer: Self-pay | Admitting: Internal Medicine

## 2017-07-07 MED ORDER — ALPRAZOLAM 1 MG PO TABS: ORAL_TABLET | ORAL | 0 refills | Status: DC

## 2017-07-28 ENCOUNTER — Telehealth: Payer: 59 | Admitting: Family

## 2017-07-28 DIAGNOSIS — R6889 Other general symptoms and signs: Secondary | ICD-10-CM

## 2017-07-28 MED ORDER — OSELTAMIVIR PHOSPHATE 75 MG PO CAPS: 75.0000 mg | ORAL_CAPSULE | Freq: Two times a day (BID) | ORAL | 0 refills | Status: DC

## 2017-07-28 NOTE — Progress Notes (Signed)

## 2017-07-30 ENCOUNTER — Encounter: Payer: Self-pay | Admitting: Internal Medicine

## 2017-07-30 NOTE — Telephone Encounter (Signed)
Spoke with patient regarding work excuse and verbalized a schedule needed to be made. Pt was scheduled for 07/31/2017

## 2017-07-31 ENCOUNTER — Encounter: Payer: Self-pay | Admitting: Internal Medicine

## 2017-07-31 ENCOUNTER — Ambulatory Visit (INDEPENDENT_AMBULATORY_CARE_PROVIDER_SITE_OTHER): Payer: No Typology Code available for payment source | Admitting: Internal Medicine

## 2017-07-31 VITALS — BP 120/86 | HR 103 | Temp 99.3°F | Wt 248.6 lb

## 2017-07-31 DIAGNOSIS — J111 Influenza due to unidentified influenza virus with other respiratory manifestations: Secondary | ICD-10-CM

## 2017-07-31 DIAGNOSIS — E119 Type 2 diabetes mellitus without complications: Secondary | ICD-10-CM | POA: Diagnosis not present

## 2017-07-31 LAB — POCT GLYCOSYLATED HEMOGLOBIN (HGB A1C): Hemoglobin A1C: 7.1

## 2017-07-31 NOTE — Patient Instructions (Addendum)

## 2017-07-31 NOTE — Progress Notes (Signed)
   Subjective:    Patient ID: Reginald IslamErnest H Balding Jr., male    DOB: 23-Jun-1975, 42 y.o.   MRN: [id number]  HPI  42 year old patient who had a E visit earlier in the week and has been on Tamiflu for 3 days for a flu syndrome.  His entire household has been ill.  He has improved but still having headaches low-grade fever and myalgias. He has type 2 diabetes.  Lab Results  Component Value Date   HGBA1C 7.0 01/22/2017      Review of Systems  Constitutional: Positive for activity change, appetite change, fatigue and fever. Negative for chills.  HENT: Negative for congestion, dental problem, ear pain, hearing loss, sore throat, tinnitus, trouble swallowing and voice change.   Eyes: Negative for pain, discharge and visual disturbance.  Respiratory: Negative for cough, chest tightness, wheezing and stridor.   Cardiovascular: Negative for chest pain, palpitations and leg swelling.  Gastrointestinal: Negative for abdominal distention, abdominal pain, blood in stool, constipation, diarrhea, nausea and vomiting.  Genitourinary: Negative for difficulty urinating, discharge, flank pain, genital sores, hematuria and urgency.  Musculoskeletal: Positive for myalgias. Negative for arthralgias, back pain, gait problem, joint swelling and neck stiffness.  Skin: Negative for rash.  Neurological: Positive for headaches. Negative for dizziness, syncope, speech difficulty, weakness and numbness.  Hematological: Negative for adenopathy. Does not bruise/bleed easily.  Psychiatric/Behavioral: Negative for behavioral problems and dysphoric mood. The patient is not nervous/anxious.        Objective:   Physical Exam  Constitutional: He is oriented to person, place, and time. He appears well-developed.  HENT:  Head: Normocephalic.  Right Ear: External ear normal.  Left Ear: External ear normal.  Eyes: Conjunctivae and EOM are normal.  Neck: Normal range of motion.  Cardiovascular: Normal rate and normal heart  sounds.  Pulmonary/Chest: Effort normal and breath sounds normal. No respiratory distress. He has no wheezes. He has no rales.  Abdominal: Bowel sounds are normal.  Musculoskeletal: Normal range of motion. He exhibits no edema or tenderness.  Neurological: He is alert and oriented to person, place, and time.  Psychiatric: He has a normal mood and affect. His behavior is normal.          Assessment & Plan:  Flu syndrome. Will complete the final 48 hours of Tamiflu  Continue symptomatic treatment Note written to be out of work until early next week  Diabetes mellitus.  Will review hemoglobin A1c  Rogelia BogaKWIATKOWSKI,Reginald Kirby FRANK

## 2017-08-04 ENCOUNTER — Other Ambulatory Visit: Payer: Self-pay | Admitting: Internal Medicine

## 2017-08-05 NOTE — Telephone Encounter (Signed)
Medication filled to pharmacy as requested.  Glipizide had been removed from med list, but appeared to be in error. Called patient and verified that he is still taking this daily and he is. Reviewed chart and found no indication that this med was supposed to be discontinued.

## 2017-08-15 ENCOUNTER — Encounter: Payer: Self-pay | Admitting: Internal Medicine

## 2017-08-23 ENCOUNTER — Emergency Department (HOSPITAL_COMMUNITY): Payer: No Typology Code available for payment source

## 2017-08-23 ENCOUNTER — Encounter (HOSPITAL_COMMUNITY): Payer: Self-pay | Admitting: Emergency Medicine

## 2017-08-23 ENCOUNTER — Other Ambulatory Visit: Payer: Self-pay

## 2017-08-23 ENCOUNTER — Emergency Department (HOSPITAL_COMMUNITY)
Admission: EM | Admit: 2017-08-23 | Discharge: 2017-08-23 | Disposition: A | Discharge status: Home / Self Care | Payer: No Typology Code available for payment source | Attending: Emergency Medicine | Admitting: Emergency Medicine

## 2017-08-23 DIAGNOSIS — Y92009 Unspecified place in unspecified non-institutional (private) residence as the place of occurrence of the external cause: Secondary | ICD-10-CM | POA: Diagnosis not present

## 2017-08-23 DIAGNOSIS — X500XXA Overexertion from strenuous movement or load, initial encounter: Secondary | ICD-10-CM | POA: Diagnosis not present

## 2017-08-23 DIAGNOSIS — Y939 Activity, unspecified: Secondary | ICD-10-CM | POA: Diagnosis not present

## 2017-08-23 DIAGNOSIS — S99912A Unspecified injury of left ankle, initial encounter: Secondary | ICD-10-CM | POA: Diagnosis present

## 2017-08-23 DIAGNOSIS — Z7984 Long term (current) use of oral hypoglycemic drugs: Secondary | ICD-10-CM | POA: Insufficient documentation

## 2017-08-23 DIAGNOSIS — Z79899 Other long term (current) drug therapy: Secondary | ICD-10-CM | POA: Insufficient documentation

## 2017-08-23 DIAGNOSIS — I10 Essential (primary) hypertension: Secondary | ICD-10-CM | POA: Insufficient documentation

## 2017-08-23 DIAGNOSIS — E119 Type 2 diabetes mellitus without complications: Secondary | ICD-10-CM | POA: Diagnosis not present

## 2017-08-23 DIAGNOSIS — Y999 Unspecified external cause status: Secondary | ICD-10-CM | POA: Diagnosis not present

## 2017-08-23 DIAGNOSIS — S93402A Sprain of unspecified ligament of left ankle, initial encounter: Secondary | ICD-10-CM | POA: Insufficient documentation

## 2017-08-23 MED ORDER — HYDROCODONE-ACETAMINOPHEN 5-325 MG PO TABS: 1.0000 | ORAL_TABLET | ORAL | 0 refills | Status: DC | PRN

## 2017-08-23 MED ORDER — IBUPROFEN 800 MG PO TABS
800.0000 mg | ORAL_TABLET | Freq: Once | ORAL | Status: AC
  Administered 2017-08-23: 800 mg via ORAL
  Filled 2017-08-23: qty 1

## 2017-08-23 MED ORDER — IBUPROFEN 600 MG PO TABS: 600.0000 mg | ORAL_TABLET | Freq: Four times a day (QID) | ORAL | 0 refills | Status: DC | PRN

## 2017-08-23 NOTE — ED Triage Notes (Signed)
Patient is complaining of left ankle pain and foot pain. Patient injured it last night by slipping on some hard wood flooring.

## 2017-08-23 NOTE — ED Notes (Signed)
Crutches given to patient.

## 2017-08-23 NOTE — ED Notes (Signed)
Patient transported to X-ray 

## 2017-08-23 NOTE — ED Provider Notes (Signed)
[facility]-EMERGENCY DEPT Provider Note   CSN: [id number] Arrival date & time: 08/23/17  4098     History   Chief Complaint Chief Complaint  Patient presents with  . Ankle Injury    HPI Nicklos Gaxiola. is a 42 y.o. male.  Pt presents to the ED today with left ankle and foot pain.  Pt said he slipped on his new hardwood floors last night.  He twisted his foot and ankle, took ibuprofen, put ice on it, and went to bed.  He woke up this morning and it was still painful, so he came in.  He is able to put weight on it, but it hurts.  No other injuries.      Past Medical History:  Diagnosis Date  . Anxiety   . Arthritis    DDD  BACK  . Depression   . Diabetes mellitus   . GERD (gastroesophageal reflux disease)   . Headache    HX MIGRAINES   . Hyperlipidemia   . Hypertension   . Sinusitis     Patient Active Problem List   Diagnosis Date Noted  . Lumbar radiculopathy 10/13/2015  . Family history of colon cancer 07/06/2015  . Diabetes mellitus without complication (HCC) 04/03/2015  . Lumbar pain 01/21/2015  . Dyslipidemia 10/23/2011  . Hypertension 10/23/2011    Past Surgical History:  Procedure Laterality Date  . CARDIAC CATHETERIZATION     2006    OKAY   . epidural injections   2002  . LUMBAR LAMINECTOMY/DECOMPRESSION MICRODISCECTOMY N/A 10/13/2015   Procedure: L3-4 L4-5 L5-S1 Laminectomy;  Surgeon: Barnett Abu, MD;  Location: [facility];  Service: Neurosurgery;  Laterality: N/A;  L3-4 L4-5 L5-S1 Laminectomy       Home Medications    Prior to Admission medications   Medication Sig Start Date End Date Taking? Authorizing Provider  ALPRAZolam Prudy Feeler) 1 MG tablet TAKE 1 TABLET BY MOUTH ONCE DAILY AT BEDTIME AS NEEDED FOR ANXIETY 07/07/17   Gordy Savers, MD  amLODipine (NORVASC) 5 MG tablet TAKE 1 TABLET (5 MG TOTAL) BY MOUTH DAILY. 10/07/16   Gordy Savers, MD  Blood Glucose Calibration (ACCU-CHEK INSTANT CONTROL) LIQD 1  each by In Vitro route 2 (two) times a week. 01/21/12   Gordy Savers, MD  buPROPion (WELLBUTRIN XL) 150 MG 24 hr tablet TAKE 1 TABLET (150 MG TOTAL) BY MOUTH DAILY. 05/12/17   Gordy Savers, MD  GLIPIZIDE XL 5 MG 24 hr tablet TAKE 1 TABLET BY MOUTH ONCE DAILY 08/05/17   Gordy Savers, MD  glucose blood (FREESTYLE LITE) test strip 1 each by Other route 3 (three) times daily. Use as instructed 10/28/14   Gordy Savers, MD  HYDROcodone-acetaminophen (NORCO/VICODIN) 5-325 MG tablet Take 1 tablet by mouth every 4 (four) hours as needed. 08/23/17   Jacalyn Lefevre, MD  ibuprofen (ADVIL,MOTRIN) 600 MG tablet Take 1 tablet (600 mg total) by mouth every 6 (six) hours as needed. 08/23/17   Jacalyn Lefevre, MD  INVOKANA 300 MG TABS tablet TAKE 1 TABLET BY MOUTH DAILY BEFORE BREAKFAST. 05/12/17   Gordy Savers, MD  losartan-hydrochlorothiazide (HYZAAR) 100-25 MG tablet TAKE 1 TABLET BY MOUTH DAILY. 08/05/17   Gordy Savers, MD  metFORMIN (GLUCOPHAGE) 1000 MG tablet TAKE 1 TABLET BY MOUTH ONCE DAILY WITH A MEAL 08/05/17   Gordy Savers, MD  omeprazole (PRILOSEC) 40 MG capsule TAKE 1 CAPSULE (40 MG TOTAL) BY MOUTH DAILY. 05/12/17  Gordy SaversKwiatkowski, Peter F, MD  oseltamivir (TAMIFLU) 75 MG capsule Take 1 capsule (75 mg total) by mouth 2 (two) times daily. 07/28/17   Junie SpencerHawks, Christy A, FNP  pravastatin (PRAVACHOL) 40 MG tablet TAKE 1 TABLET BY MOUTH ONCE DAILY 10/14/16   Gordy SaversKwiatkowski, Peter F, MD    Family History Family History  Problem Relation Age of Onset  . Colon cancer Father   . Heart disease Mother        has pacemaker  . Diabetes Brother     Social History Social History   Tobacco Use  . Smoking status: Never Smoker  . Smokeless tobacco: Never Used  Substance Use Topics  . Alcohol use: Yes    Comment: occ.   . Drug use: No     Allergies   Demerol   Review of Systems Review of Systems  Musculoskeletal:       Left ankle/foot pain  All other systems  reviewed and are negative.    Physical Exam Updated Vital Signs BP (!) 151/83 (BP Location: Left Arm)   Pulse 71   Temp 98.3 F (36.8 C) (Oral)   Resp 18   Ht 6' (1.829 m)   Wt 114.3 kg (252 lb)   SpO2 99%   BMI 34.18 kg/m   Physical Exam  Constitutional: He is oriented to person, place, and time. He appears well-developed and well-nourished.  HENT:  Head: Normocephalic and atraumatic.  Right Ear: External ear normal.  Left Ear: External ear normal.  Nose: Nose normal.  Mouth/Throat: Oropharynx is clear and moist.  Eyes: Conjunctivae and EOM are normal. Pupils are equal, round, and reactive to light.  Neck: Normal range of motion. Neck supple.  Cardiovascular: Normal rate, regular rhythm, normal heart sounds and intact distal pulses.  Pulmonary/Chest: Effort normal and breath sounds normal.  Abdominal: Soft. Bowel sounds are normal.  Musculoskeletal:       Left ankle: Tenderness. Lateral malleolus and head of 5th metatarsal tenderness found.  Neurological: He is alert and oriented to person, place, and time.  Skin: Skin is warm. Capillary refill takes less than 2 seconds.  Psychiatric: He has a normal mood and affect.  Nursing note and vitals reviewed.    ED Treatments / Results  Labs (all labs ordered are listed, but only abnormal results are displayed) Labs Reviewed - No data to display  EKG  EKG Interpretation None       Radiology Dg Ankle Complete Left  Result Date: 08/23/2017 CLINICAL DATA:  Lateral ankle pain after fall last night. EXAM: LEFT FOOT - COMPLETE 3+ VIEW; LEFT ANKLE COMPLETE - 3+ VIEW COMPARISON:  None. FINDINGS: No acute fracture or dislocation. Well corticated ossific densities at the tip of the lateral malleolus are consistent with prior trauma. The talar dome is intact. The ankle mortise is symmetric. No significant tibiotalar joint effusion. Prominent enthesopathy about the mid foot and calcaneus. Soft tissues are unremarkable. IMPRESSION:  No acute osseous abnormality of the left ankle and foot. Electronically Signed   By: Obie DredgeWilliam T Derry M.D.   On: 08/23/2017 08:11   Dg Foot Complete Left  Result Date: 08/23/2017 CLINICAL DATA:  Lateral ankle pain after fall last night. EXAM: LEFT FOOT - COMPLETE 3+ VIEW; LEFT ANKLE COMPLETE - 3+ VIEW COMPARISON:  None. FINDINGS: No acute fracture or dislocation. Well corticated ossific densities at the tip of the lateral malleolus are consistent with prior trauma. The talar dome is intact. The ankle mortise is symmetric. No significant tibiotalar joint effusion.  Prominent enthesopathy about the mid foot and calcaneus. Soft tissues are unremarkable. IMPRESSION: No acute osseous abnormality of the left ankle and foot. Electronically Signed   By: Obie Dredge M.D.   On: 08/23/2017 08:11    Procedures Procedures (including critical care time)  Medications Ordered in ED Medications  ibuprofen (ADVIL,MOTRIN) tablet 800 mg (800 mg Oral Given 08/23/17 0747)     Initial Impression / Assessment and Plan / ED Course  I have reviewed the triage vital signs and the nursing notes.  Pertinent labs & imaging results that were available during my care of the patient were reviewed by me and considered in my medical decision making (see chart for details).     Pt will be placed in an air cast and given crutches.  He knows to f/u ortho and to return if worse.  Final Clinical Impressions(s) / ED Diagnoses   Final diagnoses:  Sprain of left ankle, unspecified ligament, initial encounter    ED Discharge Orders        Ordered    ibuprofen (ADVIL,MOTRIN) 600 MG tablet  Every 6 hours PRN     08/23/17 0828    HYDROcodone-acetaminophen (NORCO/VICODIN) 5-325 MG tablet  Every 4 hours PRN     08/23/17 1610       Jacalyn Lefevre, MD 08/23/17 [id number]

## 2017-08-26 MED ORDER — NAPROXEN 500 MG PO TABS: 500.0000 mg | ORAL_TABLET | Freq: Two times a day (BID) | ORAL | 0 refills | Status: DC

## 2017-08-26 NOTE — Addendum Note (Signed)
Addended by: Waymon AmatoJOHNSON, Blane Worthington R on: 08/26/2017 10:01 AM   Modules accepted: Orders

## 2017-08-26 NOTE — Telephone Encounter (Signed)
Medication sent to pharmacy sent electronically for arthritis.

## 2017-09-06 ENCOUNTER — Other Ambulatory Visit: Payer: Self-pay | Admitting: Internal Medicine

## 2017-09-08 MED ORDER — ALPRAZOLAM 1 MG PO TABS: ORAL_TABLET | ORAL | 0 refills | Status: DC

## 2017-10-06 ENCOUNTER — Ambulatory Visit (INDEPENDENT_AMBULATORY_CARE_PROVIDER_SITE_OTHER): Payer: No Typology Code available for payment source | Admitting: Internal Medicine

## 2017-10-06 ENCOUNTER — Encounter: Payer: Self-pay | Admitting: Internal Medicine

## 2017-10-06 VITALS — BP 118/78 | HR 91 | Temp 98.9°F | Wt 246.0 lb

## 2017-10-06 DIAGNOSIS — M545 Low back pain, unspecified: Secondary | ICD-10-CM

## 2017-10-06 DIAGNOSIS — E119 Type 2 diabetes mellitus without complications: Secondary | ICD-10-CM | POA: Diagnosis not present

## 2017-10-06 DIAGNOSIS — I10 Essential (primary) hypertension: Secondary | ICD-10-CM | POA: Diagnosis not present

## 2017-10-06 MED ORDER — METHYLPREDNISOLONE ACETATE 80 MG/ML IJ SUSP
80.0000 mg | Freq: Once | INTRAMUSCULAR | Status: AC
  Administered 2017-10-06: 80 mg via INTRAMUSCULAR

## 2017-10-06 MED ORDER — TIZANIDINE HCL 4 MG PO CAPS: 4.0000 mg | ORAL_CAPSULE | Freq: Three times a day (TID) | ORAL | 2 refills | Status: DC

## 2017-10-06 NOTE — Patient Instructions (Addendum)
Cervical Strain and Sprain Rehab Ask your health care provider which exercises are safe for you. Do exercises exactly as told by your health care provider and adjust them as directed. It is normal to feel mild stretching, pulling, tightness, or discomfort as you do these exercises, but you should stop right away if you feel sudden pain or your pain gets worse.Do not begin these exercises until told by your health care provider. Stretching and range of motion exercises These exercises warm up your muscles and joints and improve the movement and flexibility of your neck. These exercises also help to relieve pain, numbness, and tingling. Exercise A: Cervical side bend  1. Using good posture, sit on a stable chair or stand up. 2. Without moving your shoulders, slowly tilt your left / right ear to your shoulder until you feel a stretch in your neck muscles. You should be looking straight ahead. 3. Hold for __________ seconds. 4. Repeat with the other side of your neck. Repeat __________ times. Complete this exercise __________ times a day. Exercise B: Cervical rotation  1. Using good posture, sit on a stable chair or stand up. 2. Slowly turn your head to the side as if you are looking over your left / right shoulder. ? Keep your eyes level with the ground. ? Stop when you feel a stretch along the side and the back of your neck. 3. Hold for __________ seconds. 4. Repeat this by turning to your other side. Repeat __________ times. Complete this exercise __________ times a day. Exercise C: Thoracic extension and pectoral stretch 1. Roll a towel or a small blanket so it is about 4 inches (10 cm) in diameter. 2. Lie down on your back on a firm surface. 3. Put the towel lengthwise, under your spine in the middle of your back. It should not be not under your shoulder blades. The towel should line up with your spine from your middle back to your lower back. 4. Put your hands behind your head and let your  elbows fall out to your sides. 5. Hold for __________ seconds. Repeat __________ times. Complete this exercise __________ times a day. Strengthening exercises These exercises build strength and endurance in your neck. Endurance is the ability to use your muscles for a long time, even after your muscles get tired. Exercise D: Upper cervical flexion, isometric 1. Lie on your back with a thin pillow behind your head and a small rolled-up towel under your neck. 2. Gently tuck your chin toward your chest and nod your head down to look toward your feet. Do not lift your head off the pillow. 3. Hold for __________ seconds. 4. Release the tension slowly. Relax your neck muscles completely before you repeat this exercise. Repeat __________ times. Complete this exercise __________ times a day. Exercise E: Cervical extension, isometric  1. Stand about 6 inches (15 cm) away from a wall, with your back facing the wall. 2. Place a soft object, about 6-8 inches (15-20 cm) in diameter, between the back of your head and the wall. A soft object could be a small pillow, a ball, or a folded towel. 3. Gently tilt your head back and press into the soft object. Keep your jaw and forehead relaxed. 4. Hold for __________ seconds. 5. Release the tension slowly. Relax your neck muscles completely before you repeat this exercise. Repeat __________ times. Complete this exercise __________ times a day. Posture and body mechanics  Body mechanics refers to the movements and positions of   your body while you do your daily activities. Posture is part of body mechanics. Good posture and healthy body mechanics can help to relieve stress in your body's tissues and joints. Good posture means that your spine is in its natural S-curve position (your spine is neutral), your shoulders are pulled back slightly, and your head is not tipped forward. The following are general guidelines for applying improved posture and body mechanics to  your everyday activities. Standing  When standing, keep your spine neutral and keep your feet about hip-width apart. Keep a slight bend in your knees. Your ears, shoulders, and hips should line up.  When you do a task in which you stand in one place for a long time, place one foot up on a stable object that is 2-4 inches (5-10 cm) high, such as a footstool. This helps keep your spine neutral. Sitting   When sitting, keep your spine neutral and your keep feet flat on the floor. Use a footrest, if necessary, and keep your thighs parallel to the floor. Avoid rounding your shoulders, and avoid tilting your head forward.  When working at a desk or a computer, keep your desk at a height where your hands are slightly lower than your elbows. Slide your chair under your desk so you are close enough to maintain good posture.  When working at a computer, place your monitor at a height where you are looking straight ahead and you do not have to tilt your head forward or downward to look at the screen. Resting When lying down and resting, avoid positions that are most painful for you. Try to support your neck in a neutral position. You can use a contour pillow or a small rolled-up towel. Your pillow should support your neck but not push on it. This information is not intended to replace advice given to you by your health care provider. Make sure you discuss any questions you have with your health care provider. Document Released: 06/03/2005 Document Revised: 02/08/2016 Document Reviewed: 05/10/2015 Elsevier Interactive Patient Education  2018 [employer] Inc[employer]  Cervical Collar A cervical collar is a device that supports your chin and the back of your head. It is used after a severe neck injury to protect your head and neck. It does this by restricting the movement of the top part of your spine, which is located in your neck. A cervical collar may be used when you have:  A fractured neck.  Ligament  damage.  A spinal cord injury.  What instructions should I follow?  Wear the collar for as long as your health care provider instructs.  Follow your health care provider's instructions about how to put on and take off your collar.  Do not make your collar so tight that you feel pain or it is hard for you to breathe.  Do not remove the collar unless your health care provider says it is okay. Ask your health care provider if you can remove the collar for showering or eating or to apply ice.  Do not drive a car until your health care provider says it is okay.  Keep all follow-up visits as directed by your health care provider. This is important. Any delay in getting necessary care can keep your injury from healing properly.  Apply ice to the injured area: ? Put ice in a plastic bag. ? Place a towel between your skin and the bag. ? Leave the ice on for 20 minutes, 2-3 times per day for the  first 2 days. This information is not intended to replace advice given to you by your health care provider. Make sure you discuss any questions you have with your health care provider. Document Released: 02/24/2004 Document Revised: 10/12/2015 Document Reviewed: 01/10/2014 Elsevier Interactive Patient Education  2018 [employer]   Call or return to clinic prn if these symptoms worsen or fail to improve as anticipated.

## 2017-10-06 NOTE — Addendum Note (Signed)
Addended by: Waymon AmatoJOHNSON, KENDRA R on: 10/06/2017 01:37 PM   Modules accepted: Orders

## 2017-10-06 NOTE — Progress Notes (Signed)
Subjective:    Patient ID: Reginald Kirby., male    DOB: 10/19/1975, 42 y.o.   MRN: [id number]  HPI  42 year old patient who has a history of lumbar stenosis and radiculopathy.  He is status post surgery approximately 2 years ago. 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  He states that he did have a ergonomic consultation at his office.  For the past 2 weeks he has had neck pain.  Pain is aggravated by movement of the head especially to the left.  No radicular symptoms pain is in the posterior neck and right upper back area.  He has tried heat massage therapy and anti-inflammatory medication He has some associated fatigue.  At times it does tend to interfere with work. He has a history of type 2 diabetes  Lab Results  Component Value Date   HGBA1C 7.1 07/31/2017   Past Medical History:  Diagnosis Date  . Anxiety   . Arthritis    DDD  BACK  . Depression   . Diabetes mellitus   . GERD (gastroesophageal reflux disease)   . Headache    HX MIGRAINES   . Hyperlipidemia   . Hypertension   . Sinusitis      Social History   Socioeconomic History  . Marital status: Married    Spouse name: Not on file  . Number of children: 3  . Years of education: Not on file  . Highest education level: Not on file  Occupational History  . Occupation: RN-- [employer]  Social Needs  . Financial resource strain: Not on file  . Food insecurity:    Worry: Not on file    Inability: Not on file  . Transportation needs:    Medical: Not on file    Non-medical: Not on file  Tobacco Use  . Smoking status: Never Smoker  . Smokeless tobacco: Never Used  Substance and Sexual Activity  . Alcohol use: Yes    Comment: occ.   . Drug use: No  . Sexual activity: Not on file  Lifestyle  . Physical activity:    Days per week: Not on file    Minutes per session: Not on file  . Stress: Not on file  Relationships  . Social connections:      Talks on phone: Not on file    Gets together: Not on file    Attends religious service: Not on file    Active member of club or organization: Not on file    Attends meetings of clubs or organizations: Not on file    Relationship status: Not on file  . Intimate partner violence:    Fear of current or ex partner: Not on file    Emotionally abused: Not on file    Physically abused: Not on file    Forced sexual activity: Not on file  Other Topics Concern  . Not on file  Social History Narrative  . Not on file    Past Surgical History:  Procedure Laterality Date  . CARDIAC CATHETERIZATION     2006    OKAY   . epidural injections   2002  . LUMBAR LAMINECTOMY/DECOMPRESSION MICRODISCECTOMY N/A 10/13/2015   Procedure: L3-4 L4-5 L5-S1 Laminectomy;  Surgeon: Barnett Abu, MD;  Location: [facility];  Service: Neurosurgery;  Laterality: N/A;  L3-4 L4-5 L5-S1 Laminectomy    Family History  Problem Relation Age of Onset  .  Colon cancer Father   . Heart disease Mother        has pacemaker  . Diabetes Brother     Allergies  Allergen Reactions  . Demerol Itching    Current Outpatient Medications on File Prior to Visit  Medication Sig Dispense Refill  . ALPRAZolam (XANAX) 1 MG tablet TAKE 1 TABLET BY MOUTH ONCE DAILY AT BEDTIME AS NEEDED FOR ANXIETY 30 tablet 0  . amLODipine (NORVASC) 5 MG tablet TAKE 1 TABLET (5 MG TOTAL) BY MOUTH DAILY. 90 tablet 3  . Blood Glucose Calibration (ACCU-CHEK INSTANT CONTROL) LIQD 1 each by In Vitro route 2 (two) times a week. 1 each 0  . buPROPion (WELLBUTRIN XL) 150 MG 24 hr tablet TAKE 1 TABLET (150 MG TOTAL) BY MOUTH DAILY. 90 tablet 1  . GLIPIZIDE XL 5 MG 24 hr tablet TAKE 1 TABLET BY MOUTH ONCE DAILY 90 tablet 3  . glucose blood (FREESTYLE LITE) test strip 1 each by Other route 3 (three) times daily. Use as instructed 300 each 3  . INVOKANA 300 MG TABS tablet TAKE 1 TABLET BY MOUTH DAILY BEFORE BREAKFAST. 90 tablet 1  .  losartan-hydrochlorothiazide (HYZAAR) 100-25 MG tablet TAKE 1 TABLET BY MOUTH DAILY. 90 tablet 3  . metFORMIN (GLUCOPHAGE) 1000 MG tablet TAKE 1 TABLET BY MOUTH ONCE DAILY WITH A MEAL 90 tablet 3  . naproxen (NAPROSYN) 500 MG tablet Take 1 tablet (500 mg total) by mouth 2 (two) times daily with a meal. 60 tablet 0  . omeprazole (PRILOSEC) 40 MG capsule TAKE 1 CAPSULE (40 MG TOTAL) BY MOUTH DAILY. 90 capsule 1  . pravastatin (PRAVACHOL) 40 MG tablet TAKE 1 TABLET BY MOUTH ONCE DAILY 90 tablet 3   No current facility-administered medications on file prior to visit.     BP 118/78 (BP Location: Right Arm, Patient Position: Sitting, Cuff Size: Large)   Pulse 91   Temp 98.9 F (37.2 C) (Oral)   Wt 246 lb (111.6 kg)   SpO2 99%   BMI 33.36 kg/m     Review of Systems  Constitutional: Positive for fatigue. Negative for appetite change, chills and fever.  HENT: Negative for congestion, dental problem, ear pain, hearing loss, sore throat, tinnitus, trouble swallowing and voice change.   Eyes: Negative for pain, discharge and visual disturbance.  Respiratory: Negative for cough, chest tightness, wheezing and stridor.   Cardiovascular: Negative for chest pain, palpitations and leg swelling.  Gastrointestinal: Negative for abdominal distention, abdominal pain, blood in stool, constipation, diarrhea, nausea and vomiting.  Genitourinary: Negative for difficulty urinating, discharge, flank pain, genital sores, hematuria and urgency.  Musculoskeletal: Positive for neck pain and neck stiffness. Negative for arthralgias, back pain, gait problem, joint swelling and myalgias.  Skin: Negative for rash.  Neurological: Negative for dizziness, syncope, speech difficulty, weakness, numbness and headaches.  Hematological: Negative for adenopathy. Does not bruise/bleed easily.  Psychiatric/Behavioral: Negative for behavioral problems and dysphoric mood. The patient is not nervous/anxious.        Objective:    Physical Exam  Constitutional: He appears well-developed and well-nourished. No distress.  Neck: Normal range of motion.  Range of motion preserved.  Some increased pain with head turning to the left  Neurological:  Reflexes somewhat blunted but symmetrical Motor exam upper extremities normal          Assessment & Plan:   Neck pain History of lumbar congenital spinal stenosis and HNP.  We will give Depo-Medrol 80 Trial of soft cervical collar  Follow-up neurosurgery and consideration of cervical MRI if unimproved  Rogelia BogaKWIATKOWSKI,[place] FRANK

## 2017-10-09 ENCOUNTER — Encounter: Payer: Self-pay | Admitting: Internal Medicine

## 2017-10-13 ENCOUNTER — Encounter: Payer: Self-pay | Admitting: Internal Medicine

## 2017-10-21 ENCOUNTER — Ambulatory Visit (INDEPENDENT_AMBULATORY_CARE_PROVIDER_SITE_OTHER): Payer: No Typology Code available for payment source | Admitting: Internal Medicine

## 2017-10-21 ENCOUNTER — Encounter: Payer: Self-pay | Admitting: Internal Medicine

## 2017-10-21 VITALS — BP 122/64 | HR 77 | Temp 98.4°F | Wt 250.0 lb

## 2017-10-21 DIAGNOSIS — M549 Dorsalgia, unspecified: Secondary | ICD-10-CM | POA: Diagnosis not present

## 2017-10-21 NOTE — Patient Instructions (Signed)
Physical  therapy consultation as discussed

## 2017-10-21 NOTE — Progress Notes (Signed)
Subjective:    Patient ID: Reginald Kirby., male    DOB: 09/21/1975, 42 y.o.   MRN: [id number]  HPI  42 year old patient who was seen about 2 weeks ago.  He continues to have pain involving the right upper back area .  Pain seems maximal to the area just medial to the right scapula and is aggravated by movement of the upper back and neck area.  He has been treated with anti-inflammatory medications heat and massage therapy but still remains symptomatic.  2 weeks ago he was treated with Depo-Medrol 80 mg IM without benefit.  This was felt to be more of a cervical strain.  Today it seems the pain is maximal in the right upper back area  medial to the scapula  Past Medical History:  Diagnosis Date  . Anxiety   . Arthritis    DDD  BACK  . Depression   . Diabetes mellitus   . GERD (gastroesophageal reflux disease)   . Headache    HX MIGRAINES   . Hyperlipidemia   . Hypertension   . Sinusitis      Social History   Socioeconomic History  . Marital status: Married    Spouse name: Not on file  . Number of children: 3  . Years of education: Not on file  . Highest education level: Not on file  Occupational History  . Occupation: RN-- [employer]  Social Needs  . Financial resource strain: Not on file  . Food insecurity:    Worry: Not on file    Inability: Not on file  . Transportation needs:    Medical: Not on file    Non-medical: Not on file  Tobacco Use  . Smoking status: Never Smoker  . Smokeless tobacco: Never Used  Substance and Sexual Activity  . Alcohol use: Yes    Comment: occ.   . Drug use: No  . Sexual activity: Not on file  Lifestyle  . Physical activity:    Days per week: Not on file    Minutes per session: Not on file  . Stress: Not on file  Relationships  . Social connections:    Talks on phone: Not on file    Gets together: Not on file    Attends religious service: Not on file    Active member of club or organization: Not on file    Attends  meetings of clubs or organizations: Not on file    Relationship status: Not on file  . Intimate partner violence:    Fear of current or ex partner: Not on file    Emotionally abused: Not on file    Physically abused: Not on file    Forced sexual activity: Not on file  Other Topics Concern  . Not on file  Social History Narrative  . Not on file    Past Surgical History:  Procedure Laterality Date  . CARDIAC CATHETERIZATION     2006    OKAY   . epidural injections   2002  . LUMBAR LAMINECTOMY/DECOMPRESSION MICRODISCECTOMY N/A 10/13/2015   Procedure: L3-4 L4-5 L5-S1 Laminectomy;  Surgeon: Barnett Abu, MD;  Location: [facility];  Service: Neurosurgery;  Laterality: N/A;  L3-4 L4-5 L5-S1 Laminectomy    Family History  Problem Relation Age of Onset  . Colon cancer Father   . Heart disease Mother        has pacemaker  . Diabetes Brother     Allergies  Allergen Reactions  .  Demerol Itching    Current Outpatient Medications on File Prior to Visit  Medication Sig Dispense Refill  . ALPRAZolam (XANAX) 1 MG tablet TAKE 1 TABLET BY MOUTH ONCE DAILY AT BEDTIME AS NEEDED FOR ANXIETY 30 tablet 0  . amLODipine (NORVASC) 5 MG tablet TAKE 1 TABLET (5 MG TOTAL) BY MOUTH DAILY. 90 tablet 3  . Blood Glucose Calibration (ACCU-CHEK INSTANT CONTROL) LIQD 1 each by In Vitro route 2 (two) times a week. 1 each 0  . buPROPion (WELLBUTRIN XL) 150 MG 24 hr tablet TAKE 1 TABLET (150 MG TOTAL) BY MOUTH DAILY. 90 tablet 1  . GLIPIZIDE XL 5 MG 24 hr tablet TAKE 1 TABLET BY MOUTH ONCE DAILY 90 tablet 3  . glucose blood (FREESTYLE LITE) test strip 1 each by Other route 3 (three) times daily. Use as instructed 300 each 3  . INVOKANA 300 MG TABS tablet TAKE 1 TABLET BY MOUTH DAILY BEFORE BREAKFAST. 90 tablet 1  . losartan-hydrochlorothiazide (HYZAAR) 100-25 MG tablet TAKE 1 TABLET BY MOUTH DAILY. 90 tablet 3  . metFORMIN (GLUCOPHAGE) 1000 MG tablet TAKE 1 TABLET BY MOUTH ONCE DAILY WITH A MEAL 90 tablet 3    . naproxen (NAPROSYN) 500 MG tablet Take 1 tablet (500 mg total) by mouth 2 (two) times daily with a meal. 60 tablet 0  . omeprazole (PRILOSEC) 40 MG capsule TAKE 1 CAPSULE (40 MG TOTAL) BY MOUTH DAILY. 90 capsule 1  . pravastatin (PRAVACHOL) 40 MG tablet TAKE 1 TABLET BY MOUTH ONCE DAILY 90 tablet 3  . tiZANidine (ZANAFLEX) 4 MG capsule Take 1 capsule (4 mg total) by mouth 3 (three) times daily. 30 capsule 2   No current facility-administered medications on file prior to visit.     BP 122/64 (BP Location: Right Arm, Patient Position: Sitting, Cuff Size: Large)   Pulse 77   Temp 98.4 F (36.9 C) (Oral)   Wt 250 lb (113.4 kg)   SpO2 98%   BMI 33.91 kg/m    Review of Systems  Musculoskeletal: Positive for back pain, neck pain and neck stiffness.       Objective:   Physical Exam  Constitutional: He appears well-developed and well-nourished. No distress.  Neck: Normal range of motion.  Musculoskeletal:  Patient did have some tenderness in the soft tissue area medial to the right scapular area          Assessment & Plan:   Right upper back pain.  Suspect musculoligamentous.  This has failed stretching massage and heat therapy as well as anti-inflammatory medications ;  will set up for formal physical therapy  Rogelia Boga

## 2017-10-22 ENCOUNTER — Ambulatory Visit: Payer: No Typology Code available for payment source | Attending: Internal Medicine

## 2017-10-22 ENCOUNTER — Other Ambulatory Visit: Payer: Self-pay

## 2017-10-22 DIAGNOSIS — R252 Cramp and spasm: Secondary | ICD-10-CM | POA: Insufficient documentation

## 2017-10-22 DIAGNOSIS — R293 Abnormal posture: Secondary | ICD-10-CM | POA: Insufficient documentation

## 2017-10-22 DIAGNOSIS — M546 Pain in thoracic spine: Secondary | ICD-10-CM | POA: Diagnosis not present

## 2017-10-22 NOTE — Therapy (Signed)
[facility] [address], [address] [place], [place], [address] Phone: [phone]   Fax:  [phone]  Physical Therapy Evaluation  Patient Details  Name: Reginald Kirby. MRN: [id number] Date of Birth: Nov 01, 1975 Referring Provider: Eleonore Chiquito, MD   Encounter Date: 10/22/2017  PT End of Session - 10/22/17 1518    Visit Number  1    Date for PT Re-Evaluation  12/17/17    Authorization Type  [facility]: [facility]    PT Start Time  1432    PT Stop Time  1520    PT Time Calculation (min)  48 min    Activity Tolerance  Patient tolerated treatment well    Behavior During Therapy  [facility] for tasks assessed/performed       Past Medical History:  Diagnosis Date  . Anxiety   . Arthritis    DDD  BACK  . Depression   . Diabetes mellitus   . GERD (gastroesophageal reflux disease)   . Headache    HX MIGRAINES   . Hyperlipidemia   . Hypertension   . Sinusitis     Past Surgical History:  Procedure Laterality Date  . CARDIAC CATHETERIZATION     2006    OKAY   . epidural injections   2002  . LUMBAR LAMINECTOMY/DECOMPRESSION MICRODISCECTOMY N/A 10/13/2015   Procedure: L3-4 L4-5 L5-S1 Laminectomy;  Surgeon: Barnett Abu, MD;  Location: [facility];  Service: Neurosurgery;  Laterality: N/A;  L3-4 L4-5 L5-S1 Laminectomy    There were no vitals filed for this visit.   Subjective Assessment - 10/22/17 1438    Subjective  Pt presents to PT with complaints of Rt sided thoracic pain that began gradually ~2 weeks ago.  Pt reports local pain over the Rt medial scapular pain.  Pt had steroid injection yesterday.      Pertinent History  lumbar laminectomy and microdiscectomy- 2017    Diagnostic tests  none    Patient Stated Goals  reduce pain in the back    Currently in Pain?  Yes    Pain Score  7     Pain Location  Thoracic    Pain Orientation  Right    Pain Descriptors / Indicators  Stabbing;Tightness;Cramping    Pain Onset  1 to  4 weeks ago    Pain Frequency  Constant    Aggravating Factors   when in bed, turning head, at the end of the day, reaching across the body    Pain Relieving Factors  heating pad, massage         [facility] PT Assessment - 10/22/17 0001      Assessment   Medical Diagnosis  upper back pain on Rt side    Referring Provider  Eleonore Chiquito, MD    Onset Date/Surgical Date  10/06/17    Hand Dominance  Right    Next MD Visit  none    Prior Therapy  none      Precautions   Precautions  None      Restrictions   Weight Bearing Restrictions  No      Balance Screen   Has the patient fallen in the past 6 months  No    Has the patient had a decrease in activity level because of a fear of falling?   No    Is the patient reluctant to leave their home because of a fear of falling?   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  cervical rotation limited by 25% with Rt scapular pain reported at end range of each motion.  UE AROM is full without pain.         PROM   Overall PROM   Within functional limits for  tasks performed      Strength   Overall Strength  Within functional limits for tasks performed      Palpation   Spinal mobility  reduced PA mobility C5-L1 without pain    Palpation comment  trigger points over Rt rhomboids, subscapularis and thoracic paraspinals.  No palpable tenderness or trigger points on the Lt      Transfers   Transfers  Independent with all Transfers      Ambulation/Gait   Ambulation/Gait  Yes    Gait Pattern  Within Functional Limits                Objective measurements completed on examination: See above findings.              PT Education - 10/22/17 1516    Education provided  Yes    Education Details  [email or web address] access code    Person(s) Educated  Patient    Methods  Explanation;Handout    Comprehension  Verbalized understanding;Returned demonstration       PT Short Term Goals - 10/22/17 1539      PT SHORT TERM GOAL #1   Title  He will be independent with inital HEP     Time  4    Period  Weeks    Status  New    Target Date  11/19/17      PT SHORT TERM GOAL #2   Title  report a 25% reduction in thoracic pain with sleep and turning head    Time  4    Period  Weeks    Status  New    Target Date  11/19/17      PT SHORT TERM GOAL #3   Title  verbalize and demonstrate postural corrections for improved alignment with sitting at work    Time  4    Period  Weeks    Status  New    Target Date  11/19/17      PT SHORT TERM GOAL #4   Title  sleep with 50% fewer interruptions due to thoracic     Time  4    Period  Weeks    Status  New    Target Date  11/19/17        PT Long Term Goals - 10/22/17 1558      PT LONG TERM GOAL #1   Title  be independent in advanced HEP    Time  8    Period  Weeks    Status  New    Target Date  12/17/17      PT LONG TERM GOAL #2   Title  reduced FOTO to < or = to 24% limitation    Time  8    Period  Weeks    Status  New    Target Date  12/17/17      PT LONG TERM GOAL #3   Title   return to upper body weight training without limitation due to pain    Time  8    Period  Weeks    Status  New    Target Date  12/17/17      PT LONG TERM GOAL #4   Title  report < or = to 2/10 Rt thoracic pain with turning head or lifting    Time  8    Period  Weeks    Status  New    Target Date  12/17/17      PT LONG TERM GOAL #5   Title  sleep without interruption due to thoracic pain    Time  8    Period  Weeks    Status  New    Target Date  12/17/17             Plan - 10/22/17 1528    Clinical Impression Statement  Pt presents to PT with complaints of Rt medial scapular border pain that began ~2-3 weeks ago without cause.  Pt reports stiffness in the thoracic spine, burning and cramping on the Rt and rates it as 6/10.  Pain increases with lying on his side, turning the head and lifting.  Pt has not been lifting weights at the gym since the pain began.  Pt demonstrates scapular protraction, forward head and flexed trunk posture in sitting.  Pt with reduced segmental mobility in the lower cervical and thoracic spine, and trigger points/tension over Rt rhomboids, subscapularis and thoracic paraspinals.  Pt will benefit from skilled PT for posture and body mechanics education, dry needling, throacic mobs and flexibility to reduce pain.      History and Personal Factors relevant to plan of care:  lumbar surgery 2017    Clinical Presentation  Stable    Clinical Presentation due to:  new onset, stable    Clinical Decision Making  Low    Rehab Potential  Good    PT Frequency  2x / week    PT Duration  8 weeks    PT Treatment/Interventions  ADLs/Self Care Home Management;Cryotherapy;Electrical Stimulation;Moist Heat;Ultrasound;Therapeutic exercise;Therapeutic activities;Neuromuscular re-education;Patient/family education    PT Next Visit Plan  dry needling to thoracic multifidi and Rt rhomboids and subscapularis, thoracic flexibility, postural strength    PT Home Exercise Plan   [email or web address] access code    Consulted and Agree with Plan of Care  Patient       Patient will benefit from skilled therapeutic intervention in order to improve the following deficits and impairments:  Pain, Improper body mechanics, Increased muscle spasms, Postural dysfunction, Decreased activity tolerance, Decreased range of motion, Impaired flexibility, Hypomobility  Visit Diagnosis: Pain in thoracic spine - Plan: PT plan of  care cert/re-cert  Cramp and spasm - Plan: PT plan of care cert/re-cert  Abnormal posture - Plan: PT plan of care cert/re-cert     Problem List Patient Active Problem List   Diagnosis Date Noted  . Lumbar radiculopathy 10/13/2015  . Family history of colon cancer 07/06/2015  . Diabetes mellitus without complication (HCC) 04/03/2015  . Lumbar pain 01/21/2015  . Dyslipidemia 10/23/2011  . Hypertension 10/23/2011     Lorrene Reid, PT 10/22/17 4:05 PM  [facility] [address] [place], [place], [address] Phone: [phone]   Fax:  [phone]  Name: Reginald Kirby. MRN: [id number] Date of Birth: 07-03-1975

## 2017-10-22 NOTE — Patient Instructions (Signed)
Access Code: [email or web address]  URL: [email or web address]  Date: 10/22/2017  Prepared by: Lorrene Reid   Exercises  Cat-Camel to Child's Pose - 10 reps - 1 sets - 5 hold - 2x daily - 7x weekly  Child's Pose Stretch - 3 reps - 1 sets - 20 hold - 1x daily - 7x weekly  Child's Pose with Sidebending - 10 reps - 20 hold - 2x daily - 7x weekly  Seated Trunk Rotation - Arms Crossed - 3 reps - 20 hold - 3x daily - 7x weekly  Thoracic Mobilization on Foam Roll - Hands Clasped - 2x daily - 7x weekly  Correct Standing Posture - 10 reps - 3 sets - 1x daily - 7x weekly  Seated Correct Posture - 10 reps - 3 sets - 1x daily - 7x weekly   [facility] [address] [place], [place] [address] Phone # [phone] Fax [phone]

## 2017-10-24 ENCOUNTER — Ambulatory Visit: Payer: No Typology Code available for payment source | Admitting: Physical Therapy

## 2017-10-24 ENCOUNTER — Encounter: Payer: Self-pay | Admitting: Physical Therapy

## 2017-10-24 DIAGNOSIS — R252 Cramp and spasm: Secondary | ICD-10-CM

## 2017-10-24 DIAGNOSIS — M546 Pain in thoracic spine: Secondary | ICD-10-CM | POA: Diagnosis not present

## 2017-10-24 DIAGNOSIS — R293 Abnormal posture: Secondary | ICD-10-CM

## 2017-10-24 NOTE — Therapy (Signed)
[facility] [address] [place], [place], [address] Phone: [phone]   Fax:  [phone]  Physical Therapy Treatment  Patient Details  Name: Reginald Kirby. MRN: [id number] Date of Birth: 15-Apr-1976 Referring Provider: Eleonore Chiquito, MD   Encounter Date: 10/24/2017  PT End of Session - 10/24/17 1551    Visit Number  2    Date for PT Re-Evaluation  12/17/17    Authorization Type  [facility]: [facility]    PT Start Time  0845    PT Stop Time  0935    PT Time Calculation (min)  50 min    Activity Tolerance  Patient tolerated treatment well       Past Medical History:  Diagnosis Date  . Anxiety   . Arthritis    DDD  BACK  . Depression   . Diabetes mellitus   . GERD (gastroesophageal reflux disease)   . Headache    HX MIGRAINES   . Hyperlipidemia   . Hypertension   . Sinusitis     Past Surgical History:  Procedure Laterality Date  . CARDIAC CATHETERIZATION     2006    OKAY   . epidural injections   2002  . LUMBAR LAMINECTOMY/DECOMPRESSION MICRODISCECTOMY N/A 10/13/2015   Procedure: L3-4 L4-5 L5-S1 Laminectomy;  Surgeon: Barnett Abu, MD;  Location: [facility];  Service: Neurosurgery;  Laterality: N/A;  L3-4 L4-5 L5-S1 Laminectomy    There were no vitals filed for this visit.  Subjective Assessment - 10/24/17 0849    Subjective  Just a little pain right now.    (Pended)     Pertinent History  lumbar laminectomy and microdiscectomy- 2017  (Pended)     Currently in Pain?  Yes  (Pended)     Pain Score  5   (Pended)     Pain Location  Scapula  (Pended)     Pain Orientation  Right  (Pended)     Pain Type  Chronic pain  (Pended)                        [facility] Adult PT Treatment/Exercise - 10/24/17 0001      Shoulder Exercises: Stretch   Cross Chest Stretch  3 reps;20 seconds    Cross Chest Stretch Limitations  doorframe    Other Shoulder Stretches  double arm rhomboid stretch 3x 20  sec    Other Shoulder Stretches  verbal review of previous HEP      Moist Heat Therapy   Number Minutes Moist Heat  12 Minutes    Moist Heat Location  Other (comment) right scapula      Electrical Stimulation   Electrical Stimulation Location  right scapula    Electrical Stimulation Action  IFC    Electrical Stimulation Parameters  12 ma 15 min    Electrical Stimulation Goals  Pain      Manual Therapy   Soft tissue mobilization  right rhomboids, subscapularis    Scapular Mobilization  medial, lateral scapular glides grade 3/4        Trigger Point Dry Needling - 10/24/17 1550    Consent Given?  Yes    Education Handout Provided  Yes    Muscles Treated Upper Body  Levator scapulae;Rhomboids;Subscapularis    Levator Scapulae Response  Twitch response elicited;Palpable increased muscle length    Rhomboids Response  Twitch response elicited;Palpable increased muscle length    Subscapularis Response  Twitch response  elicited;Palpable increased muscle length         Right side.  PT Education - 10/24/17 0924    Education provided  Yes    Education Details  rhomboid stretches in doorway; dry needling after care    Person(s) Educated  Patient    Methods  Explanation;Demonstration;Handout    Comprehension  Verbalized understanding;Returned demonstration       PT Short Term Goals - 10/22/17 1539      PT SHORT TERM GOAL #1   Title  He will be independent with inital HEP     Time  4    Period  Weeks    Status  New    Target Date  11/19/17      PT SHORT TERM GOAL #2   Title  report a 25% reduction in thoracic pain with sleep and turning head    Time  4    Period  Weeks    Status  New    Target Date  11/19/17      PT SHORT TERM GOAL #3   Title  verbalize and demonstrate postural corrections for improved alignment with sitting at work    Time  4    Period  Weeks    Status  New    Target Date  11/19/17      PT SHORT TERM GOAL #4   Title  sleep with 50% fewer  interruptions due to thoracic     Time  4    Period  Weeks    Status  New    Target Date  11/19/17        PT Long Term Goals - 10/22/17 1558      PT LONG TERM GOAL #1   Title  be independent in advanced HEP    Time  8    Period  Weeks    Status  New    Target Date  12/17/17      PT LONG TERM GOAL #2   Title  reduced FOTO to < or = to 24% limitation    Time  8    Period  Weeks    Status  New    Target Date  12/17/17      PT LONG TERM GOAL #3   Title  return to upper body weight training without limitation due to pain    Time  8    Period  Weeks    Status  New    Target Date  12/17/17      PT LONG TERM GOAL #4   Title  report < or = to 2/10 Rt thoracic pain with turning head or lifting    Time  8    Period  Weeks    Status  New    Target Date  12/17/17      PT LONG TERM GOAL #5   Title  sleep without interruption due to thoracic pain    Time  8    Period  Weeks    Status  New    Target Date  12/17/17            Plan - 10/24/17 1552    Clinical Impression Statement  The patient is very receptive to trying dry needling.  He has multiple tender points especially in rhomboids.  Multiple twitch responses produced which is a good prognostic indicator.  He reports following session that he feels "much looser."  Therapist closely monitoring response with all treatment interventions.  Rehab Potential  Good    PT Frequency  2x / week    PT Duration  8 weeks    PT Treatment/Interventions  ADLs/Self Care Home Management;Cryotherapy;Electrical Stimulation;Moist Heat;Ultrasound;Therapeutic exercise;Therapeutic activities;Neuromuscular re-education;Patient/family education    PT Next Visit Plan  assess response to dry needling to Rt rhomboids and subscapularis #1, thoracic flexibility, postural strength;  ES/heat as needed       Patient will benefit from skilled therapeutic intervention in order to improve the following deficits and impairments:  Pain, Improper body  mechanics, Increased muscle spasms, Postural dysfunction, Decreased activity tolerance, Decreased range of motion, Impaired flexibility, Hypomobility  Visit Diagnosis: Pain in thoracic spine  Cramp and spasm  Abnormal posture     Problem List Patient Active Problem List   Diagnosis Date Noted  . Lumbar radiculopathy 10/13/2015  . Family history of colon cancer 07/06/2015  . Diabetes mellitus without complication (HCC) 04/03/2015  . Lumbar pain 01/21/2015  . Dyslipidemia 10/23/2011  . Hypertension 10/23/2011   Lavinia Sharps, PT 10/24/17 3:58 PM Phone: [phone] Fax: [phone]  Vivien Presto 10/24/2017, 3:57 PM  [facility] [address], [address] [place], [place], [address] Phone: [phone]   Fax:  [phone]  Name: Kervin Bones. MRN: [id number] Date of Birth: Nov 05, 1975

## 2017-10-24 NOTE — Patient Instructions (Signed)
    Access Code: [email or web address]  URL: [email or web address]  Date: 10/24/2017  Prepared by: Lavinia Sharps   Exercises  Doorway Rhomboid Stretch - 3 reps - 1 sets - 20 hold - 1x daily - 7x weekly  Standing Lean Away Doorway Stretch - 3 reps - 1 sets - 20 hold - 1x daily - 7x weekly      Trigger Point Dry Needling  . What is Trigger Point Dry Needling (DN)? o DN is a physical therapy technique used to treat muscle pain and dysfunction. Specifically, DN helps deactivate muscle trigger points (muscle knots).  o A thin filiform needle is used to penetrate the skin and stimulate the underlying trigger point. The goal is for a local twitch response (LTR) to occur and for the trigger point to relax. No medication of any kind is injected during the procedure.   . What Does Trigger Point Dry Needling Feel Like?  o The procedure feels different for each individual patient. Some patients report that they do not actually feel the needle enter the skin and overall the process is not painful. Very mild bleeding may occur. However, many patients feel a deep cramping in the muscle in which the needle was inserted. This is the local twitch response.   Marland Kitchen How Will I feel after the treatment? o Soreness is normal, and the onset of soreness may not occur for a few hours. Typically this soreness does not last longer than two days.  o Bruising is uncommon, however; ice can be used to decrease any possible bruising.  o In rare cases feeling tired or nauseous after the treatment is normal. In addition, your symptoms may get worse before they get better, this period will typically not last longer than 24 hours.   . What Can I do After My Treatment? o Increase your hydration by drinking more water for the next 24 hours. o You may place ice or heat on the areas treated that have become sore, however, do not use heat on inflamed or bruised areas. Heat often brings more relief post needling. o You can  continue your regular activities, but vigorous activity is not recommended initially after the treatment for 24 hours. o DN is best combined with other physical therapy such as strengthening, stretching, and other therapies.     Lavinia Sharps PT [facility] [address] [place], [place] [address] Phone # [phone] Fax [phone]

## 2017-10-29 ENCOUNTER — Ambulatory Visit: Payer: No Typology Code available for payment source | Admitting: Physical Therapy

## 2017-10-29 ENCOUNTER — Encounter: Payer: Self-pay | Admitting: Physical Therapy

## 2017-10-29 DIAGNOSIS — R293 Abnormal posture: Secondary | ICD-10-CM

## 2017-10-29 DIAGNOSIS — M546 Pain in thoracic spine: Secondary | ICD-10-CM

## 2017-10-29 DIAGNOSIS — R252 Cramp and spasm: Secondary | ICD-10-CM

## 2017-10-29 NOTE — Therapy (Addendum)
[facility] [address] [place], [place], [address] Phone: [phone]   Fax:  [phone]  Physical Therapy Treatment/Discharge Summary   Patient Details  Name: Reginald Kirby. MRN: [id number] Date of Birth: Jul 31, 1975 Referring Provider: Bluford Kaufmann, MD   Encounter Date: 10/29/2017  PT End of Session - 10/29/17 1444    Visit Number  3    Date for PT Re-Evaluation  12/17/17    Authorization Type  [facility]: [facility]    PT Start Time  1444    PT Stop Time  1524    PT Time Calculation (min)  40 min    Activity Tolerance  Patient tolerated treatment well    Behavior During Therapy  [facility] for tasks assessed/performed       Past Medical History:  Diagnosis Date  . Anxiety   . Arthritis    DDD  BACK  . Depression   . Diabetes mellitus   . GERD (gastroesophageal reflux disease)   . Headache    HX MIGRAINES   . Hyperlipidemia   . Hypertension   . Sinusitis     Past Surgical History:  Procedure Laterality Date  . CARDIAC CATHETERIZATION     2006    OKAY   . epidural injections   2002  . LUMBAR LAMINECTOMY/DECOMPRESSION MICRODISCECTOMY N/A 10/13/2015   Procedure: L3-4 L4-5 L5-S1 Laminectomy;  Surgeon: Kristeen Miss, MD;  Location: [facility];  Service: Neurosurgery;  Laterality: N/A;  L3-4 L4-5 L5-S1 Laminectomy    There were no vitals filed for this visit.  Subjective Assessment - 10/29/17 1448    Subjective  I am loosening up. The needling helped make my stretches work better.     Pertinent History  lumbar laminectomy and microdiscectomy- 2017    Currently in Pain?  Yes    Pain Score  5     Pain Location  Thoracic with movement    Pain Orientation  Right    Pain Descriptors / Indicators  Aching    Aggravating Factors   when I turn a certain way with my neck    Pain Relieving Factors  heat massage    Multiple Pain Sites  No                       [facility] Adult PT Treatment/Exercise  - 10/29/17 0001      Self-Care   Self-Care  -- Use of lumbar roll when seated      Shoulder Exercises: Supine   Other Supine Exercises  Foam roll gliding techniques for cervical and thoracic PTA demo and verbal instructions how to do     Other Supine Exercises  Horizontal abduction 10x, angel arm stretch 10x , rolling side to side 45 sec, protraction/retraction 10x VC on technique      Shoulder Exercises: ROM/Strengthening   UBE (Upper Arm Bike)  Reverse L2 x 5 min with towel roll for lumbar support PTA present for status update      Electrical Stimulation   Electrical Stimulation Location  Will do at home today               PT Short Term Goals - 10/29/17 1452      PT SHORT TERM GOAL #1   Title  He will be independent with inital HEP     Time  4    Period  Weeks    Status  Achieved  PT SHORT TERM GOAL #2   Title  report a 25% reduction in thoracic pain with sleep and turning head    Time  4    Period  Weeks    Status  Achieved 40%      PT SHORT TERM GOAL #3   Title  verbalize and demonstrate postural corrections for improved alignment with sitting at work    Time  4    Period  Weeks    Status  Achieved        PT Long Term Goals - 10/22/17 1558      PT LONG TERM GOAL #1   Title  be independent in advanced HEP    Time  8    Period  Weeks    Status  New    Target Date  12/17/17      PT LONG TERM GOAL #2   Title  reduced FOTO to < or = to 24% limitation    Time  8    Period  Weeks    Status  New    Target Date  12/17/17      PT LONG TERM GOAL #3   Title  return to upper body weight training without limitation due to pain    Time  8    Period  Weeks    Status  New    Target Date  12/17/17      PT LONG TERM GOAL #4   Title  report < or = to 2/10 Rt thoracic pain with turning head or lifting    Time  8    Period  Weeks    Status  New    Target Date  12/17/17      PT LONG TERM GOAL #5   Title  sleep without interruption due to thoracic pain     Time  8    Period  Weeks    Status  New    Target Date  12/17/17            Plan - 10/29/17 1444    Clinical Impression Statement  Pt reported good results with the dry needling: felt looser and was able to get more out of his stretches. He was instructed in an at home fascial gliding routine for his neck and thoracic spine using the soft foam roll.  Pt was able to demonstrate improved cervical rotation LT and thoracic rotation right.  He reports he plans to buy a roll for home.  He meets goals today for sleeping and bed mobility as they are 40% less painful/easier.     Rehab Potential  Good    PT Frequency  2x / week    PT Duration  8 weeks    PT Treatment/Interventions  ADLs/Self Care Home Management;Cryotherapy;Electrical Stimulation;Moist Heat;Ultrasound;Therapeutic exercise;Therapeutic activities;Neuromuscular re-education;Patient/family education    PT Next Visit Plan  DN if seen by PT , thoracic mobility, postural endurance    PT Home Exercise Plan  [email or web address] access code    Consulted and Agree with Plan of Care  Patient       Patient will benefit from skilled therapeutic intervention in order to improve the following deficits and impairments:  Pain, Improper body mechanics, Increased muscle spasms, Postural dysfunction, Decreased activity tolerance, Decreased range of motion, Impaired flexibility, Hypomobility  Visit Diagnosis: Pain in thoracic spine  Cramp and spasm  Abnormal posture    PHYSICAL THERAPY DISCHARGE SUMMARY  Visits from Start of Care: 3  Current functional level  related to goals / functional outcomes: The patient was instructed in a basic HEP and home self care strategies.  Good initial response to dry needling 1x.  He cancelled his remaining appointment "on the road."  He has not called back to resume and his PT chart has been inactive > 2 months.  Will discharge at this time.  Unable to assess progress toward goals.     Remaining deficits: As  above   Education / Equipment: Basic HEP Plan: Patient agrees to discharge.  Patient goals were not met. Patient is being discharged due to not returning since the last visit.  ?????         Problem List Patient Active Problem List   Diagnosis Date Noted  . Lumbar radiculopathy 10/13/2015  . Family history of colon cancer 07/06/2015  . Diabetes mellitus without complication ([place]) 29/51/8841  . Lumbar pain 01/21/2015  . Dyslipidemia 10/23/2011  . Hypertension 10/23/2011   Ruben Im, PT 01/16/18 8:22 AM Phone: [phone] Fax: [phone]  Kiko Ripp, PTA 10/29/2017, 3:28 PM  [facility] [address] [place], [place], [address] Phone: [phone]   Fax:  [phone]  Name: Reginald Kirby. MRN: [id number] Date of Birth: March 12, 1976

## 2017-11-03 ENCOUNTER — Other Ambulatory Visit: Payer: Self-pay | Admitting: Internal Medicine

## 2017-11-07 ENCOUNTER — Encounter: Payer: Self-pay | Admitting: Internal Medicine

## 2017-11-08 ENCOUNTER — Telehealth: Payer: No Typology Code available for payment source | Admitting: Nurse Practitioner

## 2017-11-08 DIAGNOSIS — J0100 Acute maxillary sinusitis, unspecified: Secondary | ICD-10-CM

## 2017-11-08 MED ORDER — AMOXICILLIN-POT CLAVULANATE 875-125 MG PO TABS: 1.0000 | ORAL_TABLET | Freq: Two times a day (BID) | ORAL | 0 refills | Status: DC

## 2017-11-08 NOTE — Progress Notes (Signed)

## 2017-11-11 ENCOUNTER — Other Ambulatory Visit: Payer: Self-pay | Admitting: Internal Medicine

## 2017-11-22 ENCOUNTER — Other Ambulatory Visit: Payer: Self-pay | Admitting: Internal Medicine

## 2017-11-25 ENCOUNTER — Ambulatory Visit (INDEPENDENT_AMBULATORY_CARE_PROVIDER_SITE_OTHER): Payer: No Typology Code available for payment source | Admitting: Internal Medicine

## 2017-11-25 ENCOUNTER — Encounter: Payer: Self-pay | Admitting: Internal Medicine

## 2017-11-25 VITALS — BP 124/82 | HR 78 | Temp 98.7°F | Wt 245.2 lb

## 2017-11-25 DIAGNOSIS — I10 Essential (primary) hypertension: Secondary | ICD-10-CM | POA: Diagnosis not present

## 2017-11-25 DIAGNOSIS — E785 Hyperlipidemia, unspecified: Secondary | ICD-10-CM | POA: Diagnosis not present

## 2017-11-25 DIAGNOSIS — Z0000 Encounter for general adult medical examination without abnormal findings: Secondary | ICD-10-CM

## 2017-11-25 DIAGNOSIS — E119 Type 2 diabetes mellitus without complications: Secondary | ICD-10-CM

## 2017-11-25 NOTE — Progress Notes (Signed)
Subjective:    Patient ID: Reginald Kirby., male    DOB: 04-Sep-1975, 42 y.o.   MRN: [id number]  HPI  42 year old patient who is seen today for a preventive health examination. He had a screening colonoscopy in December of last year due to a family history of colon cancer. He has type 2 diabetes.  More recently he has enjoyed improved glycemic control with fasting blood sugars in the 120 range and blood sugars later in the day in the 110 area. He feels well today has been much more physically active. He has essential hypertension and dyslipidemia.  He has completed physical therapy for upper back muscular pain.  Family history Father died at 83 complications of colon cancer.  He had a history of a schizoaffective disorder Mother age 26 status post pacemaker.  History of hypertension and OSA 2 brothers both with OSA one brother with type 1 diabetes and hypertension brother and 2 sisters also with hypertension.  One sister with metabolic syndrome.  Social history married 2 sons and 1 daughter  Past Medical History:  Diagnosis Date  . Anxiety   . Arthritis    DDD  BACK  . Depression   . Diabetes mellitus   . GERD (gastroesophageal reflux disease)   . Headache    HX MIGRAINES   . Hyperlipidemia   . Hypertension   . Sinusitis      Social History   Socioeconomic History  . Marital status: Married    Spouse name: Not on file  . Number of children: 3  . Years of education: Not on file  . Highest education level: Not on file  Occupational History  . Occupation: RN-- [employer]  Social Needs  . Financial resource strain: Not on file  . Food insecurity:    Worry: Not on file    Inability: Not on file  . Transportation needs:    Medical: Not on file    Non-medical: Not on file  Tobacco Use  . Smoking status: Never Smoker  . Smokeless tobacco: Never Used  Substance and Sexual Activity  . Alcohol use: Yes    Comment: occ.   . Drug use: No  . Sexual activity: Not on  file  Lifestyle  . Physical activity:    Days per week: Not on file    Minutes per session: Not on file  . Stress: Not on file  Relationships  . Social connections:    Talks on phone: Not on file    Gets together: Not on file    Attends religious service: Not on file    Active member of club or organization: Not on file    Attends meetings of clubs or organizations: Not on file    Relationship status: Not on file  . Intimate partner violence:    Fear of current or ex partner: Not on file    Emotionally abused: Not on file    Physically abused: Not on file    Forced sexual activity: Not on file  Other Topics Concern  . Not on file  Social History Narrative  . Not on file    Past Surgical History:  Procedure Laterality Date  . CARDIAC CATHETERIZATION     2006    OKAY   . epidural injections   2002  . LUMBAR LAMINECTOMY/DECOMPRESSION MICRODISCECTOMY N/A 10/13/2015   Procedure: L3-4 L4-5 L5-S1 Laminectomy;  Surgeon: Barnett Abu, MD;  Location: [facility];  Service: Neurosurgery;  Laterality: N/A;  L3-4 L4-5 L5-S1 Laminectomy    Family History  Problem Relation Age of Onset  . Colon cancer Father   . Heart disease Mother        has pacemaker  . Diabetes Brother     Allergies  Allergen Reactions  . Demerol Itching    Current Outpatient Medications on File Prior to Visit  Medication Sig Dispense Refill  . ALPRAZolam (XANAX) 1 MG tablet TAKE 1 TABLET BY MOUTH ONCE DAILY AT BEDTIME AS NEEDED FOR ANXIETY 30 tablet 0  . amLODipine (NORVASC) 5 MG tablet TAKE 1 TABLET (5 MG TOTAL) BY MOUTH DAILY. 90 tablet 3  . Blood Glucose Calibration (ACCU-CHEK INSTANT CONTROL) LIQD 1 each by In Vitro route 2 (two) times a week. 1 each 0  . buPROPion (WELLBUTRIN XL) 150 MG 24 hr tablet TAKE 1 TABLET BY MOUTH DAILY. 90 tablet 1  . GLIPIZIDE XL 5 MG 24 hr tablet TAKE 1 TABLET BY MOUTH ONCE DAILY 90 tablet 3  . glucose blood (FREESTYLE LITE) test strip 1 each by Other route 3 (three) times  daily. Use as instructed 300 each 3  . INVOKANA 300 MG TABS tablet TAKE 1 TABLET BY MOUTH DAILY BEFORE BREAKFAST. 90 tablet 1  . losartan-hydrochlorothiazide (HYZAAR) 100-25 MG tablet TAKE 1 TABLET BY MOUTH DAILY. 90 tablet 3  . metFORMIN (GLUCOPHAGE) 1000 MG tablet TAKE 1 TABLET BY MOUTH ONCE DAILY WITH A MEAL 90 tablet 3  . naproxen (NAPROSYN) 500 MG tablet Take 1 tablet (500 mg total) by mouth 2 (two) times daily with a meal. 60 tablet 0  . omeprazole (PRILOSEC) 40 MG capsule TAKE 1 CAPSULE BY MOUTH DAILY. 90 capsule 1  . pravastatin (PRAVACHOL) 40 MG tablet TAKE 1 TABLET BY MOUTH ONCE DAILY 90 tablet 3   No current facility-administered medications on file prior to visit.     BP 124/82 (BP Location: Left Arm, Patient Position: Sitting, Cuff Size: Large)   Pulse 78   Temp 98.7 F (37.1 C) (Oral)   Wt 245 lb 3.2 oz (111.2 kg)   SpO2 96%   BMI 33.26 kg/m     Review of Systems  Constitutional: Negative for appetite change, chills, fatigue and fever.  HENT: Negative for congestion, dental problem, ear pain, hearing loss, sore throat, tinnitus, trouble swallowing and voice change.   Eyes: Negative for pain, discharge and visual disturbance.  Respiratory: Negative for cough, chest tightness, wheezing and stridor.   Cardiovascular: Negative for chest pain, palpitations and leg swelling.  Gastrointestinal: Negative for abdominal distention, abdominal pain, blood in stool, constipation, diarrhea, nausea and vomiting.  Genitourinary: Negative for difficulty urinating, discharge, flank pain, genital sores, hematuria and urgency.  Musculoskeletal: Positive for back pain. Negative for arthralgias, gait problem, joint swelling, myalgias and neck stiffness.  Skin: Negative for rash.  Neurological: Negative for dizziness, syncope, speech difficulty, weakness, numbness and headaches.  Hematological: Negative for adenopathy. Does not bruise/bleed easily.  Psychiatric/Behavioral: Negative for  behavioral problems and dysphoric mood. The patient is not nervous/anxious.        Objective:   Physical Exam  Constitutional: He appears well-developed and well-nourished.  Weight 245  HENT:  Head: Normocephalic and atraumatic.  Right Ear: External ear normal.  Left Ear: External ear normal.  Nose: Nose normal.  Mouth/Throat: Oropharynx is clear and moist.  Eyes: Pupils are equal, round, and reactive to light. Conjunctivae and EOM are normal. No scleral icterus.  Neck: Normal range of motion. Neck supple. No JVD present. No  thyromegaly present.  Cardiovascular: Regular rhythm, normal heart sounds and intact distal pulses. Exam reveals no gallop and no friction rub.  No murmur heard. Pulmonary/Chest: Effort normal and breath sounds normal. He exhibits no tenderness.  Abdominal: Soft. Bowel sounds are normal. He exhibits no distension and no mass. There is no tenderness.  Genitourinary: Penis normal.  Musculoskeletal: Normal range of motion. He exhibits no edema or tenderness.  Lymphadenopathy:    He has no cervical adenopathy.  Neurological: He is alert. He has normal reflexes. No cranial nerve deficit. Coordination normal.  Skin: Skin is warm and dry. No rash noted.  Psychiatric: He has a normal mood and affect. His behavior is normal.          Assessment & Plan:  Preventive health examination  Diabetes mellitus.  Will review a hemoglobin A1c lipid profile and urine for microalbumin.  Annual diabetic eye examination recommended essential hypertension stable  .  No change in therapy Dyslipidemia continue statin therapy  Nonpharmacologic measures discussed.  Will intensify his efforts at weight loss.  Follow-up 3 months  Gordy Saverseter F Kwiatkowski

## 2017-11-25 NOTE — Patient Instructions (Signed)
Please check your hemoglobin A1c every 3 months  Limit your sodium (Salt) intake    It is important that you exercise regularly, at least 20 minutes 3 to 4 times per week.  If you develop chest pain or shortness of breath seek  medical attention.  You need to lose weight.  Consider a lower calorie diet and regular exercise. 

## 2017-11-26 LAB — CBC WITH DIFFERENTIAL/PLATELET
Basophils Absolute: 0.1 10*3/uL (ref 0.0–0.1)
Basophils Relative: 1.4 % (ref 0.0–3.0)
EOS PCT: 1 % (ref 0.0–5.0)
Eosinophils Absolute: 0 10*3/uL (ref 0.0–0.7)
HEMATOCRIT: 45.1 % (ref 39.0–52.0)
HEMOGLOBIN: 14.5 g/dL (ref 13.0–17.0)
LYMPHS PCT: 48.4 % — AB (ref 12.0–46.0)
Lymphs Abs: 2.4 10*3/uL (ref 0.7–4.0)
MCHC: 32.2 g/dL (ref 30.0–36.0)
MCV: 71.8 fl — ABNORMAL LOW (ref 78.0–100.0)
MONO ABS: 0.3 10*3/uL (ref 0.1–1.0)
Monocytes Relative: 6.5 % (ref 3.0–12.0)
Neutro Abs: 2.1 10*3/uL (ref 1.4–7.7)
Neutrophils Relative %: 42.7 % — ABNORMAL LOW (ref 43.0–77.0)
Platelets: 382 10*3/uL (ref 150.0–400.0)
RBC: 6.29 Mil/uL — ABNORMAL HIGH (ref 4.22–5.81)
RDW: 15.1 % (ref 11.5–15.5)
WBC: 5 10*3/uL (ref 4.0–10.5)

## 2017-11-26 LAB — COMPREHENSIVE METABOLIC PANEL
ALBUMIN: 4.9 g/dL (ref 3.5–5.2)
ALT: 43 U/L (ref 0–53)
AST: 27 U/L (ref 0–37)
Alkaline Phosphatase: 60 U/L (ref 39–117)
BILIRUBIN TOTAL: 0.5 mg/dL (ref 0.2–1.2)
BUN: 14 mg/dL (ref 6–23)
CALCIUM: 10.4 mg/dL (ref 8.4–10.5)
CHLORIDE: 99 meq/L (ref 96–112)
CO2: 29 mEq/L (ref 19–32)
CREATININE: 1.07 mg/dL (ref 0.40–1.50)
GFR: 97.59 mL/min (ref >60.00)
Glucose, Bld: 110 mg/dL — ABNORMAL HIGH (ref 70–99)
Potassium: 4 mEq/L (ref 3.5–5.1)
Sodium: 141 mEq/L (ref 135–145)
Total Protein: 7.7 g/dL (ref 6.0–8.3)

## 2017-11-26 LAB — LIPID PANEL
CHOL/HDL RATIO: 4
CHOLESTEROL: 191 mg/dL (ref 0–200)
HDL: 43.3 mg/dL (ref >39.00)
LDL CALC: 118 mg/dL — AB (ref 0–99)
NonHDL: 148.06
TRIGLYCERIDES: 150 mg/dL — AB (ref 0.0–149.0)
VLDL: 30 mg/dL (ref 0.0–40.0)

## 2017-11-26 LAB — TSH: TSH: 2.09 u[IU]/mL (ref 0.35–4.50)

## 2017-11-26 LAB — MICROALBUMIN / CREATININE URINE RATIO
CREATININE, U: 187.2 mg/dL
MICROALB UR: 9.2 mg/dL — AB (ref 0.0–1.9)
Microalb Creat Ratio: 4.9 mg/g (ref 0.0–30.0)

## 2017-11-26 LAB — HEMOGLOBIN A1C: Hgb A1c MFr Bld: 7.8 % — ABNORMAL HIGH (ref 4.6–6.5)

## 2017-11-26 MED ORDER — ALPRAZOLAM 1 MG PO TABS: ORAL_TABLET | ORAL | 0 refills | Status: DC

## 2017-11-26 NOTE — Telephone Encounter (Signed)
OK for RF 

## 2017-11-26 NOTE — Telephone Encounter (Signed)
Okay to refill? 

## 2017-11-27 ENCOUNTER — Other Ambulatory Visit: Payer: Self-pay | Admitting: Internal Medicine

## 2017-11-27 MED ORDER — ALPRAZOLAM 1 MG PO TABS: ORAL_TABLET | ORAL | 0 refills | Status: DC

## 2017-12-02 ENCOUNTER — Other Ambulatory Visit: Payer: Self-pay

## 2017-12-02 ENCOUNTER — Other Ambulatory Visit: Payer: Self-pay | Admitting: Internal Medicine

## 2017-12-02 MED ORDER — PIOGLITAZONE HCL 30 MG PO TABS: 30.0000 mg | ORAL_TABLET | Freq: Every day | ORAL | 0 refills | Status: DC

## 2017-12-02 MED ORDER — PIOGLITAZONE HCL 30 MG PO TABS: 30.0000 mg | ORAL_TABLET | Freq: Every day | ORAL | 3 refills | Status: DC

## 2018-01-13 ENCOUNTER — Encounter: Payer: Self-pay | Admitting: Internal Medicine

## 2018-01-13 NOTE — Telephone Encounter (Signed)
Ok for placard? Please advise

## 2018-02-09 ENCOUNTER — Other Ambulatory Visit: Payer: Self-pay

## 2018-02-11 MED ORDER — ALPRAZOLAM 1 MG PO TABS: ORAL_TABLET | ORAL | 0 refills | Status: DC

## 2018-02-24 ENCOUNTER — Telehealth: Payer: Self-pay | Admitting: Internal Medicine

## 2018-02-24 NOTE — Telephone Encounter (Signed)
Pt dropped off a [employer] Physical Exam Form to be completed by the provider.  Upon completion pt would like for it to be mailed to [address] [place], [place] [address].  Form was put on providers folder.

## 2018-02-24 NOTE — Telephone Encounter (Signed)
Noted! Please leave form at my desk to be mailed.  Thanks

## 2018-05-04 ENCOUNTER — Other Ambulatory Visit: Payer: Self-pay | Admitting: Internal Medicine

## 2018-05-13 ENCOUNTER — Encounter: Payer: Self-pay | Admitting: Internal Medicine

## 2018-05-13 ENCOUNTER — Ambulatory Visit (INDEPENDENT_AMBULATORY_CARE_PROVIDER_SITE_OTHER): Payer: No Typology Code available for payment source | Admitting: Internal Medicine

## 2018-05-13 VITALS — BP 120/88 | HR 85 | Temp 99.1°F | Wt 237.8 lb

## 2018-05-13 DIAGNOSIS — E119 Type 2 diabetes mellitus without complications: Secondary | ICD-10-CM | POA: Diagnosis not present

## 2018-05-13 DIAGNOSIS — G4700 Insomnia, unspecified: Secondary | ICD-10-CM | POA: Insufficient documentation

## 2018-05-13 DIAGNOSIS — I10 Essential (primary) hypertension: Secondary | ICD-10-CM | POA: Diagnosis not present

## 2018-05-13 DIAGNOSIS — E785 Hyperlipidemia, unspecified: Secondary | ICD-10-CM

## 2018-05-13 LAB — POCT GLYCOSYLATED HEMOGLOBIN (HGB A1C): Hemoglobin A1C: 6 % — AB (ref 4.0–5.6)

## 2018-05-13 MED ORDER — CANAGLIFLOZIN 100 MG PO TABS: 100.0000 mg | ORAL_TABLET | Freq: Every day | ORAL | 2 refills | Status: DC

## 2018-05-13 MED ORDER — ALPRAZOLAM 1 MG PO TABS: ORAL_TABLET | ORAL | 0 refills | Status: DC

## 2018-05-13 MED ORDER — ASPIRIN EC 81 MG PO TBEC: 81.0000 mg | DELAYED_RELEASE_TABLET | Freq: Every day | ORAL | Status: DC

## 2018-05-13 NOTE — Progress Notes (Signed)
Established Patient Office Visit     CC/Reason for Visit: Establish care, medication refills, chronic medical follow-up  HPI: Reginald Kirby. is a 42 y.o. male who is coming in today for the above mentioned reasons.  Due for annual wellness exam in June 2020.  Past Medical History is significant for: Hypertension, diabetes, hyperlipidemia, obesity, insomnia.  He is an Charity fundraiser at [employer], currently in NP school.  He has no acute complaints today.  During his annual physical in June his A1c had increased and he was told to add Actos to his diabetic regimen.  This served as a wake-up call for him and since June he has been combining a low-carb lifestyle with intermittent fasting and has lost about 18 pounds.  He states that his CBGs at home have ranged between 70 and 89.  His long-term goal would be to completely get off diabetic medication.  He also has hypertension that has been well controlled, hyperlipidemia on a statin.  He has depression which has been stable on Wellbutrin for years.  He has also used Xanax intermittently for insomnia.  He has been using it 2-3 times a week.   Past Medical/Surgical History: Past Medical History:  Diagnosis Date  . Anxiety   . Arthritis    DDD  BACK  . Depression   . Diabetes mellitus   . GERD (gastroesophageal reflux disease)   . Headache    HX MIGRAINES   . Hyperlipidemia   . Hypertension   . Sinusitis     Past Surgical History:  Procedure Laterality Date  . CARDIAC CATHETERIZATION     2006    OKAY   . epidural injections   2002  . LUMBAR LAMINECTOMY/DECOMPRESSION MICRODISCECTOMY N/A 10/13/2015   Procedure: L3-4 L4-5 L5-S1 Laminectomy;  Surgeon: Barnett Abu, MD;  Location: [facility];  Service: Neurosurgery;  Laterality: N/A;  L3-4 L4-5 L5-S1 Laminectomy    Social History:  reports that he has never smoked. He has never used smokeless tobacco. He reports that he drinks alcohol. He reports that he does not use  drugs.  Allergies: Allergies  Allergen Reactions  . Demerol Itching    Family History:  Family History  Problem Relation Age of Onset  . Colon cancer Father   . Heart disease Mother        has pacemaker  . Diabetes Brother      Current Outpatient Medications:  .  ALPRAZolam (XANAX) 1 MG tablet, TAKE 1 TABLET BY MOUTH ONCE DAILY AT BEDTIME AS NEEDED FOR insomnia, Disp: 15 tablet, Rfl: 0 .  amLODipine (NORVASC) 5 MG tablet, TAKE 1 TABLET (5 MG TOTAL) BY MOUTH DAILY., Disp: 90 tablet, Rfl: 3 .  aspirin EC 81 MG tablet, Take 1 tablet (81 mg total) by mouth daily., Disp: , Rfl:  .  Blood Glucose Calibration (ACCU-CHEK INSTANT CONTROL) LIQD, 1 each by In Vitro route 2 (two) times a week., Disp: 1 each, Rfl: 0 .  buPROPion (WELLBUTRIN XL) 150 MG 24 hr tablet, TAKE 1 TABLET BY MOUTH DAILY., Disp: 90 tablet, Rfl: 1 .  canagliflozin (INVOKANA) 100 MG TABS tablet, Take 1 tablet (100 mg total) by mouth daily before breakfast., Disp: 30 tablet, Rfl: 2 .  GLIPIZIDE XL 5 MG 24 hr tablet, TAKE 1 TABLET BY MOUTH ONCE DAILY, Disp: 90 tablet, Rfl: 3 .  glucose blood (FREESTYLE LITE) test strip, 1 each by Other route 3 (three) times daily. Use as instructed, Disp: 300 each, Rfl:  3 .  losartan-hydrochlorothiazide (HYZAAR) 100-25 MG tablet, TAKE 1 TABLET BY MOUTH DAILY., Disp: 90 tablet, Rfl: 3 .  metFORMIN (GLUCOPHAGE) 1000 MG tablet, TAKE 1 TABLET BY MOUTH ONCE DAILY WITH A MEAL, Disp: 90 tablet, Rfl: 3 .  omeprazole (PRILOSEC) 40 MG capsule, TAKE 1 CAPSULE BY MOUTH DAILY., Disp: 90 capsule, Rfl: 1 .  pioglitazone (ACTOS) 30 MG tablet, Take 1 tablet (30 mg total) by mouth daily., Disp: 90 tablet, Rfl: 3 .  pravastatin (PRAVACHOL) 40 MG tablet, TAKE 1 TABLET BY MOUTH ONCE DAILY, Disp: 90 tablet, Rfl: 3  Review of Systems:  Constitutional: Denies fever, chills, diaphoresis, appetite change and fatigue.  HEENT: Denies photophobia, eye pain, redness, hearing loss, ear pain, congestion, sore throat,  rhinorrhea, sneezing, mouth sores, trouble swallowing, neck pain, neck stiffness and tinnitus.   Respiratory: Denies SOB, DOE, cough, chest tightness,  and wheezing.   Cardiovascular: Denies chest pain, palpitations and leg swelling.  Gastrointestinal: Denies nausea, vomiting, abdominal pain, diarrhea, constipation, blood in stool and abdominal distention.  Genitourinary: Denies dysuria, urgency, frequency, hematuria, flank pain and difficulty urinating.  Endocrine: Denies: hot or cold intolerance, sweats, changes in hair or nails, polyuria, polydipsia. Musculoskeletal: Denies myalgias, back pain, joint swelling, arthralgias and gait problem.  Skin: Denies pallor, rash and wound.  Neurological: Denies dizziness, seizures, syncope, weakness, light-headedness, numbness and headaches.  Hematological: Denies adenopathy. Easy bruising, personal or family bleeding history  Psychiatric/Behavioral: Denies suicidal ideation, mood changes, confusion, nervousness, sleep disturbance and agitation    Physical Exam: Vitals:   05/13/18 1451  BP: 120/88  Pulse: 85  Temp: 99.1 F (37.3 C)  TempSrc: Oral  SpO2: 98%  Weight: 237 lb 12.8 oz (107.9 kg)    Body mass index is 32.25 kg/m.   Constitutional: NAD, calm, comfortable Eyes: PERRL, lids and conjunctivae normal ENMT: Mucous membranes are moist. Posterior pharynx clear of any exudate or lesions. Normal dentition. Neck: normal, supple, no masses, no thyromegaly Respiratory: clear to auscultation bilaterally, no wheezing, no crackles. Normal respiratory effort. No accessory muscle use.  Cardiovascular: Regular rate and rhythm, no murmurs / rubs / gallops. No extremity edema. 2+ pedal pulses. No carotid bruits.   Musculoskeletal: no clubbing / cyanosis. No joint deformity upper and lower extremities. Good ROM, no contractures. Normal muscle tone.  Skin: no rashes, lesions, ulcers. No induration Neurologic: Grossly intact and nonfocal Psychiatric:  Normal judgment and insight. Alert and oriented x 3. Normal mood.    Impression and Plan:  Diabetes mellitus without complication (HCC)   Lab Results  Component Value Date   HGBA1C 6.0 (A) 05/13/2018   His A1c has decreased to 6.  This is due to his efforts at weight loss and better diabetic management. -As his ultimate goal is to be completely free of diabetic medications, will decrease Invokana dose from 300 to 100 mg daily today.  He is to continue metformin 1000 mg daily and glipizide XL 5 mg daily. -If A1c remains stable at next visit in 3 months, can consider discontinuing Invokana altogether.   Dyslipidemia -LDL was 118 at last visit in June, was started on pravastatin. -Recheck at next visit.  Essential hypertension -Well-controlled, continue current medications -Will start taking 81 mg of aspirin daily.  Insomnia, unspecified type  -We have discussed sleep hygiene techniques. -We have discussed at length my goal to get him completely weaned off of Xanax as this is habit-forming and can create drug tolerance. -He will use melatonin nightly with Benadryl as needed and  Xanax only sparingly. -He states that 30 tablets of Xanax will usually last him 2 to 3 months, will drop him down to 15 tablets today and will continue to discuss this issue at follow-up visit.    Patient Instructions  -It was so nice to see you today!  -Start taking aspirin 81 mg daily.  -Decrease your Invokana dose to 100 mg daily.  -Keep up the great work on losing weight and improved diabetic control.  -Your creatinine has been between 0.96 and 1.07 over the past 2 years.  Will recheck at follow-up visit.  -For your insomnia: Melatonin nightly, Benadryl as needed, try to wean off Xanax.  -Please schedule appointment with me in 3 months for continued diabetic and blood pressure management.   Insomnia Insomnia is a sleep disorder that makes it difficult to fall asleep or to stay asleep. Insomnia  can cause tiredness (fatigue), low energy, difficulty concentrating, mood swings, and poor performance at work or school. There are three different ways to classify insomnia:  Difficulty falling asleep.  Difficulty staying asleep.  Waking up too early in the morning.  Any type of insomnia can be long-term (chronic) or short-term (acute). Both are common. Short-term insomnia usually lasts for three months or less. Chronic insomnia occurs at least three times a week for longer than three months. What are the causes? Insomnia may be caused by another condition, situation, or substance, such as:  Anxiety.  Certain medicines.  Gastroesophageal reflux disease (GERD) or other gastrointestinal conditions.  Asthma or other breathing conditions.  Restless legs syndrome, sleep apnea, or other sleep disorders.  Chronic pain.  Menopause. This may include hot flashes.  Stroke.  Abuse of alcohol, tobacco, or illegal drugs.  Depression.  Caffeine.  Neurological disorders, such as Alzheimer disease.  An overactive thyroid (hyperthyroidism).  The cause of insomnia may not be known. What increases the risk? Risk factors for insomnia include:  Gender. Women are more commonly affected than men.  Age. Insomnia is more common as you get older.  Stress. This may involve your professional or personal life.  Income. Insomnia is more common in people with lower income.  Lack of exercise.  Irregular work schedule or night shifts.  Traveling between different time zones.  What are the signs or symptoms? If you have insomnia, trouble falling asleep or trouble staying asleep is the main symptom. This may lead to other symptoms, such as:  Feeling fatigued.  Feeling nervous about going to sleep.  Not feeling rested in the morning.  Having trouble concentrating.  Feeling irritable, anxious, or depressed.  How is this treated? Treatment for insomnia depends on the cause. If your  insomnia is caused by an underlying condition, treatment will focus on addressing the condition. Treatment may also include:  Medicines to help you sleep.  Counseling or therapy.  Lifestyle adjustments.  Follow these instructions at home:  Take medicines only as directed by your health care provider.  Keep regular sleeping and waking hours. Avoid naps.  Keep a sleep diary to help you and your health care provider figure out what could be causing your insomnia. Include: ? When you sleep. ? When you wake up during the night. ? How well you sleep. ? How rested you feel the next day. ? Any side effects of medicines you are taking. ? What you eat and drink.  Make your bedroom a comfortable place where it is easy to fall asleep: ? Put up shades or special blackout curtains to  block light from outside. ? Use a white noise machine to block noise. ? Keep the temperature cool.  Exercise regularly as directed by your health care provider. Avoid exercising right before bedtime.  Use relaxation techniques to manage stress. Ask your health care provider to suggest some techniques that may work well for you. These may include: ? Breathing exercises. ? Routines to release muscle tension. ? Visualizing peaceful scenes.  Cut back on alcohol, caffeinated beverages, and cigarettes, especially close to bedtime. These can disrupt your sleep.  Do not overeat or eat spicy foods right before bedtime. This can lead to digestive discomfort that can make it hard for you to sleep.  Limit screen use before bedtime. This includes: ? Watching TV. ? Using your smartphone, tablet, and computer.  Stick to a routine. This can help you fall asleep faster. Try to do a quiet activity, brush your teeth, and go to bed at the same time each night.  Get out of bed if you are still awake after 15 minutes of trying to sleep. Keep the lights down, but try reading or doing a quiet activity. When you feel sleepy, go  back to bed.  Make sure that you drive carefully. Avoid driving if you feel very sleepy.  Keep all follow-up appointments as directed by your health care provider. This is important. Contact a health care provider if:  You are tired throughout the day or have trouble in your daily routine due to sleepiness.  You continue to have sleep problems or your sleep problems get worse. Get help right away if:  You have serious thoughts about hurting yourself or someone else. This information is not intended to replace advice given to you by your health care provider. Make sure you discuss any questions you have with your health care provider. Document Released: 05/31/2000 Document Revised: 11/03/2015 Document Reviewed: 03/04/2014 Elsevier Interactive Patient Education  2018 [employer].      Chaya Jan, MD [place] [facility]

## 2018-05-13 NOTE — Patient Instructions (Addendum)
-It was so nice to see you today!  -Start taking aspirin 81 mg daily.  -Decrease your Invokana dose to 100 mg daily.  -Keep up the great work on losing weight and improved diabetic control.  -Your creatinine has been between 0.96 and 1.07 over the past 2 years.  Will recheck at follow-up visit.  -For your insomnia: Melatonin nightly, Benadryl as needed, try to wean off Xanax.  -Please schedule appointment with me in 3 months for continued diabetic and blood pressure management.   Insomnia Insomnia is a sleep disorder that makes it difficult to fall asleep or to stay asleep. Insomnia can cause tiredness (fatigue), low energy, difficulty concentrating, mood swings, and poor performance at work or school. There are three different ways to classify insomnia:  Difficulty falling asleep.  Difficulty staying asleep.  Waking up too early in the morning.  Any type of insomnia can be long-term (chronic) or short-term (acute). Both are common. Short-term insomnia usually lasts for three months or less. Chronic insomnia occurs at least three times a week for longer than three months. What are the causes? Insomnia may be caused by another condition, situation, or substance, such as:  Anxiety.  Certain medicines.  Gastroesophageal reflux disease (GERD) or other gastrointestinal conditions.  Asthma or other breathing conditions.  Restless legs syndrome, sleep apnea, or other sleep disorders.  Chronic pain.  Menopause. This may include hot flashes.  Stroke.  Abuse of alcohol, tobacco, or illegal drugs.  Depression.  Caffeine.  Neurological disorders, such as Alzheimer disease.  An overactive thyroid (hyperthyroidism).  The cause of insomnia may not be known. What increases the risk? Risk factors for insomnia include:  Gender. Women are more commonly affected than men.  Age. Insomnia is more common as you get older.  Stress. This may involve your professional or personal  life.  Income. Insomnia is more common in people with lower income.  Lack of exercise.  Irregular work schedule or night shifts.  Traveling between different time zones.  What are the signs or symptoms? If you have insomnia, trouble falling asleep or trouble staying asleep is the main symptom. This may lead to other symptoms, such as:  Feeling fatigued.  Feeling nervous about going to sleep.  Not feeling rested in the morning.  Having trouble concentrating.  Feeling irritable, anxious, or depressed.  How is this treated? Treatment for insomnia depends on the cause. If your insomnia is caused by an underlying condition, treatment will focus on addressing the condition. Treatment may also include:  Medicines to help you sleep.  Counseling or therapy.  Lifestyle adjustments.  Follow these instructions at home:  Take medicines only as directed by your health care provider.  Keep regular sleeping and waking hours. Avoid naps.  Keep a sleep diary to help you and your health care provider figure out what could be causing your insomnia. Include: ? When you sleep. ? When you wake up during the night. ? How well you sleep. ? How rested you feel the next day. ? Any side effects of medicines you are taking. ? What you eat and drink.  Make your bedroom a comfortable place where it is easy to fall asleep: ? Put up shades or special blackout curtains to block light from outside. ? Use a white noise machine to block noise. ? Keep the temperature cool.  Exercise regularly as directed by your health care provider. Avoid exercising right before bedtime.  Use relaxation techniques to manage stress. Ask your health  care provider to suggest some techniques that may work well for you. These may include: ? Breathing exercises. ? Routines to release muscle tension. ? Visualizing peaceful scenes.  Cut back on alcohol, caffeinated beverages, and cigarettes, especially close to bedtime.  These can disrupt your sleep.  Do not overeat or eat spicy foods right before bedtime. This can lead to digestive discomfort that can make it hard for you to sleep.  Limit screen use before bedtime. This includes: ? Watching TV. ? Using your smartphone, tablet, and computer.  Stick to a routine. This can help you fall asleep faster. Try to do a quiet activity, brush your teeth, and go to bed at the same time each night.  Get out of bed if you are still awake after 15 minutes of trying to sleep. Keep the lights down, but try reading or doing a quiet activity. When you feel sleepy, go back to bed.  Make sure that you drive carefully. Avoid driving if you feel very sleepy.  Keep all follow-up appointments as directed by your health care provider. This is important. Contact a health care provider if:  You are tired throughout the day or have trouble in your daily routine due to sleepiness.  You continue to have sleep problems or your sleep problems get worse. Get help right away if:  You have serious thoughts about hurting yourself or someone else. This information is not intended to replace advice given to you by your health care provider. Make sure you discuss any questions you have with your health care provider. Document Released: 05/31/2000 Document Revised: 11/03/2015 Document Reviewed: 03/04/2014 Elsevier Interactive Patient Education  [employer]2018 [employer]

## 2018-06-23 ENCOUNTER — Telehealth: Payer: Self-pay | Admitting: Internal Medicine

## 2018-06-23 NOTE — Telephone Encounter (Signed)
FMLA forms to be filled out, placed in dr's folder.  Fax to [phone] upon completion

## 2018-06-24 ENCOUNTER — Encounter: Payer: Self-pay | Admitting: *Deleted

## 2018-06-24 NOTE — Telephone Encounter (Signed)
Patient will need an office visit for forms to be filled out.   Message sent through [facility]

## 2018-07-03 ENCOUNTER — Ambulatory Visit (INDEPENDENT_AMBULATORY_CARE_PROVIDER_SITE_OTHER): Payer: No Typology Code available for payment source | Admitting: Internal Medicine

## 2018-07-03 ENCOUNTER — Encounter: Payer: Self-pay | Admitting: Internal Medicine

## 2018-07-03 VITALS — BP 130/82 | HR 78 | Temp 98.3°F | Wt 247.3 lb

## 2018-07-03 DIAGNOSIS — M5416 Radiculopathy, lumbar region: Secondary | ICD-10-CM

## 2018-07-03 NOTE — Progress Notes (Signed)
No charge visit.  Patient seen to fill FMLA paperwork.

## 2018-07-05 ENCOUNTER — Telehealth: Payer: No Typology Code available for payment source | Admitting: Family

## 2018-07-05 DIAGNOSIS — J111 Influenza due to unidentified influenza virus with other respiratory manifestations: Secondary | ICD-10-CM | POA: Diagnosis not present

## 2018-07-05 MED ORDER — OSELTAMIVIR PHOSPHATE 75 MG PO CAPS: 75.0000 mg | ORAL_CAPSULE | Freq: Two times a day (BID) | ORAL | 0 refills | Status: DC

## 2018-07-05 NOTE — Progress Notes (Signed)
Thank you for the details you included in the comment boxes. Those details are very helpful in determining the best course of treatment for you and help [place] to provide the best care. Given your work at [employer] and your higher exposure to flu, see plan below.  E visit for Flu like symptoms   We are sorry that you are not feeling well.  Here is how we plan to help! Based on what you have shared with me it looks like you may have a respiratory virus that may be influenza.  Influenza or "the flu" is   an infection caused by a respiratory virus. The flu virus is highly contagious and persons who did not receive their yearly flu vaccination may "catch" the flu from close contact.  We have anti-viral medications to treat the viruses that cause this infection. They are not a "cure" and only shorten the course of the infection. These prescriptions are most effective when they are given within the first 2 days of "flu" symptoms. Antiviral medication are indicated if you have a high risk of complications from the flu. You should  also consider an antiviral medication if you are in close contact with someone who is at risk. These medications can help patients avoid complications from the flu  but have side effects that you should know. Possible side effects from Tamiflu or oseltamivir include nausea, vomiting, diarrhea, dizziness, headaches, eye redness, sleep problems or other respiratory symptoms. You should not take Tamiflu if you have an allergy to oseltamivir or any to the ingredients in Tamiflu.  Based upon your symptoms and potential risk factors I have prescribed Oseltamivir (Tamiflu).  It has been sent to your designated pharmacy.  You will take one 75 mg capsule orally twice a day for the next 5 days.  ANYONE WHO HAS FLU SYMPTOMS SHOULD: . Stay home. The flu is highly contagious and going out or to work exposes others! . Be sure to drink plenty of fluids. Water is fine as well as fruit juices, sodas and  electrolyte beverages. You may want to stay away from caffeine or alcohol. If you are nauseated, try taking small sips of liquids. How do you know if you are getting enough fluid? Your urine should be a pale yellow or almost colorless. . Get rest. . Taking a steamy shower or using a humidifier may help nasal congestion and ease sore throat pain. Using a saline nasal spray works much the same way. . Cough drops, hard candies and sore throat lozenges may ease your cough. . Line up a caregiver. Have someone check on you regularly.   GET HELP RIGHT AWAY IF: . You cannot keep down liquids or your medications. . You become short of breath . Your fell like you are going to pass out or loose consciousness. . Your symptoms persist after you have completed your treatment plan MAKE SURE YOU   Understand these instructions.  Will watch your condition.  Will get help right away if you are not doing well or get worse.  Your e-visit answers were reviewed by a board certified advanced clinical practitioner to complete your personal care plan.  Depending on the condition, your plan could have included both over the counter or prescription medications.  If there is a problem please reply  once you have received a response from your provider.  Your safety is important to [place].  If you have drug allergies check your prescription carefully.    You can use MyChart  to ask questions about today's visit, request a non-urgent call back, or ask for a work or school excuse for 24 hours related to this e-Visit. If it has been greater than 24 hours you will need to follow up with your provider, or enter a new e-Visit to address those concerns.  You will get an e-mail in the next two days asking about your experience.  I hope that your e-visit has been valuable and will speed your recovery. Thank you for using e-visits.

## 2018-08-03 ENCOUNTER — Other Ambulatory Visit: Payer: Self-pay | Admitting: Internal Medicine

## 2018-08-06 ENCOUNTER — Encounter: Payer: Self-pay | Admitting: Internal Medicine

## 2018-08-07 ENCOUNTER — Other Ambulatory Visit: Payer: Self-pay | Admitting: Internal Medicine

## 2018-08-07 MED ORDER — GLIPIZIDE ER 5 MG PO TB24: 5.0000 mg | ORAL_TABLET | Freq: Every day | ORAL | 1 refills | Status: DC

## 2018-08-07 MED ORDER — LOSARTAN POTASSIUM-HCTZ 100-25 MG PO TABS: 1.0000 | ORAL_TABLET | Freq: Every day | ORAL | 1 refills | Status: DC

## 2018-08-07 MED ORDER — METFORMIN HCL 1000 MG PO TABS: 1000.0000 mg | ORAL_TABLET | Freq: Every day | ORAL | 1 refills | Status: DC

## 2018-08-07 NOTE — Telephone Encounter (Signed)
Copied from CRM [id number]. Topic: Quick Communication - Rx Refill/Question >> Aug 07, 2018 12:36 PM Angela Nevin wrote: Medication: metFORMIN (GLUCOPHAGE) 1000 MG tablet, losartan-hydrochlorothiazide (HYZAAR) 100-25 MG tablet  and GLIPIZIDE XL 5 MG 24 hr tablet   Patient is requesting refill of these medications.   Preferred Pharmacy (with phone number or street name):[facility] - [place], [place] - [address].  [phone] (Phone) [phone] (Fax)

## 2018-08-07 NOTE — Telephone Encounter (Signed)
Requested medication (s) are due for refill today: Yes  Requested medication (s) are on the active medication list: Yes  Last refill:  08/05/17  Future visit scheduled: No  Notes to clinic:  Expired Rx, unable to refill     Requested Prescriptions  Pending Prescriptions Disp Refills   glipiZIDE (GLIPIZIDE XL) 5 MG 24 hr tablet 90 tablet 3    Sig: Take 1 tablet (5 mg total) by mouth daily.     Endocrinology:  Diabetes - Sulfonylureas Passed - 08/07/2018  1:16 PM      Passed - HBA1C is between 0 and 7.9 and within 180 days    Hemoglobin A1C  Date Value Ref Range Status  05/13/2018 6.0 (A) 4.0 - 5.6 % Final   Hgb A1c MFr Bld  Date Value Ref Range Status  11/25/2017 7.8 (H) 4.6 - 6.5 % Final    Comment:    Glycemic Control Guidelines for People with Diabetes:Non Diabetic:  <6%Goal of Therapy: <7%Additional Action Suggested:  >8%          Passed - Valid encounter within last 6 months    Recent Outpatient Visits          1 month ago Lumbar radiculopathy   Therapist, music at [employer], Rayford Halsted, MD   2 months ago Diabetes mellitus without complication [facility])   [place] at [facility], Rayford Halsted, MD   8 months ago Encounter for preventive health examination   Therapist, music at [place], Doretha Sou, MD   9 months ago Upper back pain on right side   Therapist, music at [facility], Doretha Sou, MD   10 months ago Essential hypertension   Therapist, music at [facility] [facility], Doretha Sou, MD            losartan-hydrochlorothiazide (HYZAAR) 100-25 MG tablet 90 tablet 3    Sig: Take 1 tablet by mouth daily.     Cardiovascular: ARB + Diuretic Combos Failed - 08/07/2018  1:16 PM      Failed - K in normal range and within 180 days    Potassium  Date Value Ref Range Status  11/25/2017 4.0 3.5 - 5.1 mEq/L Final         Failed - Na in normal range and within 180 days    Sodium  Date Value Ref Range  Status  11/25/2017 141 135 - 145 mEq/L Final         Failed - Cr in normal range and within 180 days    Creatinine, Ser  Date Value Ref Range Status  11/25/2017 1.07 0.40 - 1.50 mg/dL Final         Failed - Ca in normal range and within 180 days    Calcium  Date Value Ref Range Status  11/25/2017 10.4 8.4 - 10.5 mg/dL Final         Passed - Patient is not pregnant      Passed - Last BP in normal range    BP Readings from Last 1 Encounters:  07/03/18 130/82         Passed - Valid encounter within last 6 months    Recent Outpatient Visits          1 month ago Lumbar radiculopathy   Therapist, music at [employer], Rayford Halsted, MD   2 months ago Diabetes mellitus without complication [facility])   [place] at [facility], Rayford Halsted, MD   8 months ago Encounter  for preventive health examination   Therapist, music at [place], Doretha Sou, MD   9 months ago Upper back pain on right side   Therapist, music at [employer] [facility], Doretha Sou, MD   10 months ago Essential hypertension   Therapist, music at [employer], Doretha Sou, MD            metFORMIN (GLUCOPHAGE) 1000 MG tablet 90 tablet 3    Sig: TAKE 1 TABLET BY MOUTH ONCE DAILY WITH A MEAL     Endocrinology:  Diabetes - Biguanides Passed - 08/07/2018  1:16 PM      Passed - Cr in normal range and within 360 days    Creatinine, Ser  Date Value Ref Range Status  11/25/2017 1.07 0.40 - 1.50 mg/dL Final         Passed - HBA1C is between 0 and 7.9 and within 180 days    Hemoglobin A1C  Date Value Ref Range Status  05/13/2018 6.0 (A) 4.0 - 5.6 % Final   Hgb A1c MFr Bld  Date Value Ref Range Status  11/25/2017 7.8 (H) 4.6 - 6.5 % Final    Comment:    Glycemic Control Guidelines for People with Diabetes:Non Diabetic:  <6%Goal of Therapy: <7%Additional Action Suggested:  >8%          Passed - eGFR in normal range and within 360 days    GFR calc Af Amer   Date Value Ref Range Status  10/05/2015 >60 >60 mL/min Final    Comment:    (NOTE) The eGFR has been calculated using the CKD EPI equation. This calculation has not been validated in all clinical situations. eGFR's persistently <60 mL/min signify possible Chronic Kidney Disease.    GFR calc non Af Amer  Date Value Ref Range Status  10/05/2015 >60 >60 mL/min Final   GFR  Date Value Ref Range Status  11/25/2017 97.59 >60.00 mL/min Final         Passed - Valid encounter within last 6 months    Recent Outpatient Visits          1 month ago Lumbar radiculopathy   [place] [facility] [place], [place]   2 months ago Diabetes mellitus without complication [facility])   [place] at [facility], Rayford Halsted, MD   8 months ago Encounter for preventive health examination   Therapist, music at [place], Doretha Sou, MD   9 months ago Upper back pain on right side   Therapist, music at [facility] [facility], Doretha Sou, MD   10 months ago Essential hypertension   Therapist, music at [facility], Doretha Sou, MD

## 2018-09-14 ENCOUNTER — Encounter: Payer: Self-pay | Admitting: Internal Medicine

## 2018-09-15 ENCOUNTER — Ambulatory Visit (INDEPENDENT_AMBULATORY_CARE_PROVIDER_SITE_OTHER): Payer: No Typology Code available for payment source | Admitting: Internal Medicine

## 2018-09-15 ENCOUNTER — Other Ambulatory Visit: Payer: Self-pay

## 2018-09-15 DIAGNOSIS — M109 Gout, unspecified: Secondary | ICD-10-CM | POA: Diagnosis not present

## 2018-09-15 MED ORDER — COLCHICINE 0.6 MG PO TABS: 0.6000 mg | ORAL_TABLET | Freq: Two times a day (BID) | ORAL | 0 refills | Status: DC | PRN

## 2018-09-15 NOTE — Progress Notes (Signed)
Virtual Visit via Video Note  I connected with Reginald Kirby. on 09/15/18 at 11:30 AM EDT by a video enabled telemedicine application and verified that I am speaking with the correct person using two identifiers.  Location patient: home Location provider: work office Persons participating in the virtual visit: patient, provider  I discussed the limitations of evaluation and management by telemedicine and the availability of in person appointments. The patient expressed understanding and agreed to proceed.   HPI: He started having left big toe pain on Friday night after mowing the yard (no injuries). By "Sunday couldn't walk. Took some naproxen which provided some relief. Dad and brother have gout, he has never had gout. He is an RN at [employer]. Wonders if colchicine might be helpful.   ROS: Constitutional: Denies fever, chills, diaphoresis, appetite change and fatigue.  HEENT: Denies photophobia, eye pain, redness, hearing loss, ear pain, congestion, sore throat, rhinorrhea, sneezing, mouth sores, trouble swallowing, neck pain, neck stiffness and tinnitus.   Respiratory: Denies SOB, DOE, cough, chest tightness,  and wheezing.   Cardiovascular: Denies chest pain, palpitations and leg swelling.  Gastrointestinal: Denies nausea, vomiting, abdominal pain, diarrhea, constipation, blood in stool and abdominal distention.  Genitourinary: Denies dysuria, urgency, frequency, hematuria, flank pain and difficulty urinating.  Endocrine: Denies: hot or cold intolerance, sweats, changes in hair or nails, polyuria, polydipsia. Musculoskeletal: Denies myalgias, back pain, arthralgias and gait problem.  Skin: Denies pallor, rash and wound.  Neurological: Denies dizziness, seizures, syncope, weakness, light-headedness, numbness and headaches.  Hematological: Denies adenopathy. Easy bruising, personal or family bleeding history  Psychiatric/Behavioral: Denies suicidal ideation, mood changes, confusion,  nervousness, sleep disturbance and agitation   Past Medical History:  Diagnosis Date  . Anxiety   . Arthritis    DDD  BACK  . Depression   . Diabetes mellitus   . GERD (gastroesophageal reflux disease)   . Headache    HX MIGRAINES   . Hyperlipidemia   . Hypertension   . Sinusitis     Past Surgical History:  Procedure Laterality Date  . CARDIAC CATHETERIZATION     20" 06    OKAY   . epidural injections   2002  . LUMBAR LAMINECTOMY/DECOMPRESSION MICRODISCECTOMY N/A 10/13/2015   Procedure: L3-4 L4-5 L5-S1 Laminectomy;  Surgeon: Barnett Abu, MD;  Location: [facility];  Service: Neurosurgery;  Laterality: N/A;  L3-4 L4-5 L5-S1 Laminectomy    Family History  Problem Relation Age of Onset  . Colon cancer Father   . Heart disease Mother        has pacemaker  . Diabetes Brother     SOCIAL HX:   reports that he has never smoked. He has never used smokeless tobacco. He reports current alcohol use. He reports that he does not use drugs.   Current Outpatient Medications:  .  ALPRAZolam (XANAX) 1 MG tablet, TAKE 1 TABLET BY MOUTH ONCE DAILY AT BEDTIME AS NEEDED FOR insomnia, Disp: 15 tablet, Rfl: 0 .  amLODipine (NORVASC) 5 MG tablet, TAKE 1 TABLET (5 MG TOTAL) BY MOUTH DAILY., Disp: 90 tablet, Rfl: 3 .  aspirin EC 81 MG tablet, Take 1 tablet (81 mg total) by mouth daily., Disp: , Rfl:  .  buPROPion (WELLBUTRIN XL) 150 MG 24 hr tablet, TAKE 1 TABLET BY MOUTH DAILY., Disp: 90 tablet, Rfl: 1 .  canagliflozin (INVOKANA) 100 MG TABS tablet, Take 1 tablet (100 mg total) by mouth daily before breakfast., Disp: 30 tablet, Rfl: 2 .  colchicine  0.6 MG tablet, Take 1 tablet (0.6 mg total) by mouth 2 (two) times daily as needed., Disp: 60 tablet, Rfl: 0 .  glipiZIDE (GLIPIZIDE XL) 5 MG 24 hr tablet, Take 1 tablet (5 mg total) by mouth daily., Disp: 90 tablet, Rfl: 1 .  losartan-hydrochlorothiazide (HYZAAR) 100-25 MG tablet, Take 1 tablet by mouth daily., Disp: 90 tablet, Rfl: 1 .  metFORMIN  (GLUCOPHAGE) 1000 MG tablet, Take 1 tablet (1,000 mg total) by mouth daily., Disp: 90 tablet, Rfl: 1 .  omeprazole (PRILOSEC) 40 MG capsule, TAKE 1 CAPSULE BY MOUTH DAILY., Disp: 90 capsule, Rfl: 1 .  oseltamivir (TAMIFLU) 75 MG capsule, Take 1 capsule (75 mg total) by mouth 2 (two) times daily., Disp: 10 capsule, Rfl: 0 .  pravastatin (PRAVACHOL) 40 MG tablet, TAKE 1 TABLET BY MOUTH ONCE DAILY, Disp: 90 tablet, Rfl: 3  EXAM:   VITALS per patient if applicable: none reported  GENERAL: alert, oriented, appears well and in no acute distress  HEENT: atraumatic, conjunttiva clear, no obvious abnormalities on inspection of external nose and ears  NECK: normal movements of the head and neck  LUNGS: on inspection no signs of respiratory distress, breathing rate appears normal, no obvious gross increased work of breathing, gasping or wheezing  CV: no obvious cyanosis  MS: left great toe is edematous and erythematous as compared to the right.  PSYCH/NEURO: pleasant and cooperative, no obvious depression or anxiety, speech and thought processing grossly intact  ASSESSMENT AND PLAN:   Acute gout involving toe of left foot, unspecified cause  -Colchicine 0.6 mg BID PRN. -He is advised to notify us if no improvement.    I discussed the assessment and treatment plan with the patient. The patient was provided an opportunity to ask questions and all were answered. The patient agreed with the plan and demonstrated an understanding of the instructions.   The patient was advised to call back or seek an in-person evaluation if the symptoms worsen or if the condition fails to improve as anticipated.    Chaya Jan, MD  [facility] at [facility]

## 2018-10-26 ENCOUNTER — Encounter: Payer: Self-pay | Admitting: Internal Medicine

## 2018-10-26 ENCOUNTER — Other Ambulatory Visit: Payer: Self-pay | Admitting: Internal Medicine

## 2018-10-26 DIAGNOSIS — E119 Type 2 diabetes mellitus without complications: Secondary | ICD-10-CM

## 2018-10-26 MED ORDER — BUPROPION HCL ER (XL) 150 MG PO TB24: 150.0000 mg | ORAL_TABLET | Freq: Every day | ORAL | 1 refills | Status: DC

## 2018-10-26 MED ORDER — OMEPRAZOLE 40 MG PO CPDR: 40.0000 mg | DELAYED_RELEASE_CAPSULE | Freq: Every day | ORAL | 1 refills | Status: DC

## 2018-10-26 MED ORDER — CANAGLIFLOZIN 100 MG PO TABS: ORAL_TABLET | ORAL | 1 refills | Status: DC

## 2018-10-26 MED ORDER — AMLODIPINE BESYLATE 5 MG PO TABS: 5.0000 mg | ORAL_TABLET | Freq: Every day | ORAL | 1 refills | Status: DC

## 2018-11-28 ENCOUNTER — Encounter: Payer: Self-pay | Admitting: Family Medicine

## 2018-11-28 ENCOUNTER — Other Ambulatory Visit: Payer: Self-pay

## 2018-11-28 ENCOUNTER — Ambulatory Visit (INDEPENDENT_AMBULATORY_CARE_PROVIDER_SITE_OTHER): Payer: No Typology Code available for payment source | Admitting: Family Medicine

## 2018-11-28 DIAGNOSIS — L03221 Cellulitis of neck: Secondary | ICD-10-CM

## 2018-11-28 DIAGNOSIS — L729 Follicular cyst of the skin and subcutaneous tissue, unspecified: Secondary | ICD-10-CM

## 2018-11-28 MED ORDER — SULFAMETHOXAZOLE-TRIMETHOPRIM 800-160 MG PO TABS: 1.0000 | ORAL_TABLET | Freq: Two times a day (BID) | ORAL | 0 refills | Status: AC

## 2018-11-28 NOTE — Progress Notes (Signed)
Chief Complaint  Patient presents with  . Cyst    Pt has cyst on neck red and painful    Reginald Kirby. is a 43 y.o. male here for a skin complaint. Due to COVID-19 pandemic, we are interacting via web portal for an electronic face-to-face visit. I verified patient's ID using 2 identifiers. Patient agreed to proceed with visit via this method. Patient is at home, I am at office. Patient, his wife and I are present for visit.   Duration: 4 days Location: neck Pruritic? No Painful? Yes Drainage? No New soaps/lotions/topicals/detergents? No Other associated symptoms: no fevers, +redness and warmth Therapies tried thus far: none +hx of seb cyst over area, needed I&D in past.  ROS:  Const: No fevers Skin: As noted in HPI  Past Medical History:  Diagnosis Date  . Anxiety   . Arthritis    DDD  BACK  . Depression   . Diabetes mellitus   . GERD (gastroesophageal reflux disease)   . Headache    HX MIGRAINES   . Hyperlipidemia   . Hypertension   . Sinusitis    Exam No conversational dyspnea Age appropriate judgment and insight Nml affect and mood There is a circular and raised lesion on the posterior-lateral L side of neck. There is hyperpigmentation; small and central pustule; I asked him to push down and tell me what he felt (pt is a nurse) and he noted it was hard on outside and gooey at the very top  Cyst of skin - Plan: sulfamethoxazole-trimethoprim (BACTRIM DS) 800-160 MG tablet, BID for 7 d, tx for overlying cellulitis. May need I&D, call Mon PM or Tues AM if not improving. I can do it if nobody at his regular PCP's office does. Ice, compresses, Tylenol, ibuprofen.   F/u prn. The patient voiced understanding and agreement to the plan.  [place], DO 11/28/18 11:04 AM

## 2018-12-01 ENCOUNTER — Encounter: Payer: Self-pay | Admitting: Internal Medicine

## 2018-12-01 ENCOUNTER — Encounter: Payer: Self-pay | Admitting: Family Medicine

## 2018-12-02 ENCOUNTER — Ambulatory Visit (INDEPENDENT_AMBULATORY_CARE_PROVIDER_SITE_OTHER): Payer: No Typology Code available for payment source | Admitting: Family Medicine

## 2018-12-02 ENCOUNTER — Encounter: Payer: Self-pay | Admitting: Family Medicine

## 2018-12-02 ENCOUNTER — Other Ambulatory Visit: Payer: Self-pay

## 2018-12-02 DIAGNOSIS — L0291 Cutaneous abscess, unspecified: Secondary | ICD-10-CM

## 2018-12-02 NOTE — Progress Notes (Signed)
CC: I&D  Seen 4 d ago for suspected infected cyst. Abx did not help, gotten bigger and redder.  No fevers  Exam Psych: Age appropriate judgment and insight Skin: Postero-lateral L neck, there is a raised and circular lesion with overlying erythema and fluctuance, +TTP, diameter approx 3.5 cm  Procedure note; incision and drainage Informed consent obtained. The area was cleaned with alcohol. The area was anesthetized with 4.5 mL of 1% lidocaine with epinephrine. Once adequate anesthesia was obtained, a small linear incision was made with 11 blade scalpel. Approximately 13 mL of purulent material with blood was expressed. Cultures were taken. Loculations were interrupted with a curved hemostat. The area was packed with approximately 7 cm of 0.5 in iodoform gauze. The area was then dressed with gauze. There were no complications noted. The patient tolerated the procedure well.  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 Warning signs and symptoms verbalized and written down in AVS.  F/u w me prn. Pt voiced understanding and agreement to the plan.  Reginald Kirby 8:12 AM 12/02/18

## 2018-12-02 NOTE — Patient Instructions (Signed)
Do not shower for the rest of the day. When you do wash it, use only soap and water. Do not vigorously scrub.  Keep the area clean and dry.   Things to look out for: increasing pain not relieved by ibuprofen/acetaminophen, fevers, spreading redness, drainage of increasing amounts pus, or foul odor.  Finish antibiotic.  Let us know if you need anything.

## 2018-12-17 MED ORDER — PRAVASTATIN SODIUM 40 MG PO TABS: 40.0000 mg | ORAL_TABLET | Freq: Every day | ORAL | 1 refills | Status: DC

## 2019-02-02 ENCOUNTER — Other Ambulatory Visit: Payer: Self-pay | Admitting: *Deleted

## 2019-02-02 MED ORDER — LOSARTAN POTASSIUM-HCTZ 100-25 MG PO TABS: 1.0000 | ORAL_TABLET | Freq: Every day | ORAL | 1 refills | Status: DC

## 2019-02-02 MED ORDER — GLIPIZIDE ER 5 MG PO TB24: 5.0000 mg | ORAL_TABLET | Freq: Every day | ORAL | 1 refills | Status: DC

## 2019-02-02 MED ORDER — METFORMIN HCL 1000 MG PO TABS: 1000.0000 mg | ORAL_TABLET | Freq: Every day | ORAL | 1 refills | Status: DC

## 2019-02-04 ENCOUNTER — Encounter: Payer: Self-pay | Admitting: Internal Medicine

## 2019-02-05 MED ORDER — LOSARTAN POTASSIUM 100 MG PO TABS: 100.0000 mg | ORAL_TABLET | Freq: Every day | ORAL | 1 refills | Status: DC

## 2019-02-05 MED ORDER — HYDROCHLOROTHIAZIDE 25 MG PO TABS: 25.0000 mg | ORAL_TABLET | Freq: Every day | ORAL | 1 refills | Status: DC

## 2019-04-22 ENCOUNTER — Encounter: Payer: No Typology Code available for payment source | Admitting: Internal Medicine

## 2019-05-03 ENCOUNTER — Other Ambulatory Visit: Payer: Self-pay | Admitting: Internal Medicine

## 2019-05-03 DIAGNOSIS — E119 Type 2 diabetes mellitus without complications: Secondary | ICD-10-CM

## 2019-05-16 IMAGING — CR DG ANKLE COMPLETE 3+V*L*
3 series · 3 of 3 positions shown · non-contrast
Comparison: None.

CLINICAL DATA: Lateral ankle pain after fall last night.

EXAM:
LEFT FOOT - COMPLETE 3+ VIEW; LEFT ANKLE COMPLETE - 3+ VIEW

[x ankle ap left]
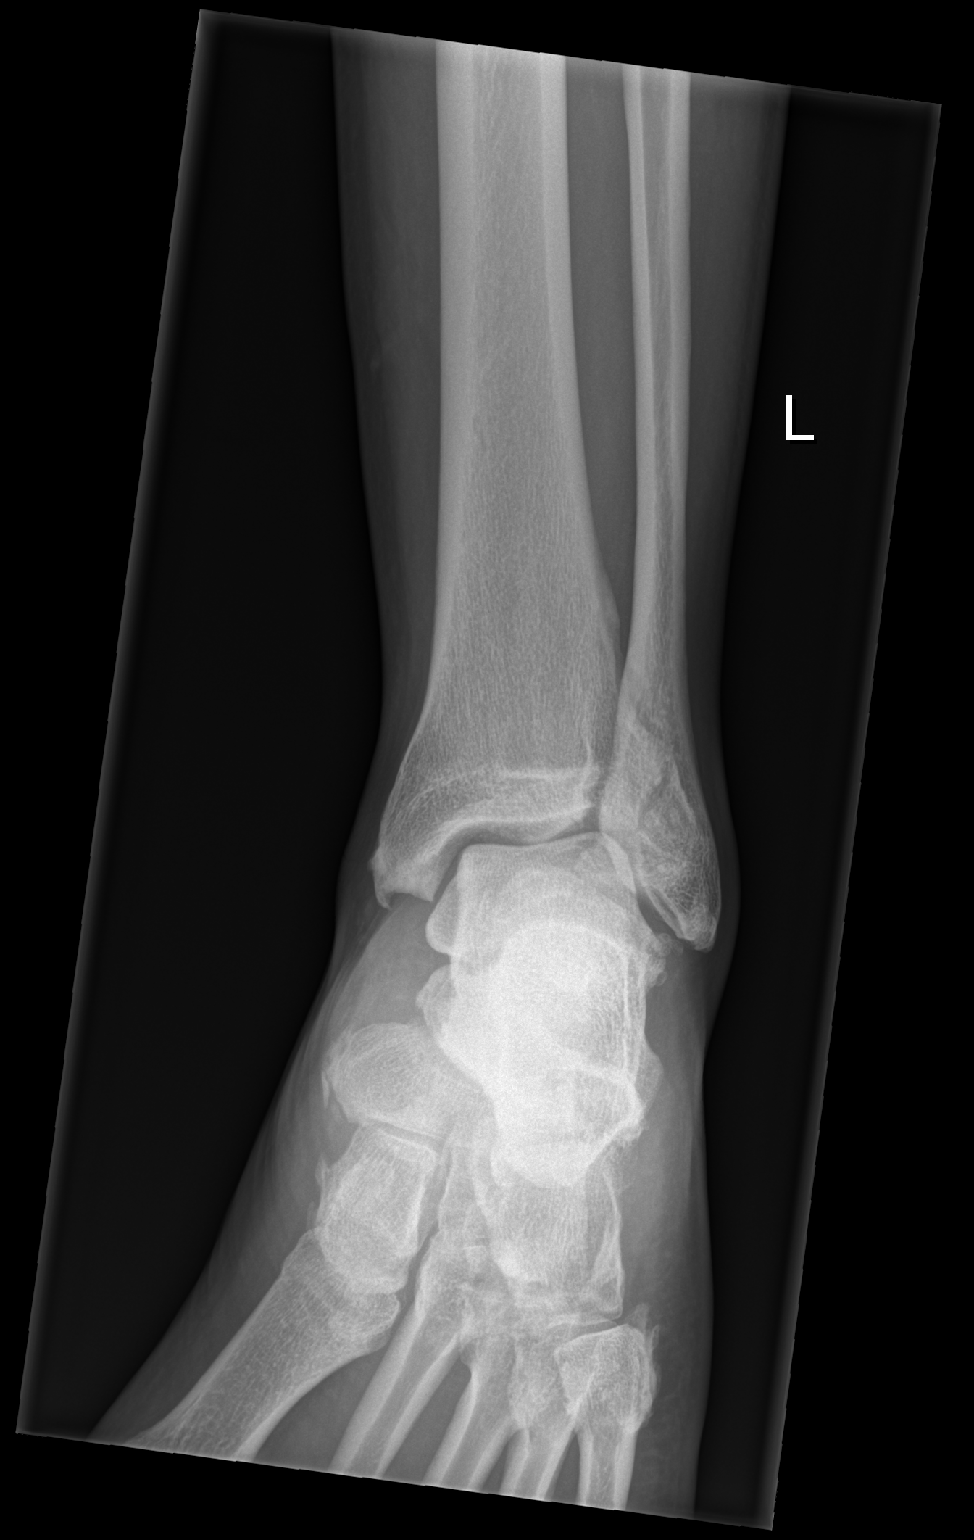

[x ankle obl left]
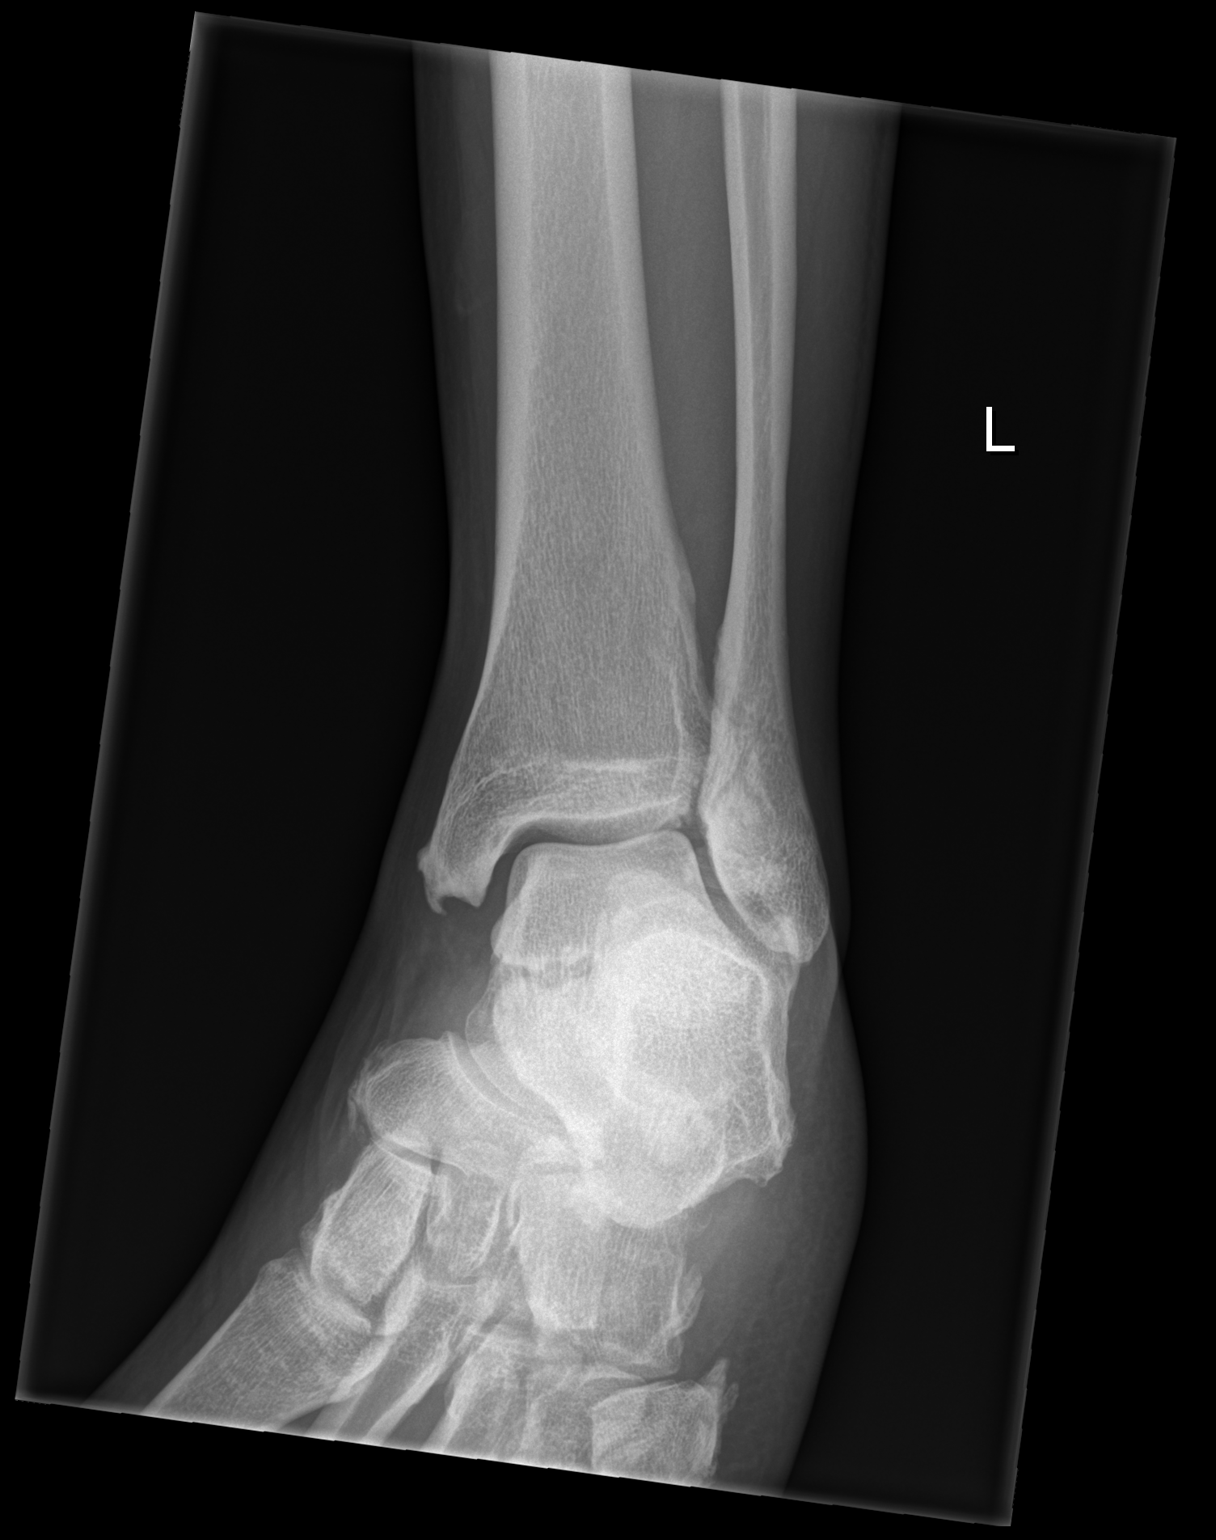

[x ankle lat left]
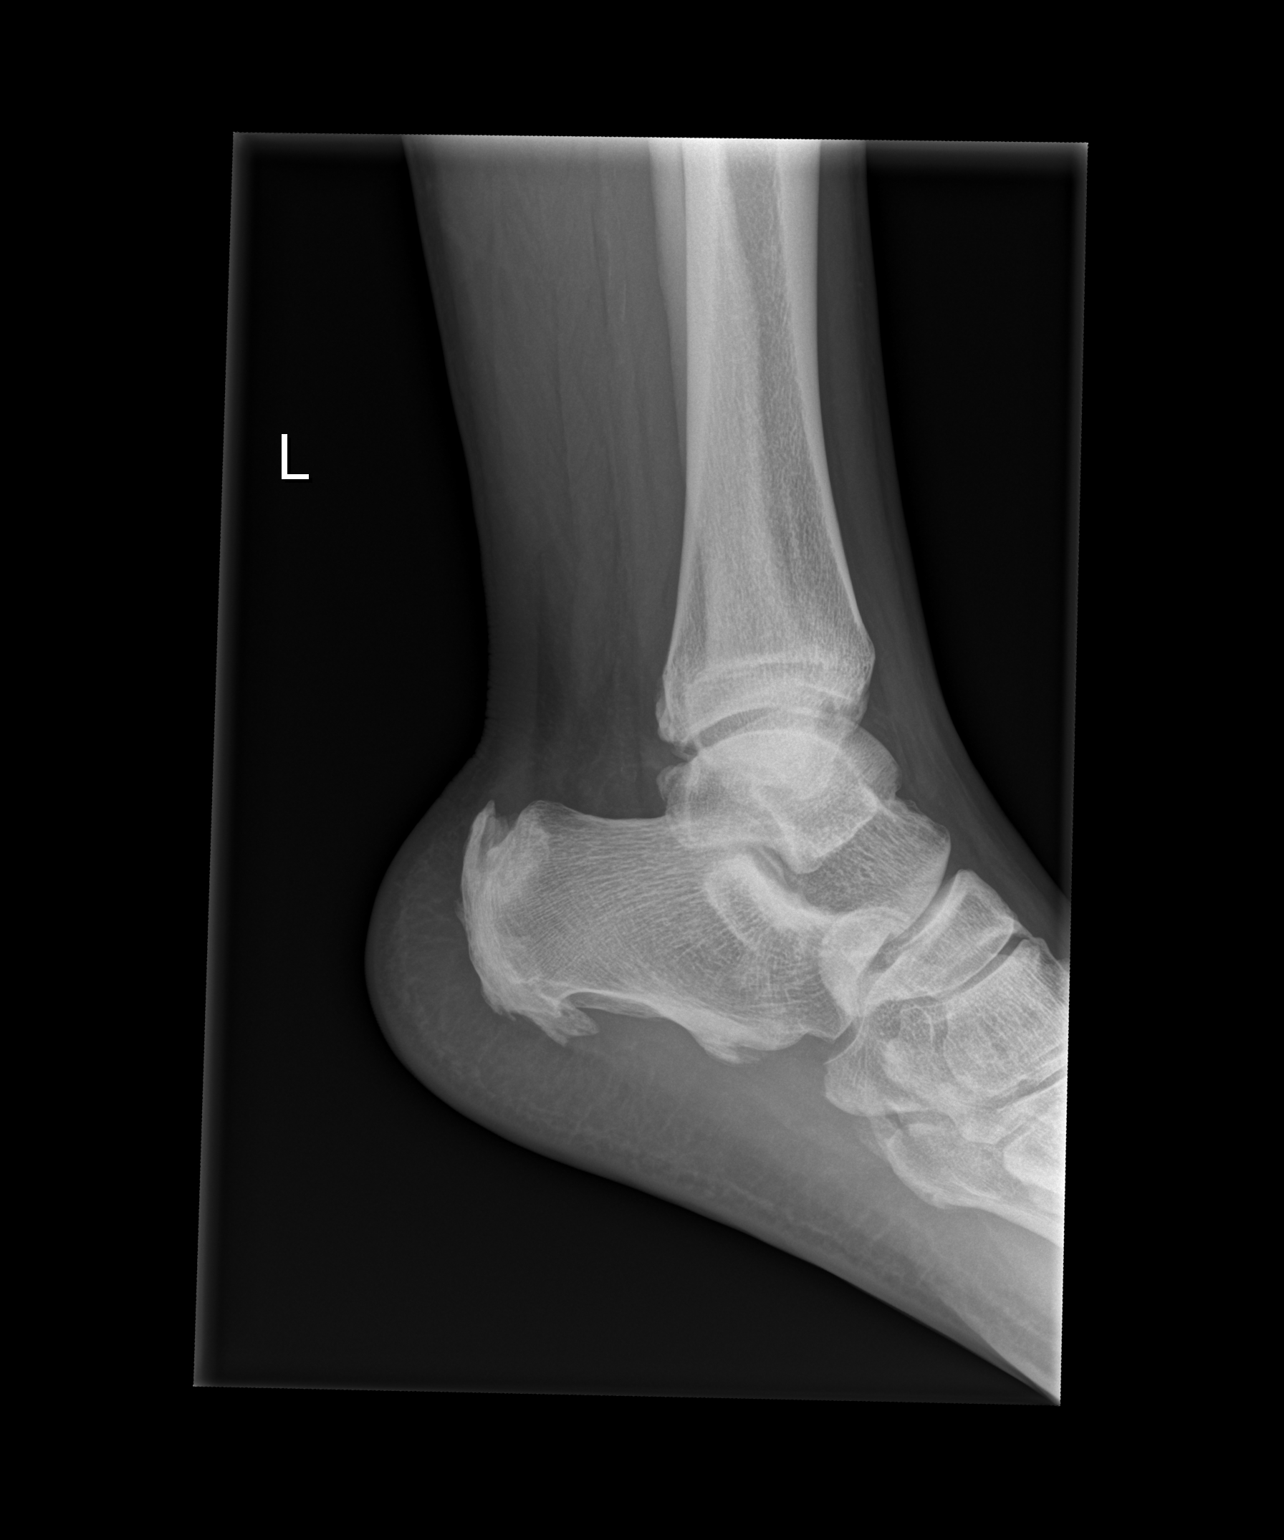

[3 of 3 positions shown; findings below may reference images not displayed]

FINDINGS: No acute fracture or dislocation. Well corticated ossific densities
at the tip of the lateral malleolus are consistent with prior
trauma. The talar dome is intact. The ankle mortise is symmetric. No
significant tibiotalar joint effusion. Prominent enthesopathy about
the mid foot and calcaneus. Soft tissues are unremarkable.
IMPRESSION: No acute osseous abnormality of the left ankle and foot.

## 2019-07-05 ENCOUNTER — Other Ambulatory Visit: Payer: Self-pay | Admitting: Internal Medicine

## 2019-07-07 ENCOUNTER — Other Ambulatory Visit: Payer: Self-pay

## 2019-07-08 ENCOUNTER — Telehealth: Payer: Self-pay | Admitting: Internal Medicine

## 2019-07-08 ENCOUNTER — Other Ambulatory Visit: Payer: Self-pay

## 2019-07-08 ENCOUNTER — Ambulatory Visit (INDEPENDENT_AMBULATORY_CARE_PROVIDER_SITE_OTHER): Payer: No Typology Code available for payment source | Admitting: Internal Medicine

## 2019-07-08 VITALS — BP 120/84 | HR 80 | Temp 97.9°F | Wt 247.6 lb

## 2019-07-08 DIAGNOSIS — M7062 Trochanteric bursitis, left hip: Secondary | ICD-10-CM

## 2019-07-08 DIAGNOSIS — E785 Hyperlipidemia, unspecified: Secondary | ICD-10-CM | POA: Diagnosis not present

## 2019-07-08 DIAGNOSIS — E119 Type 2 diabetes mellitus without complications: Secondary | ICD-10-CM | POA: Diagnosis not present

## 2019-07-08 LAB — POCT GLYCOSYLATED HEMOGLOBIN (HGB A1C): Hemoglobin A1C: 7.9 % — AB (ref 4.0–5.6)

## 2019-07-08 MED ORDER — PRAVASTATIN SODIUM 40 MG PO TABS: 40.0000 mg | ORAL_TABLET | Freq: Every day | ORAL | 1 refills | Status: DC

## 2019-07-08 NOTE — Progress Notes (Signed)
Established Patient Office Visit     This visit occurred during the SARS-CoV-2 public health emergency.  Safety protocols were in place, including screening questions prior to the visit, additional usage of staff PPE, and extensive cleaning of exam room while observing appropriate contact time as indicated for disinfecting solutions.    CC/Reason for Visit: Left hip pain, discuss chronic conditions  HPI: Reginald Kirby. is a 44 y.o. male who is coming in today for the above mentioned reasons. Past Medical History is significant for: Well-controlled hypertension, previously well controlled diabetes, obesity, hyperlipidemia, insomnia.  His pravastatin prescription got denied as he has not had a lipid panel in about 18 months.  He has not been checking CBGs at home as he ran out of test strips.  He states that he has not been following his diet plan as he should.  He suspects his sugar is worse.  The main reason for his visit today is left hip pain.  He sleeps on that left side and he knows that that bothers his hip more.  It hurts when he presses on his greater trochanter.  He has no limitation to movement of his hip.   Past Medical/Surgical History: Past Medical History:  Diagnosis Date  . Anxiety   . Arthritis    DDD  BACK  . Depression   . Diabetes mellitus   . GERD (gastroesophageal reflux disease)   . Headache    HX MIGRAINES   . Hyperlipidemia   . Hypertension   . Sinusitis     Past Surgical History:  Procedure Laterality Date  . CARDIAC CATHETERIZATION     2006    OKAY   . epidural injections   2002  . LUMBAR LAMINECTOMY/DECOMPRESSION MICRODISCECTOMY N/A 10/13/2015   Procedure: L3-4 L4-5 L5-S1 Laminectomy;  Surgeon: Barnett Abu, MD;  Location: [facility];  Service: Neurosurgery;  Laterality: N/A;  L3-4 L4-5 L5-S1 Laminectomy    Social History:  reports that he has never smoked. He has never used smokeless tobacco. He reports current alcohol use. He reports that  he does not use drugs.  Allergies: Allergies  Allergen Reactions  . Demerol Itching    Family History:  Family History  Problem Relation Age of Onset  . Colon cancer Father   . Heart disease Mother        has pacemaker  . Diabetes Brother      Current Outpatient Medications:  .  amLODipine (NORVASC) 5 MG tablet, TAKE 1 TABLET (5 MG TOTAL) BY MOUTH DAILY., Disp: 90 tablet, Rfl: 1 .  aspirin EC 81 MG tablet, Take 1 tablet (81 mg total) by mouth daily., Disp: , Rfl:  .  buPROPion (WELLBUTRIN XL) 150 MG 24 hr tablet, TAKE 1 TABLET (150 MG TOTAL) BY MOUTH DAILY., Disp: 90 tablet, Rfl: 1 .  colchicine 0.6 MG tablet, Take 1 tablet (0.6 mg total) by mouth 2 (two) times daily as needed., Disp: 60 tablet, Rfl: 0 .  glipiZIDE (GLIPIZIDE XL) 5 MG 24 hr tablet, Take 1 tablet (5 mg total) by mouth daily., Disp: 90 tablet, Rfl: 1 .  INVOKANA 100 MG TABS tablet, TAKE 1 TABLET (100 MG TOTAL) BY MOUTH DAILY BEFORE BREAKFAST., Disp: 90 tablet, Rfl: 1 .  losartan-hydrochlorothiazide (HYZAAR) 100-25 MG tablet, Take 1 tablet by mouth daily., Disp: 90 tablet, Rfl: 1 .  metFORMIN (GLUCOPHAGE) 1000 MG tablet, Take 1 tablet (1,000 mg total) by mouth daily., Disp: 90 tablet, Rfl: 1 .  omeprazole (PRILOSEC) 40 MG capsule, TAKE 1 CAPSULE (40 MG TOTAL) BY MOUTH DAILY., Disp: 90 capsule, Rfl: 1 .  pravastatin (PRAVACHOL) 40 MG tablet, Take 1 tablet (40 mg total) by mouth daily., Disp: 90 tablet, Rfl: 1  Review of Systems:  Constitutional: Denies fever, chills, diaphoresis, appetite change and fatigue.  HEENT: Denies photophobia, eye pain, redness, hearing loss, ear pain, congestion, sore throat, rhinorrhea, sneezing, mouth sores, trouble swallowing, neck pain, neck stiffness and tinnitus.   Respiratory: Denies SOB, DOE, cough, chest tightness,  and wheezing.   Cardiovascular: Denies chest pain, palpitations and leg swelling.  Gastrointestinal: Denies nausea, vomiting, abdominal pain, diarrhea, constipation,  blood in stool and abdominal distention.  Genitourinary: Denies dysuria, urgency, frequency, hematuria, flank pain and difficulty urinating.  Endocrine: Denies: hot or cold intolerance, sweats, changes in hair or nails, polyuria, polydipsia. Musculoskeletal: Denies myalgias, back pain, joint swelling,  and gait problem.  Skin: Denies pallor, rash and wound.  Neurological: Denies dizziness, seizures, syncope, weakness, light-headedness, numbness and headaches.  Hematological: Denies adenopathy. Easy bruising, personal or family bleeding history  Psychiatric/Behavioral: Denies suicidal ideation, mood changes, confusion, nervousness, sleep disturbance and agitation    Physical Exam: Vitals:   07/08/19 1107  BP: 120/84  Pulse: 80  Temp: 97.9 F (36.6 C)  TempSrc: Temporal  SpO2: 97%  Weight: 247 lb 9.6 oz (112.3 kg)    Body mass index is 33.58 kg/m.   Constitutional: NAD, calm, comfortable Eyes: PERRL, lids and conjunctivae normal, wears corrective lenses ENMT: Mucous membranes are moist. Respiratory: clear to auscultation bilaterally, no wheezing, no crackles. Normal respiratory effort. No accessory muscle use.  Cardiovascular: Regular rate and rhythm, no murmurs / rubs / gallops. No extremity edema.    Musculoskeletal: no clubbing / cyanosis. No joint deformity upper and lower extremities. Good ROM, no contractures. Normal muscle tone.  Pain to palpation of his left greater trochanteric bursa. Neurologic: Grossly intact and nonfocal Psychiatric: Normal judgment and insight. Alert and oriented x 3. Normal mood.    Impression and Plan:  Dyslipidemia  -Refill pravastatin, he has a physical scheduled for April. -Last LDL was 118 in June 2019, goal is less than 70.  Greater trochanteric bursitis of left hip -He will do NSAIDs and icing twice a day as needed. -He is hesitant to try steroids due to his diabetes, but I have advised him that a 6-day prednisone taper would be the  next step if no improvement in 7 to 10 days.  If no improvement with that could consider trochanteric bursa cortisone injection.  Diabetes mellitus without complication (HCC)  -G9Q has increased from 6 in November 20 19-7.9 today.  We will work on lifestyle modifications over the next 3 months.  He is aware that if we cannot get him below 7 we will need to augment his diabetic therapy.   Patient Instructions  -Nice seeing you today!!  -Ice for 15 min twice daily to left hip and NSAIDs as needed.  -Call if no improvement in 7-10 days.   Hip Bursitis  Hip bursitis is swelling of a fluid-filled sac (bursa) in your hip joint. This swelling (inflammation) can be painful. This condition may come and go over time. What are the causes?  Injury to the hip.  Overuse of the muscles that surround the hip joint.  An earlier injury or surgery of the hip.  Arthritis or gout.  Diabetes.  Thyroid disease.  Infection.  In some cases, the cause may not be known. What are  the signs or symptoms?  Mild or moderate pain in the hip area. Pain may get worse with movement.  Tenderness and swelling of the hip, especially on the outer side of the hip.  In rare cases, the bursa may become infected. This may cause: ? A fever. ? Warmth and redness in the area. Symptoms may come and go. How is this treated? This condition is treated by resting, icing, applying pressure (compression), and raising (elevating) the injured area. You may hear this called the RICE treatment. Treatment may also include:  Using crutches.  Draining fluid out of the bursa to help relieve swelling.  Giving a shot of (injecting) medicine that helps to reduce swelling (cortisone).  Other medicines if the bursa is infected. Follow these instructions at home: Managing pain, stiffness, and swelling   If told, put ice on the painful area. ? Put ice in a plastic bag. ? Place a towel between your skin and the bag. ? Leave  the ice on for 20 minutes, 2-3 times a day. ? Raise (elevate) your hip above the level of your heart as much as you can without pain. To do this, try putting a pillow under your hips while you lie down. Stop if this causes pain. Activity  Return to your normal activities as told by your doctor. Ask your doctor what activities are safe for you.  Rest and protect your hip as much as you can until you feel better. General instructions  Take over-the-counter and prescription medicines only as told by your doctor.  Wear wraps that put pressure on your hip (compression wraps) only as told by your doctor.  Do not use your hip to support your body weight until your doctor says that you can.  Use crutches as told by your doctor.  Gently rub and stretch your injured area as often as is comfortable.  Keep all follow-up visits as told by your doctor. This is important. How is this prevented?  Exercise regularly, as told by your doctor.  Warm up and stretch before being active.  Cool down and stretch after being active.  Avoid activities that bother your hip or cause pain.  Avoid sitting down for long periods at a time. Contact a doctor if:  You have a fever.  You get new symptoms.  You have trouble walking.  You have trouble doing everyday activities.  You have pain that gets worse.  You have pain that does not get better with medicine.  You get red skin on your hip area.  You get a feeling of warmth in your hip area. Get help right away if:  You cannot move your hip.  You have very bad pain. Summary  Hip bursitis is swelling of a fluid-filled sac (bursa) in your hip.  Hip bursitis can be painful.  Symptoms often come and go over time.  This condition is treated with rest, ice, compression, elevation, and medicines. This information is not intended to replace advice given to you by your health care provider. Make sure you discuss any questions you have with your  health care provider. Document Revised: 02/09/2018 Document Reviewed: 02/09/2018 Elsevier Patient Education  2020 [employer]      Chaya Jan, MD [facility]

## 2019-07-08 NOTE — Patient Instructions (Signed)
-Nice seeing you today!!  -Ice for 15 min twice daily to left hip and NSAIDs as needed.  -Call if no improvement in 7-10 days.   Hip Bursitis  Hip bursitis is swelling of a fluid-filled sac (bursa) in your hip joint. This swelling (inflammation) can be painful. This condition may come and go over time. What are the causes?  Injury to the hip.  Overuse of the muscles that surround the hip joint.  An earlier injury or surgery of the hip.  Arthritis or gout.  Diabetes.  Thyroid disease.  Infection.  In some cases, the cause may not be known. What are the signs or symptoms?  Mild or moderate pain in the hip area. Pain may get worse with movement.  Tenderness and swelling of the hip, especially on the outer side of the hip.  In rare cases, the bursa may become infected. This may cause: ? A fever. ? Warmth and redness in the area. Symptoms may come and go. How is this treated? This condition is treated by resting, icing, applying pressure (compression), and raising (elevating) the injured area. You may hear this called the RICE treatment. Treatment may also include:  Using crutches.  Draining fluid out of the bursa to help relieve swelling.  Giving a shot of (injecting) medicine that helps to reduce swelling (cortisone).  Other medicines if the bursa is infected. Follow these instructions at home: Managing pain, stiffness, and swelling   If told, put ice on the painful area. ? Put ice in a plastic bag. ? Place a towel between your skin and the bag. ? Leave the ice on for 20 minutes, 2-3 times a day. ? Raise (elevate) your hip above the level of your heart as much as you can without pain. To do this, try putting a pillow under your hips while you lie down. Stop if this causes pain. Activity  Return to your normal activities as told by your doctor. Ask your doctor what activities are safe for you.  Rest and protect your hip as much as you can until you feel  better. General instructions  Take over-the-counter and prescription medicines only as told by your doctor.  Wear wraps that put pressure on your hip (compression wraps) only as told by your doctor.  Do not use your hip to support your body weight until your doctor says that you can.  Use crutches as told by your doctor.  Gently rub and stretch your injured area as often as is comfortable.  Keep all follow-up visits as told by your doctor. This is important. How is this prevented?  Exercise regularly, as told by your doctor.  Warm up and stretch before being active.  Cool down and stretch after being active.  Avoid activities that bother your hip or cause pain.  Avoid sitting down for long periods at a time. Contact a doctor if:  You have a fever.  You get new symptoms.  You have trouble walking.  You have trouble doing everyday activities.  You have pain that gets worse.  You have pain that does not get better with medicine.  You get red skin on your hip area.  You get a feeling of warmth in your hip area. Get help right away if:  You cannot move your hip.  You have very bad pain. Summary  Hip bursitis is swelling of a fluid-filled sac (bursa) in your hip.  Hip bursitis can be painful.  Symptoms often come and go over time.  This condition is treated with rest, ice, compression, elevation, and medicines. This information is not intended to replace advice given to you by your health care provider. Make sure you discuss any questions you have with your health care provider. Document Revised: 02/09/2018 Document Reviewed: 02/09/2018 Elsevier Patient Education  [employer].

## 2019-07-08 NOTE — Telephone Encounter (Signed)
error 

## 2019-07-16 ENCOUNTER — Other Ambulatory Visit: Payer: Self-pay | Admitting: Internal Medicine

## 2019-07-16 ENCOUNTER — Encounter: Payer: Self-pay | Admitting: Internal Medicine

## 2019-07-16 DIAGNOSIS — M7062 Trochanteric bursitis, left hip: Secondary | ICD-10-CM

## 2019-07-16 DIAGNOSIS — E119 Type 2 diabetes mellitus without complications: Secondary | ICD-10-CM

## 2019-07-16 MED ORDER — FREESTYLE LITE TEST VI STRP: 1.0000 | ORAL_STRIP | Freq: Every day | 3 refills | Status: DC

## 2019-07-16 MED ORDER — PREDNISONE 10 MG (21) PO TBPK: ORAL_TABLET | ORAL | 0 refills | Status: DC

## 2019-07-27 ENCOUNTER — Encounter: Payer: Self-pay | Admitting: Internal Medicine

## 2019-07-27 DIAGNOSIS — E119 Type 2 diabetes mellitus without complications: Secondary | ICD-10-CM

## 2019-08-02 ENCOUNTER — Other Ambulatory Visit: Payer: Self-pay | Admitting: Internal Medicine

## 2019-08-16 ENCOUNTER — Encounter: Payer: Self-pay | Admitting: Internal Medicine

## 2019-09-06 ENCOUNTER — Other Ambulatory Visit: Payer: Self-pay | Admitting: Internal Medicine

## 2019-09-10 ENCOUNTER — Other Ambulatory Visit: Payer: Self-pay | Admitting: Internal Medicine

## 2019-09-10 ENCOUNTER — Encounter: Payer: Self-pay | Admitting: Internal Medicine

## 2019-09-10 ENCOUNTER — Ambulatory Visit (INDEPENDENT_AMBULATORY_CARE_PROVIDER_SITE_OTHER): Payer: No Typology Code available for payment source | Admitting: Internal Medicine

## 2019-09-10 ENCOUNTER — Other Ambulatory Visit: Payer: Self-pay

## 2019-09-10 VITALS — BP 140/90 | HR 82 | Temp 97.3°F | Ht 71.25 in | Wt 242.2 lb

## 2019-09-10 DIAGNOSIS — E785 Hyperlipidemia, unspecified: Secondary | ICD-10-CM

## 2019-09-10 DIAGNOSIS — E119 Type 2 diabetes mellitus without complications: Secondary | ICD-10-CM | POA: Diagnosis not present

## 2019-09-10 DIAGNOSIS — I10 Essential (primary) hypertension: Secondary | ICD-10-CM

## 2019-09-10 DIAGNOSIS — Z0000 Encounter for general adult medical examination without abnormal findings: Secondary | ICD-10-CM | POA: Diagnosis not present

## 2019-09-10 LAB — VITAMIN D 25 HYDROXY (VIT D DEFICIENCY, FRACTURES): VITD: 35.01 ng/mL (ref 30.00–100.00)

## 2019-09-10 LAB — COMPREHENSIVE METABOLIC PANEL
ALT: 33 U/L (ref 0–53)
AST: 21 U/L (ref 0–37)
Albumin: 4.6 g/dL (ref 3.5–5.2)
Alkaline Phosphatase: 54 U/L (ref 39–117)
BUN: 15 mg/dL (ref 6–23)
CO2: 31 mEq/L (ref 19–32)
Calcium: 10.2 mg/dL (ref 8.4–10.5)
Chloride: 99 mEq/L (ref 96–112)
Creatinine, Ser: 1.05 mg/dL (ref 0.40–1.50)
GFR: 93.05 mL/min (ref >60.00)
Glucose, Bld: 146 mg/dL — ABNORMAL HIGH (ref 70–99)
Potassium: 4.1 mEq/L (ref 3.5–5.1)
Sodium: 139 mEq/L (ref 135–145)
Total Bilirubin: 0.5 mg/dL (ref 0.2–1.2)
Total Protein: 7.3 g/dL (ref 6.0–8.3)

## 2019-09-10 LAB — LIPID PANEL
Cholesterol: 163 mg/dL (ref 0–200)
HDL: 45.3 mg/dL (ref >39.00)
LDL Cholesterol: 86 mg/dL (ref 0–99)
NonHDL: 117.48
Total CHOL/HDL Ratio: 4
Triglycerides: 156 mg/dL — ABNORMAL HIGH (ref 0.0–149.0)
VLDL: 31.2 mg/dL (ref 0.0–40.0)

## 2019-09-10 LAB — CBC WITH DIFFERENTIAL/PLATELET
Basophils Absolute: 0 10*3/uL (ref 0.0–0.1)
Basophils Relative: 0.4 % (ref 0.0–3.0)
Eosinophils Absolute: 0.1 10*3/uL (ref 0.0–0.7)
Eosinophils Relative: 1.6 % (ref 0.0–5.0)
HCT: 41 % (ref 39.0–52.0)
Hemoglobin: 13.2 g/dL (ref 13.0–17.0)
Lymphocytes Relative: 45.1 % (ref 12.0–46.0)
Lymphs Abs: 2 10*3/uL (ref 0.7–4.0)
MCHC: 32.2 g/dL (ref 30.0–36.0)
MCV: 71.9 fl — ABNORMAL LOW (ref 78.0–100.0)
Monocytes Absolute: 0.3 10*3/uL (ref 0.1–1.0)
Monocytes Relative: 6.2 % (ref 3.0–12.0)
Neutro Abs: 2 10*3/uL (ref 1.4–7.7)
Neutrophils Relative %: 46.7 % (ref 43.0–77.0)
Platelets: 309 10*3/uL (ref 150.0–400.0)
RBC: 5.71 Mil/uL (ref 4.22–5.81)
RDW: 15.7 % — ABNORMAL HIGH (ref 11.5–15.5)
WBC: 4.4 10*3/uL (ref 4.0–10.5)

## 2019-09-10 LAB — HEMOGLOBIN A1C: Hgb A1c MFr Bld: 7.3 % — ABNORMAL HIGH (ref 4.6–6.5)

## 2019-09-10 LAB — TSH: TSH: 1.89 u[IU]/mL (ref 0.35–4.50)

## 2019-09-10 LAB — VITAMIN B12: Vitamin B-12: 543 pg/mL (ref 211–911)

## 2019-09-10 MED ORDER — PRAVASTATIN SODIUM 80 MG PO TABS: 80.0000 mg | ORAL_TABLET | Freq: Every day | ORAL | 1 refills | Status: DC

## 2019-09-10 MED ORDER — METFORMIN HCL 500 MG PO TABS: 500.0000 mg | ORAL_TABLET | Freq: Two times a day (BID) | ORAL | 1 refills | Status: DC

## 2019-09-10 NOTE — Patient Instructions (Signed)
-Nice seeing you today!!  -Lab work today; will notify you once results are available.  -Split metformin to 500 twice daily.  -Schedule a 3 month follow up.   Preventive Care 53-44 Years Old, Male Preventive care refers to lifestyle choices and visits with your health care provider that can promote health and wellness. This includes:  A yearly physical exam. This is also called an annual well check.  Regular dental and eye exams.  Immunizations.  Screening for certain conditions.  Healthy lifestyle choices, such as eating a healthy diet, getting regular exercise, not using drugs or products that contain nicotine and tobacco, and limiting alcohol use. What can I expect for my preventive care visit? Physical exam Your health care provider will check:  Height and weight. These may be used to calculate body mass index (BMI), which is a measurement that tells if you are at a healthy weight.  Heart rate and blood pressure.  Your skin for abnormal spots. Counseling Your health care provider may ask you questions about:  Alcohol, tobacco, and drug use.  Emotional well-being.  Home and relationship well-being.  Sexual activity.  Eating habits.  Work and work Statistician. What immunizations do I need?  Influenza (flu) vaccine  This is recommended every year. Tetanus, diphtheria, and pertussis (Tdap) vaccine  You may need a Td booster every 10 years. Varicella (chickenpox) vaccine  You may need this vaccine if you have not already been vaccinated. Zoster (shingles) vaccine  You may need this after age 46. Measles, mumps, and rubella (MMR) vaccine  You may need at least one dose of MMR if you were born in 1957 or later. You may also need a second dose. Pneumococcal conjugate (PCV13) vaccine  You may need this if you have certain conditions and were not previously vaccinated. Pneumococcal polysaccharide (PPSV23) vaccine  You may need one or two doses if you smoke  cigarettes or if you have certain conditions. Meningococcal conjugate (MenACWY) vaccine  You may need this if you have certain conditions. Hepatitis A vaccine  You may need this if you have certain conditions or if you travel or work in places where you may be exposed to hepatitis A. Hepatitis B vaccine  You may need this if you have certain conditions or if you travel or work in places where you may be exposed to hepatitis B. Haemophilus influenzae type b (Hib) vaccine  You may need this if you have certain risk factors. Human papillomavirus (HPV) vaccine  If recommended by your health care provider, you may need three doses over 6 months. You may receive vaccines as individual doses or as more than one vaccine together in one shot (combination vaccines). Talk with your health care provider about the risks and benefits of combination vaccines. What tests do I need? Blood tests  Lipid and cholesterol levels. These may be checked every 5 years, or more frequently if you are over 5 years old.  Hepatitis C test.  Hepatitis B test. Screening  Lung cancer screening. You may have this screening every year starting at age 39 if you have a 30-pack-year history of smoking and currently smoke or have quit within the past 15 years.  Prostate cancer screening. Recommendations will vary depending on your family history and other risks.  Colorectal cancer screening. All adults should have this screening starting at age 69 and continuing until age 90. Your health care provider may recommend screening at age 81 if you are at increased risk. You will have  tests every 1-10 years, depending on your results and the type of screening test.  Diabetes screening. This is done by checking your blood sugar (glucose) after you have not eaten for a while (fasting). You may have this done every 1-3 years.  Sexually transmitted disease (STD) testing. Follow these instructions at home: Eating and  drinking  Eat a diet that includes fresh fruits and vegetables, whole grains, lean protein, and low-fat dairy products.  Take vitamin and mineral supplements as recommended by your health care provider.  Do not drink alcohol if your health care provider tells you not to drink.  If you drink alcohol: ? Limit how much you have to 0-2 drinks a day. ? Be aware of how much alcohol is in your drink. In the [place] one drink equals one 12 oz bottle of beer (355 mL), one 5 oz glass of wine (148 mL), or one 1 oz glass of hard liquor (44 mL). Lifestyle  Take daily care of your teeth and gums.  Stay active. Exercise for at least 30 minutes on 5 or more days each week.  Do not use any products that contain nicotine or tobacco, such as cigarettes, e-cigarettes, and chewing tobacco. If you need help quitting, ask your health care provider.  If you are sexually active, practice safe sex. Use a condom or other form of protection to prevent STIs (sexually transmitted infections).  Talk with your health care provider about taking a low-dose aspirin every day starting at age 31. What's next?  Go to your health care provider once a year for a well check visit.  Ask your health care provider how often you should have your eyes and teeth checked.  Stay up to date on all vaccines. This information is not intended to replace advice given to you by your health care provider. Make sure you discuss any questions you have with your health care provider. Document Revised: 05/28/2018 Document Reviewed: 05/28/2018 Elsevier Patient Education  2020 [employer].

## 2019-09-10 NOTE — Progress Notes (Signed)
Established Patient Office Visit     This visit occurred during the SARS-CoV-2 public health emergency.  Safety protocols were in place, including screening questions prior to the visit, additional usage of staff PPE, and extensive cleaning of exam room while observing appropriate contact time as indicated for disinfecting solutions.    CC/Reason for Visit: Annual preventive exam  HPI: Reginald Kirby. is a 44 y.o. male who is coming in today for the above mentioned reasons. Past Medical History is significant for: Hypertension, type 2 diabetes, hyperlipidemia, obesity, insomnia.  He has started a weight loss program with intermittent fasting.  Has noticed that his sugars have dropped.  He discontinued use of Invokana.  He has been taking Metformin 1000 mg in the mornings.  Notices that his fasting blood sugar is around 150 but can drop as low as 60s to 70s in the mornings while during his fasting..  Other than this he has no complaints.  He has routine eye care, dental care.  Had a colonoscopy in 2018.   Past Medical/Surgical History: Past Medical History:  Diagnosis Date  . Anxiety   . Arthritis    DDD  BACK  . Depression   . Diabetes mellitus   . GERD (gastroesophageal reflux disease)   . Headache    HX MIGRAINES   . Hyperlipidemia   . Hypertension   . Sinusitis     Past Surgical History:  Procedure Laterality Date  . CARDIAC CATHETERIZATION     2006    OKAY   . epidural injections   2002  . LUMBAR LAMINECTOMY/DECOMPRESSION MICRODISCECTOMY N/A 10/13/2015   Procedure: L3-4 L4-5 L5-S1 Laminectomy;  Surgeon: Kristeen Miss, MD;  Location: [facility];  Service: Neurosurgery;  Laterality: N/A;  L3-4 L4-5 L5-S1 Laminectomy    Social History:  reports that he has never smoked. He has never used smokeless tobacco. He reports current alcohol use. He reports that he does not use drugs.  Allergies: Allergies  Allergen Reactions  . Demerol Itching    Family History:    Family History  Problem Relation Age of Onset  . Colon cancer Father   . Heart disease Mother        has pacemaker  . Diabetes Brother      Current Outpatient Medications:  .  amLODipine (NORVASC) 5 MG tablet, TAKE 1 TABLET (5 MG TOTAL) BY MOUTH DAILY., Disp: 90 tablet, Rfl: 1 .  aspirin EC 81 MG tablet, Take 1 tablet (81 mg total) by mouth daily., Disp: , Rfl:  .  buPROPion (WELLBUTRIN XL) 150 MG 24 hr tablet, TAKE 1 TABLET (150 MG TOTAL) BY MOUTH DAILY., Disp: 90 tablet, Rfl: 1 .  GLIPIZIDE XL 5 MG 24 hr tablet, TAKE 1 TABLET (5 MG TOTAL) BY MOUTH DAILY., Disp: 90 tablet, Rfl: 1 .  glucose blood (FREESTYLE LITE) test strip, 1 each by Other route daily. Dx E11.9, Disp: 100 each, Rfl: 3 .  losartan-hydrochlorothiazide (HYZAAR) 100-25 MG tablet, TAKE 1 TABLET BY MOUTH DAILY., Disp: 60 tablet, Rfl: 1 .  metFORMIN (GLUCOPHAGE) 500 MG tablet, Take 1 tablet (500 mg total) by mouth 2 (two) times daily with a meal., Disp: 180 tablet, Rfl: 1 .  omeprazole (PRILOSEC) 40 MG capsule, TAKE 1 CAPSULE (40 MG TOTAL) BY MOUTH DAILY., Disp: 90 capsule, Rfl: 1 .  pravastatin (PRAVACHOL) 40 MG tablet, Take 1 tablet (40 mg total) by mouth daily., Disp: 90 tablet, Rfl: 1  Review of Systems:  Constitutional:  Denies fever, chills, diaphoresis, appetite change and fatigue.  HEENT: Denies photophobia, eye pain, redness, hearing loss, ear pain, congestion, sore throat, rhinorrhea, sneezing, mouth sores, trouble swallowing, neck pain, neck stiffness and tinnitus.   Respiratory: Denies SOB, DOE, cough, chest tightness,  and wheezing.   Cardiovascular: Denies chest pain, palpitations and leg swelling.  Gastrointestinal: Denies nausea, vomiting, abdominal pain, diarrhea, constipation, blood in stool and abdominal distention.  Genitourinary: Denies dysuria, urgency, frequency, hematuria, flank pain and difficulty urinating.  Endocrine: Denies: hot or cold intolerance, sweats, changes in hair or nails, polyuria,  polydipsia. Musculoskeletal: Denies myalgias, back pain, joint swelling, arthralgias and gait problem.  Skin: Denies pallor, rash and wound.  Neurological: Denies dizziness, seizures, syncope, weakness, light-headedness, numbness and headaches.  Hematological: Denies adenopathy. Easy bruising, personal or family bleeding history  Psychiatric/Behavioral: Denies suicidal ideation, mood changes, confusion, nervousness, sleep disturbance and agitation    Physical Exam: Vitals:   09/10/19 0700  BP: 140/90  Pulse: 82  Temp: (!) 97.3 F (36.3 C)  TempSrc: Temporal  SpO2: 97%  Weight: 242 lb 3.2 oz (109.9 kg)  Height: 5' 11.25" (1.81 m)    Body mass index is 33.54 kg/m.   Constitutional: NAD, calm, comfortable Eyes: PERRL, lids and conjunctivae normal ENMT: Mucous membranes are moist. Tympanic membrane is pearly white, no erythema or bulging. Neck: normal, supple, no masses, no thyromegaly Respiratory: clear to auscultation bilaterally, no wheezing, no crackles. Normal respiratory effort. No accessory muscle use.  Cardiovascular: Regular rate and rhythm, no murmurs / rubs / gallops. No extremity edema. 2+ pedal pulses.  Abdomen: no tenderness, no masses palpated. No hepatosplenomegaly. Bowel sounds positive.  Musculoskeletal: no clubbing / cyanosis. No joint deformity upper and lower extremities. Good ROM, no contractures. Normal muscle tone.  Skin: no rashes, lesions, ulcers. No induration Neurologic: CN 2-12 grossly intact. Sensation intact, DTR normal. Strength 5/5 in all 4.  Psychiatric: Normal judgment and insight. Alert and oriented x 3. Normal mood.   Diabetic Foot Exam - Simple   Simple Foot Form Diabetic Foot exam was performed with the following findings: Yes 09/10/2019  7:28 AM  Visual Inspection No deformities, no ulcerations, no other skin breakdown bilaterally: Yes Sensation Testing Intact to touch and monofilament testing bilaterally: Yes Pulse Check Posterior  Tibialis and Dorsalis pulse intact bilaterally: Yes Comments       Impression and Plan:  Encounter for preventive health examination  -He has routine eye care, advised routine dental  -His vaccinations are up-to-date including Covid x2. -Screening labs today. -Healthy lifestyle discussed in detail. -Had a colonoscopy in 2018, 5-year callback. -Check PSA for prostate cancer screening.  Diabetes mellitus without complication ([facility]) -At last visit A1c decreased to 7.9. -Suspect will have dropped further given lifestyle changes, -I will split his Metformin to 500 twice daily to afford better overnight coverage and try and avoid midmorning lows. -A1c today.  Essential hypertension -Not well controlled with a blood pressure 140/90 in office today. -I will ask him to do ambulatory measurements and call us back with 2 to 3 weeks worth of data to determine if increased therapy is necessary.  Dyslipidemia  - Plan: Lipid panel -Last LDL was 118 in June 2019.  Goal is less than 70. -He is on pravastatin 40.    Patient Instructions  -Nice seeing you today!!  -Lab work today; will notify you once results are available.  -Split metformin to 500 twice daily.  -Schedule a 3 month follow up.   Preventive Care  31-15 Years Old, Male Preventive care refers to lifestyle choices and visits with your health care provider that can promote health and wellness. This includes:  A yearly physical exam. This is also called an annual well check.  Regular dental and eye exams.  Immunizations.  Screening for certain conditions.  Healthy lifestyle choices, such as eating a healthy diet, getting regular exercise, not using drugs or products that contain nicotine and tobacco, and limiting alcohol use. What can I expect for my preventive care visit? Physical exam Your health care provider will check:  Height and weight. These may be used to calculate body mass index (BMI), which is a  measurement that tells if you are at a healthy weight.  Heart rate and blood pressure.  Your skin for abnormal spots. Counseling Your health care provider may ask you questions about:  Alcohol, tobacco, and drug use.  Emotional well-being.  Home and relationship well-being.  Sexual activity.  Eating habits.  Work and work Statistician. What immunizations do I need?  Influenza (flu) vaccine  This is recommended every year. Tetanus, diphtheria, and pertussis (Tdap) vaccine  You may need a Td booster every 10 years. Varicella (chickenpox) vaccine  You may need this vaccine if you have not already been vaccinated. Zoster (shingles) vaccine  You may need this after age 41. Measles, mumps, and rubella (MMR) vaccine  You may need at least one dose of MMR if you were born in 1957 or later. You may also need a second dose. Pneumococcal conjugate (PCV13) vaccine  You may need this if you have certain conditions and were not previously vaccinated. Pneumococcal polysaccharide (PPSV23) vaccine  You may need one or two doses if you smoke cigarettes or if you have certain conditions. Meningococcal conjugate (MenACWY) vaccine  You may need this if you have certain conditions. Hepatitis A vaccine  You may need this if you have certain conditions or if you travel or work in places where you may be exposed to hepatitis A. Hepatitis B vaccine  You may need this if you have certain conditions or if you travel or work in places where you may be exposed to hepatitis B. Haemophilus influenzae type b (Hib) vaccine  You may need this if you have certain risk factors. Human papillomavirus (HPV) vaccine  If recommended by your health care provider, you may need three doses over 6 months. You may receive vaccines as individual doses or as more than one vaccine together in one shot (combination vaccines). Talk with your health care provider about the risks and benefits of combination  vaccines. What tests do I need? Blood tests  Lipid and cholesterol levels. These may be checked every 5 years, or more frequently if you are over 66 years old.  Hepatitis C test.  Hepatitis B test. Screening  Lung cancer screening. You may have this screening every year starting at age 30 if you have a 30-pack-year history of smoking and currently smoke or have quit within the past 15 years.  Prostate cancer screening. Recommendations will vary depending on your family history and other risks.  Colorectal cancer screening. All adults should have this screening starting at age 85 and continuing until age 8. Your health care provider may recommend screening at age 4 if you are at increased risk. You will have tests every 1-10 years, depending on your results and the type of screening test.  Diabetes screening. This is done by checking your blood sugar (glucose) after you have not eaten for  a while (fasting). You may have this done every 1-3 years.  Sexually transmitted disease (STD) testing. Follow these instructions at home: Eating and drinking  Eat a diet that includes fresh fruits and vegetables, whole grains, lean protein, and low-fat dairy products.  Take vitamin and mineral supplements as recommended by your health care provider.  Do not drink alcohol if your health care provider tells you not to drink.  If you drink alcohol: ? Limit how much you have to 0-2 drinks a day. ? Be aware of how much alcohol is in your drink. In the [place] one drink equals one 12 oz bottle of beer (355 mL), one 5 oz glass of wine (148 mL), or one 1 oz glass of hard liquor (44 mL). Lifestyle  Take daily care of your teeth and gums.  Stay active. Exercise for at least 30 minutes on 5 or more days each week.  Do not use any products that contain nicotine or tobacco, such as cigarettes, e-cigarettes, and chewing tobacco. If you need help quitting, ask your health care provider.  If you are  sexually active, practice safe sex. Use a condom or other form of protection to prevent STIs (sexually transmitted infections).  Talk with your health care provider about taking a low-dose aspirin every day starting at age 4. What's next?  Go to your health care provider once a year for a well check visit.  Ask your health care provider how often you should have your eyes and teeth checked.  Stay up to date on all vaccines. This information is not intended to replace advice given to you by your health care provider. Make sure you discuss any questions you have with your health care provider. Document Revised: 05/28/2018 Document Reviewed: 05/28/2018 Elsevier Patient Education  2020 [place], MD [facility]

## 2019-09-29 ENCOUNTER — Encounter: Payer: No Typology Code available for payment source | Admitting: Internal Medicine

## 2019-10-18 ENCOUNTER — Other Ambulatory Visit: Payer: Self-pay | Admitting: Internal Medicine

## 2019-11-01 ENCOUNTER — Other Ambulatory Visit: Payer: Self-pay | Admitting: Internal Medicine

## 2019-12-10 ENCOUNTER — Ambulatory Visit: Payer: No Typology Code available for payment source | Admitting: Internal Medicine

## 2020-01-03 ENCOUNTER — Other Ambulatory Visit: Payer: Self-pay | Admitting: Internal Medicine

## 2020-01-05 ENCOUNTER — Encounter: Payer: Self-pay | Admitting: Internal Medicine

## 2020-01-05 MED ORDER — LOSARTAN POTASSIUM-HCTZ 100-25 MG PO TABS: 1.0000 | ORAL_TABLET | Freq: Every day | ORAL | 1 refills | Status: DC

## 2020-01-31 ENCOUNTER — Other Ambulatory Visit: Payer: Self-pay | Admitting: Internal Medicine

## 2020-03-30 ENCOUNTER — Other Ambulatory Visit: Payer: Self-pay | Admitting: Internal Medicine

## 2020-03-30 DIAGNOSIS — E785 Hyperlipidemia, unspecified: Secondary | ICD-10-CM

## 2020-04-01 ENCOUNTER — Other Ambulatory Visit: Payer: Self-pay | Admitting: Internal Medicine

## 2020-04-07 ENCOUNTER — Ambulatory Visit: Payer: No Typology Code available for payment source | Admitting: Internal Medicine

## 2020-04-17 ENCOUNTER — Other Ambulatory Visit: Payer: Self-pay | Admitting: Internal Medicine

## 2020-04-17 DIAGNOSIS — E119 Type 2 diabetes mellitus without complications: Secondary | ICD-10-CM

## 2020-04-19 ENCOUNTER — Other Ambulatory Visit: Payer: Self-pay | Admitting: Internal Medicine

## 2020-04-19 ENCOUNTER — Other Ambulatory Visit: Payer: Self-pay | Admitting: *Deleted

## 2020-04-19 ENCOUNTER — Other Ambulatory Visit: Payer: Self-pay

## 2020-04-19 ENCOUNTER — Encounter: Payer: Self-pay | Admitting: Internal Medicine

## 2020-04-19 ENCOUNTER — Ambulatory Visit (INDEPENDENT_AMBULATORY_CARE_PROVIDER_SITE_OTHER): Payer: No Typology Code available for payment source | Admitting: Internal Medicine

## 2020-04-19 VITALS — BP 150/90 | HR 75 | Temp 98.6°F | Wt 249.7 lb

## 2020-04-19 DIAGNOSIS — K219 Gastro-esophageal reflux disease without esophagitis: Secondary | ICD-10-CM

## 2020-04-19 DIAGNOSIS — I10 Essential (primary) hypertension: Secondary | ICD-10-CM

## 2020-04-19 DIAGNOSIS — E119 Type 2 diabetes mellitus without complications: Secondary | ICD-10-CM

## 2020-04-19 DIAGNOSIS — E785 Hyperlipidemia, unspecified: Secondary | ICD-10-CM | POA: Diagnosis not present

## 2020-04-19 DIAGNOSIS — G4700 Insomnia, unspecified: Secondary | ICD-10-CM

## 2020-04-19 LAB — POCT GLYCOSYLATED HEMOGLOBIN (HGB A1C): Hemoglobin A1C: 7.8 % — AB (ref 4.0–5.6)

## 2020-04-19 MED ORDER — PRAVASTATIN SODIUM 80 MG PO TABS: 80.0000 mg | ORAL_TABLET | Freq: Every day | ORAL | 1 refills | Status: DC

## 2020-04-19 MED ORDER — AMLODIPINE BESYLATE 10 MG PO TABS: 10.0000 mg | ORAL_TABLET | Freq: Every day | ORAL | 1 refills | Status: DC

## 2020-04-19 MED ORDER — OZEMPIC (0.25 OR 0.5 MG/DOSE) 2 MG/1.5ML ~~LOC~~ SOPN: 0.5000 mg | PEN_INJECTOR | SUBCUTANEOUS | 3 refills | Status: DC

## 2020-04-19 MED ORDER — BUPROPION HCL ER (XL) 150 MG PO TB24: 150.0000 mg | ORAL_TABLET | Freq: Every day | ORAL | 1 refills | Status: DC

## 2020-04-19 MED ORDER — OMEPRAZOLE 40 MG PO CPDR: 40.0000 mg | DELAYED_RELEASE_CAPSULE | Freq: Every day | ORAL | 1 refills | Status: DC

## 2020-04-19 MED ORDER — LOSARTAN POTASSIUM-HCTZ 100-25 MG PO TABS: 1.0000 | ORAL_TABLET | Freq: Every day | ORAL | 1 refills | Status: DC

## 2020-04-19 MED ORDER — GLIPIZIDE ER 5 MG PO TB24: 5.0000 mg | ORAL_TABLET | Freq: Every day | ORAL | 1 refills | Status: DC

## 2020-04-19 MED ORDER — METFORMIN HCL 500 MG PO TABS: 500.0000 mg | ORAL_TABLET | Freq: Two times a day (BID) | ORAL | 1 refills | Status: DC

## 2020-04-19 NOTE — Patient Instructions (Signed)
-  Nice seeing you today!!  -Increase amlodipine to 10 mg daily.  -Start Ozempic 0.25 weekly on Wednesday's for 4 weeks and then 0.5 weekly.  -Schedule follow up in 3 months. Come in fasting for labs.

## 2020-04-19 NOTE — Progress Notes (Signed)
Established Patient Office Visit     This visit occurred during the SARS-CoV-2 public health emergency.  Safety protocols were in place, including screening questions prior to the visit, additional usage of staff PPE, and extensive cleaning of exam room while observing appropriate contact time as indicated for disinfecting solutions.    CC/Reason for Visit: Follow-up chronic medical conditions  HPI: Reginald Kirby. is a 44 y.o. male who is coming in today for the above mentioned reasons. Past Medical History is significant for: Hypertension, type 2 diabetes, hyperlipidemia, obesity, insomnia.  He admits to not doing well with his healthy eating habits.  He is compliant with medications.  His blood pressure has been elevated.  His A1c is above goal at 7.8 today.  He is otherwise doing well and has no acute complaints.  He completed his NP program.   Past Medical/Surgical History: Past Medical History:  Diagnosis Date  . Anxiety   . Arthritis    DDD  BACK  . Depression   . Diabetes mellitus   . GERD (gastroesophageal reflux disease)   . Headache    HX MIGRAINES   . Hyperlipidemia   . Hypertension   . Sinusitis     Past Surgical History:  Procedure Laterality Date  . CARDIAC CATHETERIZATION     2006    OKAY   . epidural injections   2002  . LUMBAR LAMINECTOMY/DECOMPRESSION MICRODISCECTOMY N/A 10/13/2015   Procedure: L3-4 L4-5 L5-S1 Laminectomy;  Surgeon: Barnett Abu, MD;  Location: [facility];  Service: Neurosurgery;  Laterality: N/A;  L3-4 L4-5 L5-S1 Laminectomy    Social History:  reports that he has never smoked. He has never used smokeless tobacco. He reports current alcohol use. He reports that he does not use drugs.  Allergies: Allergies  Allergen Reactions  . Demerol Itching    Family History:  Family History  Problem Relation Age of Onset  . Colon cancer Father   . Heart disease Mother        has pacemaker  . Diabetes Brother      Current  Outpatient Medications:  .  amLODipine (NORVASC) 10 MG tablet, Take 1 tablet (10 mg total) by mouth daily., Disp: 90 tablet, Rfl: 1 .  aspirin EC 81 MG tablet, Take 1 tablet (81 mg total) by mouth daily., Disp: , Rfl:  .  buPROPion (WELLBUTRIN XL) 150 MG 24 hr tablet, Take 1 tablet (150 mg total) by mouth daily., Disp: 90 tablet, Rfl: 1 .  glipiZIDE (GLIPIZIDE XL) 5 MG 24 hr tablet, Take 1 tablet (5 mg total) by mouth daily with breakfast., Disp: 90 tablet, Rfl: 1 .  glucose blood (FREESTYLE LITE) test strip, 1 each by Other route daily. Dx E11.9, Disp: 100 each, Rfl: 3 .  losartan-hydrochlorothiazide (HYZAAR) 100-25 MG tablet, Take 1 tablet by mouth daily., Disp: 90 tablet, Rfl: 1 .  metFORMIN (GLUCOPHAGE) 500 MG tablet, Take 1 tablet (500 mg total) by mouth 2 (two) times daily with a meal., Disp: 180 tablet, Rfl: 1 .  omeprazole (PRILOSEC) 40 MG capsule, Take 1 capsule (40 mg total) by mouth daily., Disp: 90 capsule, Rfl: 1 .  pravastatin (PRAVACHOL) 80 MG tablet, Take 1 tablet (80 mg total) by mouth daily., Disp: 90 tablet, Rfl: 1 .  Semaglutide,0.25 or 0.5MG /DOS, (OZEMPIC, 0.25 OR 0.5 MG/DOSE,) 2 MG/1.5ML SOPN, Inject 0.5 mg into the skin once a week., Disp: 1.5 mL, Rfl: 3  Review of Systems:  Constitutional: Denies fever, chills,  diaphoresis, appetite change and fatigue.  HEENT: Denies photophobia, eye pain, redness, hearing loss, ear pain, congestion, sore throat, rhinorrhea, sneezing, mouth sores, trouble swallowing, neck pain, neck stiffness and tinnitus.   Respiratory: Denies SOB, DOE, cough, chest tightness,  and wheezing.   Cardiovascular: Denies chest pain, palpitations and leg swelling.  Gastrointestinal: Denies nausea, vomiting, abdominal pain, diarrhea, constipation, blood in stool and abdominal distention.  Genitourinary: Denies dysuria, urgency, frequency, hematuria, flank pain and difficulty urinating.  Endocrine: Denies: hot or cold intolerance, sweats, changes in hair or nails,  polyuria, polydipsia. Musculoskeletal: Denies myalgias, back pain, joint swelling, arthralgias and gait problem.  Skin: Denies pallor, rash and wound.  Neurological: Denies dizziness, seizures, syncope, weakness, light-headedness, numbness and headaches.  Hematological: Denies adenopathy. Easy bruising, personal or family bleeding history  Psychiatric/Behavioral: Denies suicidal ideation, mood changes, confusion, nervousness, sleep disturbance and agitation    Physical Exam: Vitals:   04/19/20 0725  BP: (!) 150/90  Pulse: 75  Temp: 98.6 F (37 C)  TempSrc: Oral  SpO2: 97%  Weight: 249 lb 11.2 oz (113.3 kg)    Body mass index is 34.58 kg/m.   Constitutional: NAD, calm, comfortable Eyes: PERRL, lids and conjunctivae normal, wears corrective lenses ENMT: Mucous membranes are moist. Respiratory: clear to auscultation bilaterally, no wheezing, no crackles. Normal respiratory effort. No accessory muscle use.  Cardiovascular: Regular rate and rhythm, no murmurs / rubs / gallops. No extremity edema.  Neurologic: Grossly intact and nonfocal Psychiatric: Normal judgment and insight. Alert and oriented x 3. Normal mood.    Impression and Plan:  Diabetes mellitus without complication (H[facility]) -Uncontrolled with an A1c of 7.8 today. -Continue Metformin 500 mg twice daily, continue glipizide 5 mg daily. -Start semaglutide 0.25 mg weekly for a month and then increasing dose to 0.5 mg weekly. -We have done first injection in office today for teaching.  He is well versed in administration of semaglutide. -He will work is on healthy eating and continued lifestyle modifications. -Recheck A1c at follow-up in 3 months.  Primary hypertension  -Uncontrolled. -Increase amlodipine from 5 to 10 mg, continue losartan 100 and hydrochlorothiazide 25 mg daily. -He will do ambulatory blood pressure monitoring and return in 3 months for follow-up.  Dyslipidemia  - Plan: pravastatin (PRAVACHOL) 80 MG  tablet -Check fasting lipids when he returns in 3 months.  Insomnia, unspecified type/mild depression  - Plan: buPROPion (WELLBUTRIN XL) 150 MG 24 hr tablet -Mood is stable.  Gastroesophageal reflux disease without esophagitis  - Plan: omeprazole (PRILOSEC) 40 MG capsule    Patient Instructions  -Nice seeing you today!!  -Increase amlodipine to 10 mg daily.  -Start Ozempic 0.25 weekly on Wednesday's for 4 weeks and then 0.5 weekly.  -Schedule follow up in 3 months. Come in fasting for labs.     Chaya Jan, MD [facility]

## 2020-05-07 ENCOUNTER — Encounter: Payer: Self-pay | Admitting: Internal Medicine

## 2020-05-07 DIAGNOSIS — K219 Gastro-esophageal reflux disease without esophagitis: Secondary | ICD-10-CM

## 2020-05-08 ENCOUNTER — Other Ambulatory Visit: Payer: Self-pay | Admitting: Internal Medicine

## 2020-05-08 MED ORDER — OMEPRAZOLE 40 MG PO CPDR: 40.0000 mg | DELAYED_RELEASE_CAPSULE | Freq: Every day | ORAL | 1 refills | Status: DC

## 2020-07-09 ENCOUNTER — Encounter: Payer: Self-pay | Admitting: Internal Medicine

## 2020-07-09 DIAGNOSIS — I10 Essential (primary) hypertension: Secondary | ICD-10-CM

## 2020-07-09 DIAGNOSIS — E785 Hyperlipidemia, unspecified: Secondary | ICD-10-CM

## 2020-07-09 DIAGNOSIS — K219 Gastro-esophageal reflux disease without esophagitis: Secondary | ICD-10-CM

## 2020-07-11 ENCOUNTER — Other Ambulatory Visit: Payer: Self-pay | Admitting: Internal Medicine

## 2020-07-11 MED ORDER — LOSARTAN POTASSIUM-HCTZ 100-25 MG PO TABS: 1.0000 | ORAL_TABLET | Freq: Every day | ORAL | 1 refills | Status: DC

## 2020-07-11 MED ORDER — PRAVASTATIN SODIUM 80 MG PO TABS: 80.0000 mg | ORAL_TABLET | Freq: Every day | ORAL | 1 refills | Status: DC

## 2020-07-11 MED ORDER — OMEPRAZOLE 40 MG PO CPDR: 40.0000 mg | DELAYED_RELEASE_CAPSULE | Freq: Every day | ORAL | 1 refills | Status: DC

## 2020-08-18 ENCOUNTER — Other Ambulatory Visit: Payer: Self-pay | Admitting: Internal Medicine

## 2020-08-18 ENCOUNTER — Ambulatory Visit (INDEPENDENT_AMBULATORY_CARE_PROVIDER_SITE_OTHER): Payer: No Typology Code available for payment source | Admitting: Internal Medicine

## 2020-08-18 ENCOUNTER — Other Ambulatory Visit: Payer: Self-pay

## 2020-08-18 VITALS — BP 150/100 | HR 88 | Temp 98.5°F | Wt 241.4 lb

## 2020-08-18 DIAGNOSIS — I10 Essential (primary) hypertension: Secondary | ICD-10-CM

## 2020-08-18 DIAGNOSIS — G4700 Insomnia, unspecified: Secondary | ICD-10-CM

## 2020-08-18 DIAGNOSIS — E119 Type 2 diabetes mellitus without complications: Secondary | ICD-10-CM

## 2020-08-18 DIAGNOSIS — E785 Hyperlipidemia, unspecified: Secondary | ICD-10-CM | POA: Diagnosis not present

## 2020-08-18 DIAGNOSIS — F339 Major depressive disorder, recurrent, unspecified: Secondary | ICD-10-CM

## 2020-08-18 LAB — CBC WITH DIFFERENTIAL/PLATELET
Basophils Absolute: 0 10*3/uL (ref 0.0–0.1)
Basophils Relative: 0.3 % (ref 0.0–3.0)
Eosinophils Absolute: 0.1 10*3/uL (ref 0.0–0.7)
Eosinophils Relative: 1.1 % (ref 0.0–5.0)
HCT: 40.4 % (ref 39.0–52.0)
Hemoglobin: 13.1 g/dL (ref 13.0–17.0)
Lymphocytes Relative: 42.6 % (ref 12.0–46.0)
Lymphs Abs: 2.2 10*3/uL (ref 0.7–4.0)
MCHC: 32.3 g/dL (ref 30.0–36.0)
MCV: 69.6 fl — ABNORMAL LOW (ref 78.0–100.0)
Monocytes Absolute: 0.3 10*3/uL (ref 0.1–1.0)
Monocytes Relative: 6.4 % (ref 3.0–12.0)
Neutro Abs: 2.5 10*3/uL (ref 1.4–7.7)
Neutrophils Relative %: 49.6 % (ref 43.0–77.0)
Platelets: 293 10*3/uL (ref 150.0–400.0)
RBC: 5.8 Mil/uL (ref 4.22–5.81)
RDW: 15.3 % (ref 11.5–15.5)
WBC: 5.1 10*3/uL (ref 4.0–10.5)

## 2020-08-18 LAB — LIPID PANEL
Cholesterol: 138 mg/dL (ref 0–200)
HDL: 50 mg/dL (ref >39.00)
LDL Cholesterol: 70 mg/dL (ref 0–99)
NonHDL: 87.92
Total CHOL/HDL Ratio: 3
Triglycerides: 88 mg/dL (ref 0.0–149.0)
VLDL: 17.6 mg/dL (ref 0.0–40.0)

## 2020-08-18 LAB — COMPREHENSIVE METABOLIC PANEL
ALT: 29 U/L (ref 0–53)
AST: 18 U/L (ref 0–37)
Albumin: 4.6 g/dL (ref 3.5–5.2)
Alkaline Phosphatase: 57 U/L (ref 39–117)
BUN: 12 mg/dL (ref 6–23)
CO2: 29 mEq/L (ref 19–32)
Calcium: 10 mg/dL (ref 8.4–10.5)
Chloride: 98 mEq/L (ref 96–112)
Creatinine, Ser: 0.98 mg/dL (ref 0.40–1.50)
GFR: 93.81 mL/min (ref >60.00)
Glucose, Bld: 86 mg/dL (ref 70–99)
Potassium: 3.9 mEq/L (ref 3.5–5.1)
Sodium: 140 mEq/L (ref 135–145)
Total Bilirubin: 0.3 mg/dL (ref 0.2–1.2)
Total Protein: 7.5 g/dL (ref 6.0–8.3)

## 2020-08-18 LAB — POCT GLYCOSYLATED HEMOGLOBIN (HGB A1C): Hemoglobin A1C: 6.9 % — AB (ref 4.0–5.6)

## 2020-08-18 LAB — VITAMIN B12: Vitamin B-12: 473 pg/mL (ref 211–911)

## 2020-08-18 LAB — VITAMIN D 25 HYDROXY (VIT D DEFICIENCY, FRACTURES): VITD: 62.2 ng/mL (ref 30.00–100.00)

## 2020-08-18 LAB — TSH: TSH: 1.78 u[IU]/mL (ref 0.35–4.50)

## 2020-08-18 MED ORDER — BUPROPION HCL ER (XL) 300 MG PO TB24
300.0000 mg | ORAL_TABLET | Freq: Every day | ORAL | 1 refills | Status: DC
Start: 2020-08-18 — End: 2020-08-18

## 2020-08-18 NOTE — Patient Instructions (Signed)
-  Nice seeing you today!!  -Lab work today; will notify you once results are available.  -Increase Wellbutrin to 300 mg daily.  -Schedule therapy sessions.  -Schedule follow up with me in 3 months.

## 2020-08-18 NOTE — Progress Notes (Signed)
Established Patient Office Visit     This visit occurred during the SARS-CoV-2 public health emergency.  Safety protocols were in place, including screening questions prior to the visit, additional usage of staff PPE, and extensive cleaning of exam room while observing appropriate contact time as indicated for disinfecting solutions.    CC/Reason for Visit: Follow-up chronic conditions  HPI: Reginald Kirby. is a 45 y.o. male who is coming in today for the above mentioned reasons. Past Medical History is significant for: Hypertension, type 2 diabetes, hyperlipidemia, obesity, insomnia, depression.  At last visit semaglutide was added to glipizide and metformin due to an A1c of 7.8.  He has tolerated medication well.  He has lost 9 pounds since then.  His blood pressure is elevated today in office but he states that home measurements have been around [birth year]1928 systolic and 60-70 diastolic.  His cholesterol medication was increased last time due to an LDL above goal at 86, he is fasting today and would like his lipids rechecked.  His main issue today is depression.  He has scored quite high on PHQ-9, 20.  He lacks energy and motivation to do things.  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  He also has some social and family stressors.  He would like to know next steps.   Past Medical/Surgical History: Past Medical History:  Diagnosis Date  . Anxiety   . Arthritis    DDD  BACK  . Depression   . Diabetes mellitus   . GERD (gastroesophageal reflux disease)   . Headache    HX MIGRAINES   . Hyperlipidemia   . Hypertension   . Sinusitis     Past Surgical History:  Procedure Laterality Date  . CARDIAC CATHETERIZATION     2006    OKAY   . epidural injections   2002  . LUMBAR LAMINECTOMY/DECOMPRESSION MICRODISCECTOMY N/A 10/13/2015   Procedure: L3-4 L4-5 L5-S1 Laminectomy;  Surgeon: Barnett Abu, MD;  Location: [facility];  Service: Neurosurgery;  Laterality: N/A;  L3-4 L4-5 L5-S1 Laminectomy    Social History:  reports that he has never smoked. He has never used smokeless tobacco. He reports current alcohol use. He reports that he does not use drugs.  Allergies: Allergies  Allergen Reactions  . Demerol Itching    Family History:  Family History  Problem Relation Age of Onset  . Colon cancer Father   . Heart disease Mother        has pacemaker  . Diabetes Brother      Current Outpatient Medications:  .  amLODipine (NORVASC) 10 MG tablet, Take 1 tablet (10 mg total) by mouth daily., Disp: 90 tablet, Rfl: 1 .  aspirin EC 81 MG tablet, Take 1 tablet (81 mg total) by mouth daily., Disp: , Rfl:  .  glipiZIDE (GLIPIZIDE XL) 5 MG 24 hr tablet, Take 1 tablet (5 mg total) by mouth daily with breakfast., Disp: 90 tablet, Rfl: 1 .  glucose blood (FREESTYLE LITE) test strip, 1 each by Other route daily. Dx E11.9, Disp: 100 each, Rfl: 3 .  losartan-hydrochlorothiazide (HYZAAR) 100-25 MG tablet, Take 1 tablet by mouth daily., Disp: 90 tablet, Rfl: 1 .  metFORMIN (GLUCOPHAGE) 500 MG tablet, Take 1 tablet (500 mg total) by mouth 2 (two) times daily with a meal., Disp: 180 tablet, Rfl: 1 .  omeprazole (PRILOSEC) 40 MG capsule, Take 1 capsule (40 mg total) by  mouth daily., Disp: 90 capsule, Rfl: 1 .  pravastatin (PRAVACHOL) 80 MG tablet, Take 1 tablet (80 mg total) by mouth daily., Disp: 90 tablet, Rfl: 1 .  Semaglutide,0.25 or 0.5MG /DOS, (OZEMPIC, 0.25 OR 0.5 MG/DOSE,) 2 MG/1.5ML SOPN, Inject 0.5 mg into the skin once a week., Disp: 1.5 mL, Rfl: 3 .  buPROPion (WELLBUTRIN XL) 300 MG 24 hr tablet, Take 1 tablet (300 mg total) by mouth daily., Disp: 90 tablet, Rfl: 1  Review of Systems:  Constitutional: Denies fever, chills, diaphoresis, appetite change. HEENT: Denies photophobia, eye pain, redness, hearing loss, ear pain, congestion, sore throat, rhinorrhea, sneezing, mouth sores, trouble swallowing, neck pain,  neck stiffness and tinnitus.   Respiratory: Denies SOB, DOE, cough, chest tightness,  and wheezing.   Cardiovascular: Denies chest pain, palpitations and leg swelling.  Gastrointestinal: Denies nausea, vomiting, abdominal pain, diarrhea, constipation, blood in stool and abdominal distention.  Genitourinary: Denies dysuria, urgency, frequency, hematuria, flank pain and difficulty urinating.  Endocrine: Denies: hot or cold intolerance, sweats, changes in hair or nails, polyuria, polydipsia. Musculoskeletal: Denies myalgias, back pain, joint swelling, arthralgias and gait problem.  Skin: Denies pallor, rash and wound.  Neurological: Denies dizziness, seizures, syncope, weakness, light-headedness, numbness and headaches.  Hematological: Denies adenopathy. Easy bruising, personal or family bleeding history  Psychiatric/Behavioral: Denies suicidal ideation, positive for mood changes,  nervousness, sleep disturbance.    Physical Exam: Vitals:   08/18/20 1326  BP: (!) 150/100  Pulse: 88  Temp: 98.5 F (36.9 C)  TempSrc: Oral  SpO2: 99%  Weight: 241 lb 6.4 oz (109.5 kg)    Body mass index is 33.43 kg/m.   Constitutional: NAD, calm, comfortable Eyes: PERRL, lids and conjunctivae normal, wears corrective lenses ENMT: Mucous membranes are moist. Respiratory: clear to auscultation bilaterally, no wheezing, no crackles. Normal respiratory effort. No accessory muscle use.  Cardiovascular: Regular rate and rhythm, no murmurs / rubs / gallops. No extremity edema.  Neurologic: Grossly intact and nonfocal. Psychiatric: Normal judgment and insight. Alert and oriented x 3. Normal mood.    Impression and Plan:  Diabetes mellitus without complication (HCC)  -Well-controlled with an A1c of 6.9 today.  Dyslipidemia  - Plan: Lipid panel -Last LDL was slightly above goal at 86 with a goal LDL of less than 70.  Primary hypertension  -Blood pressure is quite elevated today in office, however his  wife states that blood pressures at home have been well within range.  His wife is an Char[employer] fundraiser. -He is currently on maximum doses of losartan and hydrochlorothiazide in addition to maximum doses of this.  Depression, recurrent (HCC)  - Plan: TSH, Vitamin B12, VITAMIN D 25 Hydroxy (Vit-D Deficiency, Fractures) -He has scored a 20 on his PHQ-9 today. -I will increase his Wellbutrin from 150 to 300 mg, he will also be referred to psychotherapy.  He does not have suicidal intent.     Patient Instructions  -Nice seeing you today!!  -Lab work today; will notify you once results are available.  -Increase Wellbutrin to 300 mg daily.  -Schedule therapy sessions.  -Schedule follow up with me in 3 months.     Chaya Jan, MD [facility]

## 2020-08-27 ENCOUNTER — Encounter: Payer: Self-pay | Admitting: Internal Medicine

## 2020-08-27 DIAGNOSIS — I10 Essential (primary) hypertension: Secondary | ICD-10-CM

## 2020-08-29 ENCOUNTER — Other Ambulatory Visit: Payer: Self-pay | Admitting: Internal Medicine

## 2020-08-29 MED ORDER — LOSARTAN POTASSIUM-HCTZ 100-25 MG PO TABS: 1.0000 | ORAL_TABLET | Freq: Every day | ORAL | 1 refills | Status: DC

## 2020-08-29 NOTE — Addendum Note (Signed)
Addended by: Kern Reap B on: 08/29/2020 09:57 AM   Modules accepted: Orders

## 2020-09-01 LAB — HM DIABETES EYE EXAM

## 2020-09-05 ENCOUNTER — Encounter: Payer: Self-pay | Admitting: Internal Medicine

## 2020-09-13 ENCOUNTER — Other Ambulatory Visit: Payer: Self-pay | Admitting: Internal Medicine

## 2020-09-13 ENCOUNTER — Encounter: Payer: Self-pay | Admitting: Internal Medicine

## 2020-09-13 DIAGNOSIS — E119 Type 2 diabetes mellitus without complications: Secondary | ICD-10-CM

## 2020-09-13 MED ORDER — OZEMPIC (0.25 OR 0.5 MG/DOSE) 2 MG/1.5ML ~~LOC~~ SOPN: 0.5000 mg | PEN_INJECTOR | SUBCUTANEOUS | 3 refills | Status: DC

## 2020-09-16 ENCOUNTER — Other Ambulatory Visit (HOSPITAL_COMMUNITY): Payer: Self-pay

## 2020-10-05 MED FILL — Losartan Potassium & Hydrochlorothiazide Tab 100-25 MG: ORAL | 14 days supply | Qty: 14 | Fill #0 | Status: AC

## 2020-10-06 ENCOUNTER — Other Ambulatory Visit (HOSPITAL_COMMUNITY): Payer: Self-pay

## 2020-10-06 MED FILL — Losartan Potassium & Hydrochlorothiazide Tab 100-25 MG: ORAL | 76 days supply | Qty: 76 | Fill #0 | Status: AC

## 2020-10-08 ENCOUNTER — Encounter: Payer: Self-pay | Admitting: Internal Medicine

## 2020-10-08 DIAGNOSIS — E119 Type 2 diabetes mellitus without complications: Secondary | ICD-10-CM

## 2020-10-08 DIAGNOSIS — E785 Hyperlipidemia, unspecified: Secondary | ICD-10-CM

## 2020-10-08 DIAGNOSIS — I10 Essential (primary) hypertension: Secondary | ICD-10-CM

## 2020-10-09 ENCOUNTER — Other Ambulatory Visit (HOSPITAL_COMMUNITY): Payer: Self-pay

## 2020-10-09 MED ORDER — PRAVASTATIN SODIUM 80 MG PO TABS
80.0000 mg | ORAL_TABLET | Freq: Every day | ORAL | 1 refills | Status: DC
  Filled 2020-10-09 – 2021-01-23 (×2): qty 90, 90d supply, fill #0

## 2020-10-09 MED ORDER — AMLODIPINE BESYLATE 10 MG PO TABS
10.0000 mg | ORAL_TABLET | Freq: Every day | ORAL | 1 refills | Status: DC
  Filled 2020-10-09: qty 90, 90d supply, fill #0
  Filled 2020-12-24: qty 90, 90d supply, fill #1

## 2020-10-09 MED ORDER — METFORMIN HCL 500 MG PO TABS
500.0000 mg | ORAL_TABLET | Freq: Two times a day (BID) | ORAL | 1 refills | Status: DC
  Filled 2020-10-09: qty 180, 90d supply, fill #0
  Filled 2021-01-03: qty 180, 90d supply, fill #1
  Filled 2021-01-05: qty 180, 90d supply, fill #0

## 2020-10-11 MED FILL — Semaglutide Soln Pen-inj 0.25 or 0.5 MG/DOSE (2 MG/1.5ML): SUBCUTANEOUS | 28 days supply | Qty: 1.5 | Fill #0 | Status: AC

## 2020-10-12 ENCOUNTER — Other Ambulatory Visit (HOSPITAL_COMMUNITY): Payer: Self-pay

## 2020-10-29 ENCOUNTER — Other Ambulatory Visit: Payer: Self-pay | Admitting: Internal Medicine

## 2020-10-29 DIAGNOSIS — E119 Type 2 diabetes mellitus without complications: Secondary | ICD-10-CM

## 2020-10-29 MED FILL — Omeprazole Cap Delayed Release 40 MG: ORAL | 90 days supply | Qty: 90 | Fill #0 | Status: AC

## 2020-10-30 ENCOUNTER — Other Ambulatory Visit (HOSPITAL_COMMUNITY): Payer: Self-pay

## 2020-10-30 ENCOUNTER — Encounter: Payer: Self-pay | Admitting: Internal Medicine

## 2020-10-30 DIAGNOSIS — E119 Type 2 diabetes mellitus without complications: Secondary | ICD-10-CM

## 2020-10-31 ENCOUNTER — Other Ambulatory Visit (HOSPITAL_COMMUNITY): Payer: Self-pay

## 2020-10-31 MED ORDER — GLIPIZIDE ER 5 MG PO TB24
5.0000 mg | ORAL_TABLET | Freq: Every day | ORAL | 1 refills | Status: DC
  Filled 2020-10-31 – 2021-02-04 (×2): qty 90, 90d supply, fill #0

## 2020-11-06 ENCOUNTER — Other Ambulatory Visit (HOSPITAL_BASED_OUTPATIENT_CLINIC_OR_DEPARTMENT_OTHER): Payer: Self-pay

## 2020-11-09 ENCOUNTER — Other Ambulatory Visit (HOSPITAL_COMMUNITY): Payer: Self-pay

## 2020-11-09 MED FILL — Semaglutide Soln Pen-inj 0.25 or 0.5 MG/DOSE (2 MG/1.5ML): SUBCUTANEOUS | 28 days supply | Qty: 1.5 | Fill #1 | Status: AC

## 2020-12-06 MED FILL — Semaglutide Soln Pen-inj 0.25 or 0.5 MG/DOSE (2 MG/1.5ML): SUBCUTANEOUS | 28 days supply | Qty: 1.5 | Fill #2 | Status: AC

## 2020-12-07 ENCOUNTER — Other Ambulatory Visit (HOSPITAL_COMMUNITY): Payer: Self-pay

## 2020-12-12 ENCOUNTER — Other Ambulatory Visit (HOSPITAL_COMMUNITY): Payer: Self-pay

## 2020-12-12 MED FILL — Bupropion HCl Tab ER 24HR 300 MG: ORAL | 90 days supply | Qty: 90 | Fill #0 | Status: AC

## 2020-12-24 MED FILL — Losartan Potassium & Hydrochlorothiazide Tab 100-25 MG: ORAL | 60 days supply | Qty: 60 | Fill #1 | Status: AC

## 2020-12-25 ENCOUNTER — Other Ambulatory Visit (HOSPITAL_COMMUNITY): Payer: Self-pay

## 2020-12-26 ENCOUNTER — Other Ambulatory Visit (HOSPITAL_COMMUNITY): Payer: Self-pay

## 2021-01-03 ENCOUNTER — Encounter: Payer: Self-pay | Admitting: Internal Medicine

## 2021-01-03 ENCOUNTER — Other Ambulatory Visit: Payer: Self-pay | Admitting: Internal Medicine

## 2021-01-03 DIAGNOSIS — E119 Type 2 diabetes mellitus without complications: Secondary | ICD-10-CM

## 2021-01-04 ENCOUNTER — Other Ambulatory Visit (HOSPITAL_COMMUNITY): Payer: Self-pay

## 2021-01-04 MED ORDER — OZEMPIC (0.25 OR 0.5 MG/DOSE) 2 MG/1.5ML ~~LOC~~ SOPN
0.5000 mg | PEN_INJECTOR | SUBCUTANEOUS | 3 refills | Status: DC
  Filled 2021-01-04 – 2021-01-05 (×2): qty 1.5, 28d supply, fill #0
  Filled 2021-02-28: qty 4.5, 84d supply, fill #1

## 2021-01-05 ENCOUNTER — Ambulatory Visit: Payer: No Typology Code available for payment source

## 2021-01-05 ENCOUNTER — Other Ambulatory Visit (HOSPITAL_COMMUNITY): Payer: Self-pay

## 2021-01-05 ENCOUNTER — Other Ambulatory Visit (HOSPITAL_BASED_OUTPATIENT_CLINIC_OR_DEPARTMENT_OTHER): Payer: Self-pay

## 2021-01-24 ENCOUNTER — Other Ambulatory Visit (HOSPITAL_BASED_OUTPATIENT_CLINIC_OR_DEPARTMENT_OTHER): Payer: Self-pay

## 2021-02-02 ENCOUNTER — Encounter: Payer: Self-pay | Admitting: Internal Medicine

## 2021-02-04 ENCOUNTER — Other Ambulatory Visit: Payer: Self-pay | Admitting: Internal Medicine

## 2021-02-04 DIAGNOSIS — K219 Gastro-esophageal reflux disease without esophagitis: Secondary | ICD-10-CM

## 2021-02-05 ENCOUNTER — Other Ambulatory Visit (HOSPITAL_BASED_OUTPATIENT_CLINIC_OR_DEPARTMENT_OTHER): Payer: Self-pay

## 2021-02-06 ENCOUNTER — Other Ambulatory Visit: Payer: Self-pay | Admitting: Internal Medicine

## 2021-02-06 ENCOUNTER — Other Ambulatory Visit (HOSPITAL_BASED_OUTPATIENT_CLINIC_OR_DEPARTMENT_OTHER): Payer: Self-pay

## 2021-02-06 ENCOUNTER — Telehealth: Payer: No Typology Code available for payment source | Admitting: Physician Assistant

## 2021-02-06 DIAGNOSIS — K219 Gastro-esophageal reflux disease without esophagitis: Secondary | ICD-10-CM

## 2021-02-06 DIAGNOSIS — M545 Low back pain, unspecified: Secondary | ICD-10-CM

## 2021-02-06 MED ORDER — PREDNISONE 10 MG PO TABS
ORAL_TABLET | ORAL | 0 refills | Status: DC
  Filled 2021-02-06: qty 21, 6d supply, fill #0

## 2021-02-06 MED ORDER — NAPROXEN 500 MG PO TABS
500.0000 mg | ORAL_TABLET | Freq: Two times a day (BID) | ORAL | 0 refills | Status: DC
  Filled 2021-02-06: qty 20, 10d supply, fill #0

## 2021-02-06 MED ORDER — OMEPRAZOLE 40 MG PO CPDR
DELAYED_RELEASE_CAPSULE | Freq: Every day | ORAL | 1 refills | Status: DC
  Filled 2021-02-06: qty 90, 90d supply, fill #0
  Filled 2021-05-06: qty 90, 90d supply, fill #1

## 2021-02-06 MED ORDER — CYCLOBENZAPRINE HCL 10 MG PO TABS
10.0000 mg | ORAL_TABLET | Freq: Three times a day (TID) | ORAL | 0 refills | Status: DC | PRN
  Filled 2021-02-06: qty 15, 5d supply, fill #0

## 2021-02-06 MED ORDER — CYCLOBENZAPRINE HCL 10 MG PO TABS
10.0000 mg | ORAL_TABLET | Freq: Three times a day (TID) | ORAL | 0 refills | Status: DC | PRN
Start: 2021-02-06 — End: 2021-02-06
  Filled 2021-02-06: qty 15, 5d supply, fill #0

## 2021-02-06 NOTE — Progress Notes (Signed)

## 2021-02-06 NOTE — Progress Notes (Signed)
I have spent 5 minutes in review of e-visit questionnaire, review and updating patient chart, medical decision making and response to patient.   Terrall Bley Cody Kennita Pavlovich, PA-C    

## 2021-02-25 ENCOUNTER — Other Ambulatory Visit: Payer: Self-pay | Admitting: Internal Medicine

## 2021-02-25 DIAGNOSIS — I10 Essential (primary) hypertension: Secondary | ICD-10-CM

## 2021-02-25 DIAGNOSIS — G4700 Insomnia, unspecified: Secondary | ICD-10-CM

## 2021-02-26 ENCOUNTER — Other Ambulatory Visit (HOSPITAL_BASED_OUTPATIENT_CLINIC_OR_DEPARTMENT_OTHER): Payer: Self-pay

## 2021-02-27 ENCOUNTER — Encounter: Payer: Self-pay | Admitting: Internal Medicine

## 2021-02-27 ENCOUNTER — Other Ambulatory Visit (HOSPITAL_BASED_OUTPATIENT_CLINIC_OR_DEPARTMENT_OTHER): Payer: Self-pay

## 2021-02-27 MED ORDER — LOSARTAN POTASSIUM-HCTZ 100-25 MG PO TABS
1.0000 | ORAL_TABLET | Freq: Every day | ORAL | 1 refills | Status: DC
  Filled 2021-02-27: qty 90, 90d supply, fill #0
  Filled 2021-04-25 – 2021-05-24 (×2): qty 90, 90d supply, fill #1

## 2021-02-27 MED ORDER — BUPROPION HCL ER (XL) 300 MG PO TB24
ORAL_TABLET | Freq: Every day | ORAL | 1 refills | Status: DC
  Filled 2021-02-27: qty 90, 90d supply, fill #0
  Filled 2021-04-25 – 2021-05-28 (×2): qty 90, 90d supply, fill #1

## 2021-02-28 ENCOUNTER — Other Ambulatory Visit (HOSPITAL_BASED_OUTPATIENT_CLINIC_OR_DEPARTMENT_OTHER): Payer: Self-pay

## 2021-03-09 ENCOUNTER — Ambulatory Visit: Payer: No Typology Code available for payment source | Attending: Internal Medicine

## 2021-03-09 ENCOUNTER — Other Ambulatory Visit (HOSPITAL_BASED_OUTPATIENT_CLINIC_OR_DEPARTMENT_OTHER): Payer: Self-pay

## 2021-03-09 DIAGNOSIS — Z23 Encounter for immunization: Secondary | ICD-10-CM

## 2021-03-09 MED ORDER — PFIZER COVID-19 VAC BIVALENT 30 MCG/0.3ML IM SUSP
INTRAMUSCULAR | 0 refills | Status: DC
  Filled 2021-03-09: qty 0.3, 1d supply, fill #0

## 2021-03-09 NOTE — Progress Notes (Signed)
   [id number]  Name:  Reginald Kirby.    MRN: [id number] DOB: Mar 17, 1976  03/09/2021  [name] was observed post Covid-19 immunization for 15 minutes without incident. He was provided with Vaccine Information Sheet and instruction to access the V-Safe system.   [name] was instructed to call 911 with any severe reactions post vaccine: Difficulty breathing  Swelling of face and throat  A fast heartbeat  A bad rash all over body  Dizziness and weakness

## 2021-03-16 ENCOUNTER — Ambulatory Visit: Payer: No Typology Code available for payment source

## 2021-04-05 ENCOUNTER — Other Ambulatory Visit: Payer: Self-pay | Admitting: Internal Medicine

## 2021-04-05 ENCOUNTER — Other Ambulatory Visit (HOSPITAL_BASED_OUTPATIENT_CLINIC_OR_DEPARTMENT_OTHER): Payer: Self-pay

## 2021-04-05 DIAGNOSIS — I10 Essential (primary) hypertension: Secondary | ICD-10-CM

## 2021-04-05 MED ORDER — AMLODIPINE BESYLATE 10 MG PO TABS
10.0000 mg | ORAL_TABLET | Freq: Every day | ORAL | 0 refills | Status: DC
  Filled 2021-04-05: qty 90, 90d supply, fill #0

## 2021-04-08 ENCOUNTER — Other Ambulatory Visit: Payer: Self-pay | Admitting: Internal Medicine

## 2021-04-08 DIAGNOSIS — E119 Type 2 diabetes mellitus without complications: Secondary | ICD-10-CM

## 2021-04-09 ENCOUNTER — Other Ambulatory Visit: Payer: Self-pay | Admitting: Internal Medicine

## 2021-04-09 ENCOUNTER — Other Ambulatory Visit (HOSPITAL_BASED_OUTPATIENT_CLINIC_OR_DEPARTMENT_OTHER): Payer: Self-pay

## 2021-04-09 DIAGNOSIS — E119 Type 2 diabetes mellitus without complications: Secondary | ICD-10-CM

## 2021-04-09 MED ORDER — METFORMIN HCL 500 MG PO TABS
500.0000 mg | ORAL_TABLET | Freq: Two times a day (BID) | ORAL | 0 refills | Status: DC
  Filled 2021-04-09 – 2021-05-01 (×2): qty 180, 90d supply, fill #0

## 2021-04-09 MED ORDER — GLIPIZIDE ER 5 MG PO TB24
5.0000 mg | ORAL_TABLET | Freq: Every day | ORAL | 0 refills | Status: DC
  Filled 2021-04-09 – 2021-05-01 (×2): qty 90, 90d supply, fill #0

## 2021-04-10 ENCOUNTER — Other Ambulatory Visit (HOSPITAL_BASED_OUTPATIENT_CLINIC_OR_DEPARTMENT_OTHER): Payer: Self-pay

## 2021-04-10 ENCOUNTER — Telehealth: Payer: No Typology Code available for payment source | Admitting: Physician Assistant

## 2021-04-10 DIAGNOSIS — J069 Acute upper respiratory infection, unspecified: Secondary | ICD-10-CM

## 2021-04-10 MED ORDER — FLUTICASONE PROPIONATE 50 MCG/ACT NA SUSP
2.0000 | Freq: Every day | NASAL | 0 refills | Status: DC
  Filled 2021-04-10: qty 16, 30d supply, fill #0

## 2021-04-10 MED ORDER — BENZONATATE 100 MG PO CAPS
100.0000 mg | ORAL_CAPSULE | Freq: Three times a day (TID) | ORAL | 0 refills | Status: DC | PRN
  Filled 2021-04-10: qty 30, 10d supply, fill #0

## 2021-04-10 NOTE — Progress Notes (Signed)
I have spent 5 minutes in review of e-visit questionnaire, review and updating patient chart, medical decision making and response to patient.   Kinsley Nicklaus Cody Aquil Duhe, PA-C    

## 2021-04-10 NOTE — Progress Notes (Signed)
E-Visit for Upper Respiratory Infection   We are sorry you are not feeling well.  Here is how we plan to help!  Based on what you have shared with me, it looks like you may have a viral upper respiratory infection.  Upper respiratory infections are caused by a large number of viruses; however, rhinovirus is the most common cause. Current symptoms do not seem consistent with influenza, especially giving lack of fever itself. You are outside the window for antiviral medication for influenza even if that were the cause of symptoms so we treat with supportive measures to help calm symptoms down while your body takes care of the actual infection. I do not think it is unreasonable to repeat a COVID test if initial one was a home/rapid test, just to be cautious. I recommend this be done at least 24 hours after prior test. If positive, please let us know. Otherwise follow the recommendations below.  Symptoms vary from person to person, with common symptoms including sore throat, cough, fatigue or lack of energy and feeling of general discomfort.  A low-grade fever of up to 100.4 may present, but is often uncommon.  Symptoms vary however, and are closely related to a person's age or underlying illnesses.  The most common symptoms associated with an upper respiratory infection are nasal discharge or congestion, cough, sneezing, headache and pressure in the ears and face.  These symptoms usually persist for about 3 to 10 days, but can last up to 2 weeks.  It is important to know that upper respiratory infections do not cause serious illness or complications in most cases.    Upper respiratory infections can be transmitted from person to person, with the most common method of transmission being a person's hands.  The virus is able to live on the skin and can infect other persons for up to 2 hours after direct contact.  Also, these can be transmitted when someone coughs or sneezes; thus, it is important to cover the  mouth to reduce this risk.  To keep the spread of the illness at bay, good hand hygiene is very important.  This is an infection that is most likely caused by a virus. There are no specific treatments other than to help you with the symptoms until the infection runs its course.  We are sorry you are not feeling well.  Here is how we plan to help!   For nasal congestion, you may use an oral decongestants such as Mucinex D or if you have glaucoma or high blood pressure use plain Mucinex.  Saline nasal spray or nasal drops can help and can safely be used as often as needed for congestion.  For your congestion, I have prescribed Fluticasone nasal spray one spray in each nostril twice a day  If you do not have a history of heart disease, hypertension, diabetes or thyroid disease, prostate/bladder issues or glaucoma, you may also use Sudafed to treat nasal congestion.  It is highly recommended that you consult with a pharmacist or your primary care physician to ensure this medication is safe for you to take.     If you have a cough, you may use cough suppressants such as Delsym and Robitussin.  If you have glaucoma or high blood pressure, you can also use Coricidin HBP.   For cough I have prescribed for you A prescription cough medication called Tessalon Perles 100 mg. You may take 1-2 capsules every 8 hours as needed for cough  If you  have a sore or scratchy throat, use a saltwater gargle-  to  teaspoon of salt dissolved in a 4-ounce to 8-ounce glass of warm water.  Gargle the solution for approximately 15-30 seconds and then spit.  It is important not to swallow the solution.  You can also use throat lozenges/cough drops and Chloraseptic spray to help with throat pain or discomfort.  Warm or cold liquids can also be helpful in relieving throat pain.  For headache, pain or general discomfort, you can use Ibuprofen or Tylenol as directed.   Some authorities believe that zinc sprays or the use of Echinacea  may shorten the course of your symptoms.   HOME CARE Only take medications as instructed by your medical team. Be sure to drink plenty of fluids. Water is fine as well as fruit juices, sodas and electrolyte beverages. You may want to stay away from caffeine or alcohol. If you are nauseated, try taking small sips of liquids. How do you know if you are getting enough fluid? Your urine should be a pale yellow or almost colorless. Get rest. Taking a steamy shower or using a humidifier may help nasal congestion and ease sore throat pain. You can place a towel over your head and breathe in the steam from hot water coming from a faucet. Using a saline nasal spray works much the same way. Cough drops, hard candies and sore throat lozenges may ease your cough. Avoid close contacts especially the very young and the elderly Cover your mouth if you cough or sneeze Always remember to wash your hands.   GET HELP RIGHT AWAY IF: You develop worsening fever. If your symptoms do not improve within 10 days You develop yellow or green discharge from your nose over 3 days. You have coughing fits You develop a severe head ache or visual changes. You develop shortness of breath, difficulty breathing or start having chest pain Your symptoms persist after you have completed your treatment plan  MAKE SURE YOU  Understand these instructions. Will watch your condition. Will get help right away if you are not doing well or get worse.  Thank you for choosing an e-visit.  Your e-visit answers were reviewed by a board certified advanced clinical practitioner to complete your personal care plan. Depending upon the condition, your plan could have included both over the counter or prescription medications.  Please review your pharmacy choice. Make sure the pharmacy is open so you can pick up prescription now. If there is a problem, you may contact your provider through [employer] and have the prescription routed  to another pharmacy.  Your safety is important to [place]. If you have drug allergies check your prescription carefully.   For the next 24 hours you can use MyChart to ask questions about today's visit, request a non-urgent call back, or ask for a work or school excuse. You will get an email in the next two days asking about your experience. I hope that your e-visit has been valuable and will speed your recovery.

## 2021-04-19 ENCOUNTER — Other Ambulatory Visit: Payer: Self-pay

## 2021-04-20 ENCOUNTER — Ambulatory Visit (INDEPENDENT_AMBULATORY_CARE_PROVIDER_SITE_OTHER): Payer: No Typology Code available for payment source | Admitting: Internal Medicine

## 2021-04-20 ENCOUNTER — Encounter: Payer: Self-pay | Admitting: Internal Medicine

## 2021-04-20 ENCOUNTER — Other Ambulatory Visit (HOSPITAL_BASED_OUTPATIENT_CLINIC_OR_DEPARTMENT_OTHER): Payer: Self-pay

## 2021-04-20 VITALS — BP 140/90 | HR 110 | Temp 99.0°F | Ht 71.0 in | Wt 241.4 lb

## 2021-04-20 DIAGNOSIS — Z0000 Encounter for general adult medical examination without abnormal findings: Secondary | ICD-10-CM | POA: Diagnosis not present

## 2021-04-20 DIAGNOSIS — E1169 Type 2 diabetes mellitus with other specified complication: Secondary | ICD-10-CM

## 2021-04-20 DIAGNOSIS — I10 Essential (primary) hypertension: Secondary | ICD-10-CM

## 2021-04-20 DIAGNOSIS — E785 Hyperlipidemia, unspecified: Secondary | ICD-10-CM | POA: Diagnosis not present

## 2021-04-20 MED ORDER — METOPROLOL TARTRATE 25 MG PO TABS
12.5000 mg | ORAL_TABLET | Freq: Two times a day (BID) | ORAL | 1 refills | Status: DC
Start: 2021-04-20 — End: 2021-06-01
  Filled 2021-04-20: qty 90, 90d supply, fill #0
  Filled 2021-05-28 – 2021-05-30 (×2): qty 90, 90d supply, fill #1

## 2021-04-20 NOTE — Progress Notes (Signed)
Established Patient Office Visit     This visit occurred during the SARS-CoV-2 public health emergency.  Safety protocols were in place, including screening questions prior to the visit, additional usage of staff PPE, and extensive cleaning of exam room while observing appropriate contact time as indicated for disinfecting solutions.    CC/Reason for Visit: Annual preventive exam  HPI: Reginald Kirby. is a 45 y.o. male who is coming in today for the above mentioned reasons. Past Medical History is significant for: Hypertension, hyperlipidemia, type 2 diabetes, depression.  He feels well today and has no acute concerns.  He will be starting a new job in January.  He has routine eye and dental care.  He has recently had his flu vaccine and his COVID booster.  He had a colonoscopy in December 2018.  His blood pressure remains elevated.  At home measurements are systolics in the 130s to 100 140s and diastolics usually in the 80s.    Past Medical/Surgical History: Past Medical History:  Diagnosis Date   Anxiety    Arthritis    DDD  BACK   Depression    Diabetes mellitus    GERD (gastroesophageal reflux disease)    Headache    HX MIGRAINES    Hyperlipidemia    Hypertension    Sinusitis     Past Surgical History:  Procedure Laterality Date   CARDIAC CATHETERIZATION     2006    OKAY    epidural injections   2002   LUMBAR LAMINECTOMY/DECOMPRESSION MICRODISCECTOMY N/A 10/13/2015   Procedure: L3-4 L4-5 L5-S1 Laminectomy;  Surgeon: Barnett Abu, MD;  Location: [facility];  Service: Neurosurgery;  Laterality: N/A;  L3-4 L4-5 L5-S1 Laminectomy    Social History:  reports that he has never smoked. He has never used smokeless tobacco. He reports current alcohol use. He reports that he does not use drugs.  Allergies: Allergies  Allergen Reactions   Demerol Itching    Family History:  Family History  Problem Relation Age of Onset   Colon cancer Father    Heart disease  Mother        has pacemaker   Diabetes Brother      Current Outpatient Medications:    amLODipine (NORVASC) 10 MG tablet, Take 1 tablet (10 mg total) by mouth daily., Disp: 90 tablet, Rfl: 0   aspirin EC 81 MG tablet, Take 1 tablet (81 mg total) by mouth daily., Disp: , Rfl:    buPROPion (WELLBUTRIN XL) 300 MG 24 hr tablet, TAKE 1 TABLET (300 MG TOTAL) BY MOUTH DAILY., Disp: 90 tablet, Rfl: 1   cyclobenzaprine (FLEXERIL) 10 MG tablet, Take 1 tablet (10 mg total) by mouth 3 (three) times daily as needed for muscle spasms., Disp: 15 tablet, Rfl: 0   glipiZIDE (GLUCOTROL XL) 5 MG 24 hr tablet, Take 1 tablet (5 mg total) by mouth daily with breakfast., Disp: 90 tablet, Rfl: 0   glucose blood (FREESTYLE LITE) test strip, 1 each by Other route daily. Dx E11.9, Disp: 100 each, Rfl: 3   losartan-hydrochlorothiazide (HYZAAR) 100-25 MG tablet, TAKE 1 TABLET BY MOUTH ONCE DAILY., Disp: 90 tablet, Rfl: 1   metFORMIN (GLUCOPHAGE) 500 MG tablet, Take 1 tablet (500 mg total) by mouth 2 (two) times daily with a meal., Disp: 180 tablet, Rfl: 0   metoprolol tartrate (LOPRESSOR) 25 MG tablet, Take 0.5 tablets (12.5 mg total) by mouth 2 (two) times daily., Disp: 90 tablet, Rfl: 1   omeprazole (  PRILOSEC) 40 MG capsule, TAKE 1 CAPSULE (40 MG TOTAL) BY MOUTH DAILY., Disp: 90 capsule, Rfl: 1   pravastatin (PRAVACHOL) 80 MG tablet, Take 1 tablet (80 mg total) by mouth daily., Disp: 90 tablet, Rfl: 1   Semaglutide,0.25 or 0.5MG /DOS, (OZEMPIC, 0.25 OR 0.5 MG/DOSE,) 2 MG/1.5ML SOPN, INJECT 0.5 MG INTO THE SKIN ONCE A WEEK., Disp: 1.5 mL, Rfl: 3  Review of Systems:  Constitutional: Denies fever, chills, diaphoresis, appetite change and fatigue.  HEENT: Denies photophobia, eye pain, redness, hearing loss, ear pain, congestion, sore throat, rhinorrhea, sneezing, mouth sores, trouble swallowing, neck pain, neck stiffness and tinnitus.   Respiratory: Denies SOB, DOE, cough, chest tightness,  and wheezing.   Cardiovascular:  Denies chest pain, palpitations and leg swelling.  Gastrointestinal: Denies nausea, vomiting, abdominal pain, diarrhea, constipation, blood in stool and abdominal distention.  Genitourinary: Denies dysuria, urgency, frequency, hematuria, flank pain and difficulty urinating.  Endocrine: Denies: hot or cold intolerance, sweats, changes in hair or nails, polyuria, polydipsia. Musculoskeletal: Denies myalgias, back pain, joint swelling, arthralgias and gait problem.  Skin: Denies pallor, rash and wound.  Neurological: Denies dizziness, seizures, syncope, weakness, light-headedness, numbness and headaches.  Hematological: Denies adenopathy. Easy bruising, personal or family bleeding history  Psychiatric/Behavioral: Denies suicidal ideation, mood changes, confusion, nervousness and agitation    Physical Exam: Vitals:   04/20/21 1525  BP: 140/90  Pulse: (!) 110  Temp: 99 F (37.2 C)  TempSrc: Oral  SpO2: 96%  Weight: 241 lb 6.4 oz (109.5 kg)  Height: 5\' 11"  (1.803 m)    Body mass index is 33.67 kg/m.   Constitutional: NAD, calm, comfortable Eyes: PERRL, lids and conjunctivae normal ENMT: Mucous membranes are moist. Posterior pharynx clear of any exudate or lesions. Normal dentition. Tympanic membrane is pearly white, no erythema or bulging. Neck: normal, supple, no masses, no thyromegaly Respiratory: clear to auscultation bilaterally, no wheezing, no crackles. Normal respiratory effort. No accessory muscle use.  Cardiovascular: Regular rate and rhythm, no murmurs / rubs / gallops. No extremity edema. 2+ pedal pulses. No carotid bruits.  Abdomen: no tenderness, no masses palpated. No hepatosplenomegaly. Bowel sounds positive.  Musculoskeletal: no clubbing / cyanosis. No joint deformity upper and lower extremities. Good ROM, no contractures. Normal muscle tone.  Skin: no rashes, lesions, ulcers. No induration Neurologic: CN 2-12 grossly intact. Sensation intact, DTR normal. Strength 5/5  in all 4.  Psychiatric: Normal judgment and insight. Alert and oriented x 3. Normal mood.    Impression and Plan:  Encounter for preventive health examination  -Recommend routine eye and dental care. -Immunizations: All immunizations are up-to-date and age-appropriate -Healthy lifestyle discussed in detail. -Labs to be updated today. -Colon cancer screening: December 2018 -Breast cancer screening: Not applicable -Cervical cancer screening: Not applicable -Lung cancer screening: Not applicable -Prostate cancer screening: PSA today -DEXA: Not applicable  Primary hypertension  - Plan: metoprolol tartrate (LOPRESSOR) 25 MG tablet, PSA, TSH, Vitamin B12, VITAMIN D 25 Hydroxy (Vit-D Deficiency, Fractures) -Blood pressure is not well controlled today.  Continue amlodipine 10 mg, losartan/HCT 100/25 mg. -Will add metoprolol 12.5 mg twice daily he will continue to do ambulatory measurements and return in 6 to 8 weeks for follow-up.  Dyslipidemia  - Plan: Lipid panel -Last lipid panel in March 2022 with a total cholesterol of 138, triglycerides 88 and LDL 70. -He is on pravastatin 80 mg daily.  Type 2 diabetes mellitus with other specified complication, without long-term current use of insulin ([place])  - Plan: CBC with Differential/Platelet,  Comprehensive metabolic panel, Hemoglobin A1c -Check A1c today.  He is on semaglutide 0.5 mg weekly and metformin 500 mg twice daily.    Patient Instructions  -Nice seeing you today!!  -Lab work today; will notify you once results are available.  -Start metoprolol 12.5 mg twice daily (half tablet twice daily).  -Schedule follow up in 6 weeks.     Lelon Frohlich, MD [facility]

## 2021-04-20 NOTE — Patient Instructions (Signed)
-  Nice seeing you today!!  -Lab work today; will notify you once results are available.  -Start metoprolol 12.5 mg twice daily (half tablet twice daily).  -Schedule follow up in 6 weeks.

## 2021-04-21 LAB — COMPREHENSIVE METABOLIC PANEL
AG Ratio: 1.7 (calc) (ref 1.0–2.5)
ALT: 45 U/L (ref 9–46)
AST: 35 U/L (ref 10–40)
Albumin: 5 g/dL (ref 3.6–5.1)
Alkaline phosphatase (APISO): 67 U/L (ref 36–130)
BUN: 11 mg/dL (ref 7–25)
CO2: 29 mmol/L (ref 20–32)
Calcium: 10.5 mg/dL — ABNORMAL HIGH (ref 8.6–10.3)
Chloride: 97 mmol/L — ABNORMAL LOW (ref 98–110)
Creat: 1.06 mg/dL (ref 0.60–1.29)
Globulin: 3 g/dL (calc) (ref 1.9–3.7)
Glucose, Bld: 137 mg/dL — ABNORMAL HIGH (ref 65–99)
Potassium: 3.6 mmol/L (ref 3.5–5.3)
Sodium: 139 mmol/L (ref 135–146)
Total Bilirubin: 0.4 mg/dL (ref 0.2–1.2)
Total Protein: 8 g/dL (ref 6.1–8.1)

## 2021-04-21 LAB — HEMOGLOBIN A1C
Hgb A1c MFr Bld: 7.7 % of total Hgb — ABNORMAL HIGH (ref <5.7)
Mean Plasma Glucose: 174 mg/dL
eAG (mmol/L): 9.7 mmol/L

## 2021-04-21 LAB — CBC WITH DIFFERENTIAL/PLATELET
Absolute Monocytes: 369 cells/uL (ref 200–950)
Basophils Absolute: 21 cells/uL (ref 0–200)
Basophils Relative: 0.4 %
Eosinophils Absolute: 88 cells/uL (ref 15–500)
Eosinophils Relative: 1.7 %
HCT: 43.3 % (ref 38.5–50.0)
Hemoglobin: 13 g/dL — ABNORMAL LOW (ref 13.2–17.1)
Lymphs Abs: 2350 cells/uL (ref 850–3900)
MCH: 20.1 pg — ABNORMAL LOW (ref 27.0–33.0)
MCHC: 30 g/dL — ABNORMAL LOW (ref 32.0–36.0)
MCV: 67 fL — ABNORMAL LOW (ref 80.0–100.0)
MPV: 9.2 fL (ref 7.5–12.5)
Monocytes Relative: 7.1 %
Neutro Abs: 2371 cells/uL (ref 1500–7800)
Neutrophils Relative %: 45.6 %
Platelets: 430 10*3/uL — ABNORMAL HIGH (ref 140–400)
RBC: 6.46 10*6/uL — ABNORMAL HIGH (ref 4.20–5.80)
RDW: 17.1 % — ABNORMAL HIGH (ref 11.0–15.0)
Total Lymphocyte: 45.2 %
WBC: 5.2 10*3/uL (ref 3.8–10.8)

## 2021-04-21 LAB — LIPID PANEL
Cholesterol: 162 mg/dL (ref <200)
HDL: 45 mg/dL (ref >=40)
LDL Cholesterol (Calc): 84 mg/dL (calc)
Non-HDL Cholesterol (Calc): 117 mg/dL (calc) (ref <130)
Total CHOL/HDL Ratio: 3.6 (calc) (ref <5.0)
Triglycerides: 235 mg/dL — ABNORMAL HIGH (ref <150)

## 2021-04-21 LAB — VITAMIN B12: Vitamin B-12: >2000 pg/mL — ABNORMAL HIGH (ref 200–1100)

## 2021-04-21 LAB — TSH: TSH: 2.67 mIU/L (ref 0.40–4.50)

## 2021-04-21 LAB — VITAMIN D 25 HYDROXY (VIT D DEFICIENCY, FRACTURES): Vit D, 25-Hydroxy: 44 ng/mL (ref 30–100)

## 2021-04-21 LAB — PSA: PSA: 0.34 ng/mL (ref <=4.00)

## 2021-04-25 ENCOUNTER — Other Ambulatory Visit: Payer: Self-pay | Admitting: Internal Medicine

## 2021-04-25 ENCOUNTER — Other Ambulatory Visit (HOSPITAL_BASED_OUTPATIENT_CLINIC_OR_DEPARTMENT_OTHER): Payer: Self-pay

## 2021-04-25 DIAGNOSIS — E785 Hyperlipidemia, unspecified: Secondary | ICD-10-CM

## 2021-04-25 DIAGNOSIS — D509 Iron deficiency anemia, unspecified: Secondary | ICD-10-CM

## 2021-04-25 DIAGNOSIS — Z1211 Encounter for screening for malignant neoplasm of colon: Secondary | ICD-10-CM

## 2021-04-25 DIAGNOSIS — E119 Type 2 diabetes mellitus without complications: Secondary | ICD-10-CM

## 2021-04-25 DIAGNOSIS — D649 Anemia, unspecified: Secondary | ICD-10-CM

## 2021-04-25 MED ORDER — ATORVASTATIN CALCIUM 80 MG PO TABS
80.0000 mg | ORAL_TABLET | Freq: Every day | ORAL | 1 refills | Status: DC
  Filled 2021-04-25: qty 90, 90d supply, fill #0

## 2021-04-25 MED ORDER — SEMAGLUTIDE (1 MG/DOSE) 4 MG/3ML ~~LOC~~ SOPN
1.0000 mg | PEN_INJECTOR | SUBCUTANEOUS | 0 refills | Status: DC
  Filled 2021-04-25: qty 9, 84d supply, fill #0

## 2021-04-26 ENCOUNTER — Other Ambulatory Visit (HOSPITAL_BASED_OUTPATIENT_CLINIC_OR_DEPARTMENT_OTHER): Payer: Self-pay

## 2021-04-27 ENCOUNTER — Other Ambulatory Visit (INDEPENDENT_AMBULATORY_CARE_PROVIDER_SITE_OTHER): Payer: No Typology Code available for payment source

## 2021-04-27 DIAGNOSIS — E785 Hyperlipidemia, unspecified: Secondary | ICD-10-CM

## 2021-04-27 DIAGNOSIS — D509 Iron deficiency anemia, unspecified: Secondary | ICD-10-CM

## 2021-04-27 LAB — LIPID PANEL
Cholesterol: 146 mg/dL (ref 0–200)
HDL: 36.5 mg/dL — ABNORMAL LOW (ref >39.00)
LDL Cholesterol: 85 mg/dL (ref 0–99)
NonHDL: 109.97
Total CHOL/HDL Ratio: 4
Triglycerides: 127 mg/dL (ref 0.0–149.0)
VLDL: 25.4 mg/dL (ref 0.0–40.0)

## 2021-04-27 LAB — FERRITIN: Ferritin: 12.4 ng/mL — ABNORMAL LOW (ref 22.0–322.0)

## 2021-04-27 LAB — IBC PANEL
Iron: 54 ug/dL (ref 42–165)
Saturation Ratios: 9.8 % — ABNORMAL LOW (ref 20.0–50.0)
TIBC: 551.6 ug/dL — ABNORMAL HIGH (ref 250.0–450.0)
Transferrin: 394 mg/dL — ABNORMAL HIGH (ref 212.0–360.0)

## 2021-04-30 ENCOUNTER — Encounter: Payer: Self-pay | Admitting: Internal Medicine

## 2021-05-01 ENCOUNTER — Other Ambulatory Visit (HOSPITAL_BASED_OUTPATIENT_CLINIC_OR_DEPARTMENT_OTHER): Payer: Self-pay

## 2021-05-07 ENCOUNTER — Other Ambulatory Visit (HOSPITAL_BASED_OUTPATIENT_CLINIC_OR_DEPARTMENT_OTHER): Payer: Self-pay

## 2021-05-16 ENCOUNTER — Encounter: Payer: Self-pay | Admitting: Gastroenterology

## 2021-05-16 ENCOUNTER — Ambulatory Visit (INDEPENDENT_AMBULATORY_CARE_PROVIDER_SITE_OTHER): Payer: No Typology Code available for payment source | Admitting: Gastroenterology

## 2021-05-16 VITALS — BP 148/76 | HR 70 | Ht 72.0 in | Wt 237.0 lb

## 2021-05-16 DIAGNOSIS — K219 Gastro-esophageal reflux disease without esophagitis: Secondary | ICD-10-CM | POA: Diagnosis not present

## 2021-05-16 DIAGNOSIS — D509 Iron deficiency anemia, unspecified: Secondary | ICD-10-CM | POA: Diagnosis not present

## 2021-05-16 DIAGNOSIS — Z800 Family history of malignant neoplasm of digestive organs: Secondary | ICD-10-CM | POA: Diagnosis not present

## 2021-05-16 NOTE — Progress Notes (Signed)
Assessment and plan reviewed 

## 2021-05-16 NOTE — Patient Instructions (Signed)
You have been scheduled for an endoscopy and colonoscopy. Please follow the written instructions given to you at your visit today. Please pick up your prep supplies at the pharmacy within the next 1-3 days. If you use inhalers (even only as needed), please bring them with you on the day of your procedure.   If you are age 45 or older, your body mass index should be between 23-30. Your Body mass index is 32.14 kg/m. If this is out of the aforementioned range listed, please consider follow up with your Primary Care Provider.  If you are age 44 or younger, your body mass index should be between 19-25. Your Body mass index is 32.14 kg/m. If this is out of the aformentioned range listed, please consider follow up with your Primary Care Provider.   ________________________________________________________  The [facility] providers would like to encourage you to use [facility] to communicate with providers for non-urgent requests or questions.  Due to long hold times on the telephone, sending your provider a message by [facility] may be a faster and more efficient way to get a response.  Please allow 48 business hours for a response.  Please remember that this is for non-urgent requests.  _______________________________________________________   Due to recent changes in healthcare laws, you may see the results of your imaging and laboratory studies on MyChart before your provider has had a chance to review them.  We understand that in some cases there may be results that are confusing or concerning to you. Not all laboratory results come back in the same time frame and the provider may be waiting for multiple results in order to interpret others.  Please give [place] 48 hours in order for your provider to thoroughly review all the results before contacting the office for clarification of your results.    I appreciate the  opportunity to care for you  Thank You   Shanda Bumps Zehr,PA-C

## 2021-05-16 NOTE — Progress Notes (Signed)
05/16/2021 Reginald Kirby [id number] May 11, 1976   HISTORY OF PRESENT ILLNESS: This is a 45 year old male who is a patient of Dr. Lamar Sprinkles, known him for colonoscopy in December 2018 at which time he was found to have only diverticulosis and hemorrhoids.  Patient has a family history of colon cancer in his father.  His father was diagnosed at age 79 and died at age 57.  Patient states that he had noticed that he had been quite fatigued recently.  Had blood work performed by his PCP and hemoglobin was found to be 13 g, which is actually where it has been over the past year, but in previous years it was in the 14 g range.  It was also noted that his MCV had continued to decline and was 67.  Iron studies were performed and showed a low ferritin at 12.4, serum iron of 54, iron saturation 9.8, TIBC elevated at 551.  He was placed on ferrous sulfate 325 mg daily about 4 weeks ago.  He says that he has felt a little bit better since taking that.  He denies seeing any blood in his stools or any black stools.  No hemoccults have been performed.  Of course now the iron has made his stools darker in color.  He says that he had started taking some stool softeners and had some looser stools and some abdominal discomfort that he thinks may have been related to that, but he discontinued the stool softeners and those symptoms seemed to go away.  He has longstanding acid reflux and has been on omeprazole 40 mg daily for about 6 years.  He says that since summer he has noticed an increase in his reflux symptoms.  He said despite the omeprazole he was having to take about 4 Tums with every meal.  He has made some dietary changes recently and that has helped a lot with the reflux symptoms.  No nausea or vomiting.  Referred here on this occasion by his PCP, Dr. Philip Aspen, for evaluation regarding iron deficiency anemia.   Past Medical History:  Diagnosis Date   Anxiety    Arthritis    DDD  BACK   Depression     Diabetes mellitus    GERD (gastroesophageal reflux disease)    Headache    HX MIGRAINES    Hyperlipidemia    Hypertension    Sinusitis    Past Surgical History:  Procedure Laterality Date   CARDIAC CATHETERIZATION     2006    OKAY    epidural injections   2002   LUMBAR LAMINECTOMY/DECOMPRESSION MICRODISCECTOMY N/A 10/13/2015   Procedure: L3-4 L4-5 L5-S1 Laminectomy;  Surgeon: Barnett Abu, MD;  Location: [facility];  Service: Neurosurgery;  Laterality: N/A;  L3-4 L4-5 L5-S1 Laminectomy    reports that he has never smoked. He has never used smokeless tobacco. He reports current alcohol use. He reports that he does not use drugs. family history includes Colon cancer in his father; Diabetes in his brother; Heart disease in his mother. Allergies  Allergen Reactions   Demerol Itching      Outpatient Encounter Medications as of 05/16/2021  Medication Sig   amLODipine (NORVASC) 10 MG tablet Take 1 tablet (10 mg total) by mouth daily.   atorvastatin (LIPITOR) 80 MG tablet Take 1 tablet (80 mg total) by mouth daily.   buPROPion (WELLBUTRIN XL) 300 MG 24 hr tablet TAKE 1 TABLET (300 MG TOTAL) BY MOUTH DAILY.   cyclobenzaprine (  FLEXERIL) 10 MG tablet Take 1 tablet (10 mg total) by mouth 3 (three) times daily as needed for muscle spasms.   ferrous sulfate 325 (65 FE) MG tablet Take 325 mg by mouth daily with breakfast.   glipiZIDE (GLUCOTROL XL) 5 MG 24 hr tablet Take 1 tablet (5 mg total) by mouth daily with breakfast.   glucose blood (FREESTYLE LITE) test strip 1 each by Other route daily. Dx E11.9   losartan-hydrochlorothiazide (HYZAAR) 100-25 MG tablet TAKE 1 TABLET BY MOUTH ONCE DAILY.   metFORMIN (GLUCOPHAGE) 500 MG tablet Take 1 tablet (500 mg total) by mouth 2 (two) times daily with a meal.   metoprolol tartrate (LOPRESSOR) 25 MG tablet Take 0.5 tablets (12.5 mg total) by mouth 2 (two) times daily.   omeprazole (PRILOSEC) 40 MG capsule TAKE 1 CAPSULE (40 MG TOTAL) BY MOUTH DAILY.    Semaglutide, 1 MG/DOSE, 4 MG/3ML SOPN Inject 1 mg as directed once a week.   [DISCONTINUED] aspirin EC 81 MG tablet Take 1 tablet (81 mg total) by mouth daily.   No facility-administered encounter medications on file as of 05/16/2021.    REVIEW OF SYSTEMS  : All other systems reviewed and negative except where noted in the History of Present Illness.   PHYSICAL EXAM: BP (!) 148/76   Pulse 70   Ht 6' (1.829 m)   Wt 237 lb (107.5 kg)   BMI 32.14 kg/m  General: Well developed AA male in no acute distress Head: Normocephalic and atraumatic Eyes:  Sclerae anicteric, conjunctiva pink. Ears: Normal auditory acuity Lungs: Clear throughout to auscultation; no W/R/R. Heart: Regular rate and rhythm; no M/R/G. Abdomen: Soft, non-distended.  BS present.  Non-tender. Rectal:  Will be done at the time of colonoscopy. Musculoskeletal: Symmetrical with no gross deformities  Skin: No lesions on visible extremities Extremities: No edema  Neurological: Alert oriented x 4, grossly non-focal Psychological:  Alert and cooperative. Normal mood and affect  ASSESSMENT AND PLAN: *Iron deficiency anemia: Hemoglobin is 13 grams, but iron studies low.  No overt sign of GI bleeding.  Stools have not been hemocculted.  Had colonoscopy 4 years ago, but has never had an EGD. *Family history of colon cancer in his father diagnosed at age 45. *GERD: Longstanding, has been on omeprazole 40 mg daily for about 6 years or so.  Overall well controlled especially more recently with some dietary changes.  **We will plan for both EGD and colonoscopy with Dr. Marina Goodell.  If those are negative may want to consider Hemocculting stool to determine if any further GI work-up is necessary or if he should rather have a hematologic evaluation, etc.  The risks, benefits, and alternatives to EGD and colonoscopy were discussed with the patient and he consents to proceed.    CC:  Philip Aspen, [place]*

## 2021-05-20 ENCOUNTER — Other Ambulatory Visit: Payer: Self-pay | Admitting: Internal Medicine

## 2021-05-20 DIAGNOSIS — M545 Low back pain, unspecified: Secondary | ICD-10-CM

## 2021-05-22 ENCOUNTER — Other Ambulatory Visit: Payer: Self-pay | Admitting: Internal Medicine

## 2021-05-22 ENCOUNTER — Other Ambulatory Visit (HOSPITAL_BASED_OUTPATIENT_CLINIC_OR_DEPARTMENT_OTHER): Payer: Self-pay

## 2021-05-22 DIAGNOSIS — M545 Low back pain, unspecified: Secondary | ICD-10-CM

## 2021-05-22 MED ORDER — CYCLOBENZAPRINE HCL 10 MG PO TABS
10.0000 mg | ORAL_TABLET | Freq: Three times a day (TID) | ORAL | 0 refills | Status: DC | PRN
  Filled 2021-05-22: qty 15, 5d supply, fill #0

## 2021-05-24 ENCOUNTER — Other Ambulatory Visit (HOSPITAL_BASED_OUTPATIENT_CLINIC_OR_DEPARTMENT_OTHER): Payer: Self-pay

## 2021-05-29 ENCOUNTER — Other Ambulatory Visit (HOSPITAL_BASED_OUTPATIENT_CLINIC_OR_DEPARTMENT_OTHER): Payer: Self-pay

## 2021-05-31 ENCOUNTER — Encounter: Payer: Self-pay | Admitting: Internal Medicine

## 2021-05-31 ENCOUNTER — Other Ambulatory Visit (HOSPITAL_BASED_OUTPATIENT_CLINIC_OR_DEPARTMENT_OTHER): Payer: Self-pay

## 2021-05-31 DIAGNOSIS — I10 Essential (primary) hypertension: Secondary | ICD-10-CM

## 2021-06-01 ENCOUNTER — Other Ambulatory Visit (HOSPITAL_BASED_OUTPATIENT_CLINIC_OR_DEPARTMENT_OTHER): Payer: Self-pay

## 2021-06-01 MED ORDER — METOPROLOL TARTRATE 25 MG PO TABS
12.5000 mg | ORAL_TABLET | Freq: Two times a day (BID) | ORAL | 1 refills | Status: DC
  Filled 2021-06-01 (×2): qty 90, 90d supply, fill #0
  Filled 2021-07-09: qty 90, 90d supply, fill #1

## 2021-06-22 ENCOUNTER — Other Ambulatory Visit (HOSPITAL_BASED_OUTPATIENT_CLINIC_OR_DEPARTMENT_OTHER): Payer: Self-pay

## 2021-06-22 ENCOUNTER — Other Ambulatory Visit: Payer: Self-pay | Admitting: Internal Medicine

## 2021-06-22 DIAGNOSIS — E119 Type 2 diabetes mellitus without complications: Secondary | ICD-10-CM

## 2021-06-22 DIAGNOSIS — I10 Essential (primary) hypertension: Secondary | ICD-10-CM

## 2021-06-25 ENCOUNTER — Other Ambulatory Visit (HOSPITAL_BASED_OUTPATIENT_CLINIC_OR_DEPARTMENT_OTHER): Payer: Self-pay

## 2021-06-25 MED ORDER — OZEMPIC (1 MG/DOSE) 4 MG/3ML ~~LOC~~ SOPN
1.0000 mg | PEN_INJECTOR | SUBCUTANEOUS | 0 refills | Status: DC
  Filled 2021-06-25 – 2021-07-02 (×3): qty 9, 84d supply, fill #0

## 2021-06-25 MED ORDER — AMLODIPINE BESYLATE 10 MG PO TABS
10.0000 mg | ORAL_TABLET | Freq: Every day | ORAL | 0 refills | Status: DC
  Filled 2021-06-25: qty 90, 90d supply, fill #0

## 2021-06-26 ENCOUNTER — Encounter: Payer: Self-pay | Admitting: Internal Medicine

## 2021-06-27 ENCOUNTER — Encounter: Payer: Self-pay | Admitting: Internal Medicine

## 2021-06-29 ENCOUNTER — Other Ambulatory Visit (HOSPITAL_BASED_OUTPATIENT_CLINIC_OR_DEPARTMENT_OTHER): Payer: Self-pay

## 2021-07-02 ENCOUNTER — Other Ambulatory Visit: Payer: Self-pay | Admitting: Gastroenterology

## 2021-07-02 ENCOUNTER — Ambulatory Visit (AMBULATORY_SURGERY_CENTER): Payer: 59 | Admitting: Internal Medicine

## 2021-07-02 ENCOUNTER — Encounter: Payer: Self-pay | Admitting: Internal Medicine

## 2021-07-02 ENCOUNTER — Other Ambulatory Visit (INDEPENDENT_AMBULATORY_CARE_PROVIDER_SITE_OTHER): Payer: 59

## 2021-07-02 ENCOUNTER — Other Ambulatory Visit (HOSPITAL_BASED_OUTPATIENT_CLINIC_OR_DEPARTMENT_OTHER): Payer: Self-pay

## 2021-07-02 VITALS — BP 158/85 | HR 75 | Temp 98.6°F | Resp 14 | Ht 72.0 in | Wt 237.0 lb

## 2021-07-02 DIAGNOSIS — Z800 Family history of malignant neoplasm of digestive organs: Secondary | ICD-10-CM

## 2021-07-02 DIAGNOSIS — E119 Type 2 diabetes mellitus without complications: Secondary | ICD-10-CM | POA: Diagnosis not present

## 2021-07-02 DIAGNOSIS — K5730 Diverticulosis of large intestine without perforation or abscess without bleeding: Secondary | ICD-10-CM

## 2021-07-02 DIAGNOSIS — K648 Other hemorrhoids: Secondary | ICD-10-CM

## 2021-07-02 DIAGNOSIS — F329 Major depressive disorder, single episode, unspecified: Secondary | ICD-10-CM | POA: Diagnosis not present

## 2021-07-02 DIAGNOSIS — D509 Iron deficiency anemia, unspecified: Secondary | ICD-10-CM

## 2021-07-02 DIAGNOSIS — K219 Gastro-esophageal reflux disease without esophagitis: Secondary | ICD-10-CM | POA: Diagnosis not present

## 2021-07-02 DIAGNOSIS — I10 Essential (primary) hypertension: Secondary | ICD-10-CM | POA: Diagnosis not present

## 2021-07-02 DIAGNOSIS — E785 Hyperlipidemia, unspecified: Secondary | ICD-10-CM | POA: Diagnosis not present

## 2021-07-02 DIAGNOSIS — F419 Anxiety disorder, unspecified: Secondary | ICD-10-CM | POA: Diagnosis not present

## 2021-07-02 LAB — CBC WITH DIFFERENTIAL/PLATELET
Basophils Absolute: 0 10*3/uL (ref 0.0–0.1)
Basophils Relative: 0.5 % (ref 0.0–3.0)
Eosinophils Absolute: 0 10*3/uL (ref 0.0–0.7)
Eosinophils Relative: 0.7 % (ref 0.0–5.0)
HCT: 41 % (ref 39.0–52.0)
Hemoglobin: 12.6 g/dL — ABNORMAL LOW (ref 13.0–17.0)
Lymphocytes Relative: 24.2 % (ref 12.0–46.0)
Lymphs Abs: 1.2 10*3/uL (ref 0.7–4.0)
MCHC: 30.6 g/dL (ref 30.0–36.0)
MCV: 68.9 fl — ABNORMAL LOW (ref 78.0–100.0)
Monocytes Absolute: 0.3 10*3/uL (ref 0.1–1.0)
Monocytes Relative: 6.5 % (ref 3.0–12.0)
Neutro Abs: 3.3 10*3/uL (ref 1.4–7.7)
Neutrophils Relative %: 68.1 % (ref 43.0–77.0)
Platelets: 291 10*3/uL (ref 150.0–400.0)
RBC: 5.95 Mil/uL — ABNORMAL HIGH (ref 4.22–5.81)
RDW: 17.5 % — ABNORMAL HIGH (ref 11.5–15.5)
WBC: 4.8 10*3/uL (ref 4.0–10.5)

## 2021-07-02 LAB — FERRITIN: Ferritin: 13 ng/mL — ABNORMAL LOW (ref 22.0–322.0)

## 2021-07-02 MED ORDER — SODIUM CHLORIDE 0.9 % IV SOLN: 500.0000 mL | Freq: Once | INTRAVENOUS | Status: DC

## 2021-07-02 NOTE — Patient Instructions (Signed)
Please read handouts provided. Continue present medications. Await pathology results. Repeat colonoscopy in 5 years.    YOU HAD AN ENDOSCOPIC PROCEDURE TODAY AT THE [facility]:   Refer to the procedure report that was given to you for any specific questions about what was found during the examination.  If the procedure report does not answer your questions, please call your gastroenterologist to clarify.  If you requested that your care partner not be given the details of your procedure findings, then the procedure report has been included in a sealed envelope for you to review at your convenience later.  YOU SHOULD EXPECT: Some feelings of bloating in the abdomen. Passage of more gas than usual.  Walking can help get rid of the air that was put into your GI tract during the procedure and reduce the bloating. If you had a lower endoscopy (such as a colonoscopy or flexible sigmoidoscopy) you may notice spotting of blood in your stool or on the toilet paper. If you underwent a bowel prep for your procedure, you may not have a normal bowel movement for a few days.  Please Note:  You might notice some irritation and congestion in your nose or some drainage.  This is from the oxygen used during your procedure.  There is no need for concern and it should clear up in a day or so.  SYMPTOMS TO REPORT IMMEDIATELY:  Following lower endoscopy (colonoscopy or flexible sigmoidoscopy):  Excessive amounts of blood in the stool  Significant tenderness or worsening of abdominal pains  Swelling of the abdomen that is new, acute  Fever of 100F or higher  Following upper endoscopy (EGD)  Vomiting of blood or coffee ground material  New chest pain or pain under the shoulder blades  Painful or persistently difficult swallowing  New shortness of breath  Fever of 100F or higher  Black, tarry-looking stools  For urgent or emergent issues, a gastroenterologist can be reached at any hour by calling  [phone]. Do not use MyChart messaging for urgent concerns.    DIET:  We do recommend a small meal at first, but then you may proceed to your regular diet.  Drink plenty of fluids but you should avoid alcoholic beverages for 24 hours.  ACTIVITY:  You should plan to take it easy for the rest of today and you should NOT DRIVE or use heavy machinery until tomorrow (because of the sedation medicines used during the test).    FOLLOW UP: Our staff will call the number listed on your records 48-72 hours following your procedure to check on you and address any questions or concerns that you may have regarding the information given to you following your procedure. If we do not reach you, we will leave a message.  We will attempt to reach you two times.  During this call, we will ask if you have developed any symptoms of COVID 19. If you develop any symptoms (ie: fever, flu-like symptoms, shortness of breath, cough etc.) before then, please call [phone].  If you test positive for Covid 19 in the 2 weeks post procedure, please call and report this information to [place].    If any biopsies were taken you will be contacted by phone or by letter within the next 1-3 weeks.  Please call us at [phone] if you have not heard about the biopsies in 3 weeks.    SIGNATURES/CONFIDENTIALITY: You and/or your care partner have signed paperwork which will be entered into your  electronic medical record.  These signatures attest to the fact that that the information above on your After Visit Summary has been reviewed and is understood.  Full responsibility of the confidentiality of this discharge information lies with you and/or your care-partner.

## 2021-07-02 NOTE — Progress Notes (Signed)
Called to room to assist during endoscopic procedure.  Patient ID and intended procedure confirmed with present staff. Received instructions for my participation in the procedure from the performing physician.  

## 2021-07-02 NOTE — Progress Notes (Signed)
HISTORY OF PRESENT ILLNESS:  Reginald Syme. is a 46 y.o. male was evaluated in the office by the GI physician assistant May 16, 2021 regarding iron deficiency anemia.  See that dictation.  He is now for colonoscopy and upper endoscopy to further evaluate this problem.  No interval clinical change  REVIEW OF SYSTEMS:  All non-GI ROS negative. Past Medical History:  Diagnosis Date   Anxiety    Arthritis    DDD  BACK   Depression    Diabetes mellitus    GERD (gastroesophageal reflux disease)    Headache    HX MIGRAINES    Hyperlipidemia    Hypertension    Sinusitis     Past Surgical History:  Procedure Laterality Date   CARDIAC CATHETERIZATION     2006    OKAY    epidural injections   2002   LUMBAR LAMINECTOMY/DECOMPRESSION MICRODISCECTOMY N/A 10/13/2015   Procedure: L3-4 L4-5 L5-S1 Laminectomy;  Surgeon: Barnett Abu, MD;  Location: [facility];  Service: Neurosurgery;  Laterality: N/A;  L3-4 L4-5 L5-S1 Laminectomy    Social History Reginald Bir.  reports that he has never smoked. He has never used smokeless tobacco. He reports current alcohol use. He reports that he does not use drugs.  family history includes Colon cancer in his father; Diabetes in his brother; Heart disease in his mother.  Allergies  Allergen Reactions   Demerol Itching       PHYSICAL EXAMINATION:  Vital signs: BP (!) 136/101    Pulse 86    Temp 98.6 F (37 C)    Ht 6' (1.829 m)    Wt 237 lb (107.5 kg)    SpO2 100%    BMI 32.14 kg/m  General: Well-developed, well-nourished, no acute distress HEENT: Sclerae are anicteric, conjunctiva pink. Oral mucosa intact Lungs: Clear Heart: Regular Abdomen: soft, nontender, nondistended, no obvious ascites, no peritoneal signs, normal bowel sounds. No organomegaly. Extremities: No edema Psychiatric: alert and oriented x3. Cooperative     ASSESSMENT:  1.  Iron deficiency anemia 2.  Family history of colon cancer   PLAN:   1.   Colonoscopy and upper endoscopy.

## 2021-07-02 NOTE — Op Note (Signed)
[facility] Patient Name: Reginald Kirby Procedure Date: 07/02/2021 2:49 PM MRN: [id number] Endoscopist: Docia Chuck. Henrene Pastor , MD Age: 46 Referring MD:  Date of Birth: 1976/02/12 Gender: Male Account #: [id number] Procedure:                Colonoscopy Indications:              Iron deficiency anemia. Father with a history of                            colon cancer listed age 40. Previous colonoscopy                            2018 was negative for neoplasia. Medicines:                Monitored Anesthesia Care Procedure:                Pre-Anesthesia Assessment:                           - Prior to the procedure, a History and Physical                            was performed, and patient medications and                            allergies were reviewed. The patient's tolerance of                            previous anesthesia was also reviewed. The risks                            and benefits of the procedure and the sedation                            options and risks were discussed with the patient.                            All questions were answered, and informed consent                            was obtained. Prior Anticoagulants: The patient has                            taken no previous anticoagulant or antiplatelet                            agents. ASA Grade Assessment: II - A patient with                            mild systemic disease. After reviewing the risks                            and benefits, the patient was deemed in  satisfactory condition to undergo the procedure.                           After obtaining informed consent, the colonoscope                            was passed under direct vision. Throughout the                            procedure, the patient's blood pressure, pulse, and                            oxygen saturations were monitored continuously. The                            Endoscope was introduced through  the anus and                            advanced to the the cecum, identified by                            appendiceal orifice and ileocecal valve. The                            ileocecal valve, appendiceal orifice, and rectum                            were photographed. The quality of the bowel                            preparation was excellent. The colonoscopy was                            performed without difficulty. The patient tolerated                            the procedure well. The bowel preparation used was                            SUPREP via split dose instruction. Scope In: 2:58:40 PM Scope Out: 3:14:37 PM Scope Withdrawal Time: 0 hours 12 minutes 48 seconds  Total Procedure Duration: 0 hours 15 minutes 57 seconds  Findings:                 Multiple diverticula were found in the left colon                            and right colon.                           Internal hemorrhoids were found during retroflexion.                           The exam was otherwise without abnormality on  direct and retroflexion views. Complications:            No immediate complications. Estimated blood loss:                            None. Estimated Blood Loss:     Estimated blood loss: none. Impression:               - Diverticulosis in the left colon and in the right                            colon.                           - Internal hemorrhoids.                           - The examination was otherwise normal on direct                            and retroflexion views.                           - No specimens collected. Recommendation:           - Repeat colonoscopy in 5 years for surveillance                            (family history).                           - Patient has a contact number available for                            emergencies. The signs and symptoms of potential                            delayed complications were discussed with the                             patient. Return to normal activities tomorrow.                            Written discharge instructions were provided to the                            patient.                           - Resume previous diet.                           - Continue present medications.                           -EGD today. Please see report Docia Chuck. Henrene Pastor, MD 07/02/2021 3:19:34 PM This report has been signed electronically.

## 2021-07-02 NOTE — Op Note (Signed)
[facility] Patient Name: Reginald Kirby Procedure Date: 07/02/2021 2:40 PM MRN: [id number] Endoscopist: Docia Chuck. Henrene Pastor , MD Age: 46 Referring MD:  Date of Birth: 21-Mar-1976 Gender: Male Account #: [id number] Procedure:                Upper GI endoscopy Indications:              Iron deficiency anemia, Esophageal reflux Medicines:                Monitored Anesthesia Care Procedure:                Pre-Anesthesia Assessment:                           - Prior to the procedure, a History and Physical                            was performed, and patient medications and                            allergies were reviewed. The patient's tolerance of                            previous anesthesia was also reviewed. The risks                            and benefits of the procedure and the sedation                            options and risks were discussed with the patient.                            All questions were answered, and informed consent                            was obtained. Prior Anticoagulants: The patient has                            taken no previous anticoagulant or antiplatelet                            agents. ASA Grade Assessment: II - A patient with                            mild systemic disease. After reviewing the risks                            and benefits, the patient was deemed in                            satisfactory condition to undergo the procedure.                           After obtaining informed consent, the endoscope was  passed under direct vision. Throughout the                            procedure, the patient's blood pressure, pulse, and                            oxygen saturations were monitored continuously. The                            Endoscope was introduced through the mouth, and                            advanced to the second part of duodenum. The upper                            GI endoscopy was  accomplished without difficulty.                            The patient tolerated the procedure well. Scope In: Scope Out: Findings:                 The esophagus was normal.                           The stomach was normal.                           The examined duodenum was normal. Biopsies for                            histology were taken with a cold forceps for                            evaluation of celiac disease.                           The cardia and gastric fundus were normal on                            retroflexion. Complications:            No immediate complications. Estimated Blood Loss:     Estimated blood loss: none. Impression:               - Normal esophagus.                           - Normal stomach.                           - Normal examined duodenum. Biopsied. Recommendation:           - Patient has a contact number available for                            emergencies. The signs and symptoms of potential  delayed complications were discussed with the                            patient. Return to normal activities tomorrow.                            Written discharge instructions were provided to the                            patient.                           - Resume previous diet.                           - Continue present medications.                           - Await pathology results.                           - CBC and ferritin level today Joss Friedel N. Henrene Pastor, MD 07/02/2021 3:29:00 PM This report has been signed electronically.

## 2021-07-02 NOTE — Progress Notes (Signed)
Sedate, gd SR, tolerated procedure well, VSS, report to RN 

## 2021-07-03 ENCOUNTER — Other Ambulatory Visit: Payer: Self-pay

## 2021-07-03 DIAGNOSIS — D509 Iron deficiency anemia, unspecified: Secondary | ICD-10-CM

## 2021-07-04 ENCOUNTER — Encounter: Payer: Self-pay | Admitting: Internal Medicine

## 2021-07-04 ENCOUNTER — Telehealth: Payer: Self-pay | Admitting: *Deleted

## 2021-07-04 NOTE — Telephone Encounter (Signed)
°  Follow up Call-  Call back number 07/02/2021  Post procedure Call Back phone  # [phone]  Permission to leave phone message Yes  Some recent data might be hidden     Patient questions:  Do you have a fever, pain , or abdominal swelling? No. Pain Score  0 *  Have you tolerated food without any problems? Yes.    Have you been able to return to your normal activities? Yes.    Do you have any questions about your discharge instructions: Diet   No. Medications  No. Follow up visit  No.  Do you have questions or concerns about your Care? No.  Actions: * If pain score is 4 or above: No action needed, pain <4.

## 2021-07-05 ENCOUNTER — Encounter: Payer: Self-pay | Admitting: Internal Medicine

## 2021-07-05 ENCOUNTER — Telehealth: Payer: Self-pay

## 2021-07-05 NOTE — Telephone Encounter (Signed)
Those labs cannot be 4 weeks out.  They should be same-day or next.  I am copying Judeen Hammans on this.  Find out what is going on in the lab and sorted out.  Thanks

## 2021-07-05 NOTE — Telephone Encounter (Signed)
-----   Message from Hilarie Fredrickson, MD sent at 07/05/2021  2:56 PM EST ----- Regarding: Blood work and stool studies Hello nurse covering for Bonita Quin,  This patient had colonoscopy and upper endoscopy earlier this week.  That day, I requested blood work and stool studies as indicated in my note.  They are entered into the computer.  It does not look like the patient had the studies performed.  Please contact the patient so that he can perform the blood work and stool studies (Hemoccult).  Thanks  Dr. Marina Goodell

## 2021-07-05 NOTE — Telephone Encounter (Signed)
Called patient and he states he did the blood work on 07/02/21 and picked-up the Hemoccult cards. States he will bring back the Hemoccult cards on Monday 07/09/21. The lab orders entered on 07/03/21 are for 4 weeks out.

## 2021-07-05 NOTE — Telephone Encounter (Signed)
Dr. Marina Goodell, see the results notes from 07/03/21 below from labs drawn post procedure 07/02/21.  07/03/2021 10:04 AM EST     1.  Please let the patient know that his blood counts and iron levels remain low 2.  Please have him take iron sulfate 325 mg twice daily with meals 3.  Have him submit Hemoccult studies x 3 4.  Arrange hematology assessment "iron deficiency anemia with negative endoscopic work-up" 5.  Repeat CBC and ferritin in 4 weeks 6.  Consider capsule endoscopy.  Please provide him with information on that procedure 7.  Office follow-up with me in 6 weeks    You requested repeat CBC and ferritin in 4 weeks.  The labs listed in the chart dated 07/03/21 that say "future " are the orders for 4 weeks out when he returns.  When labs are requested for future dates, the orders are placed and will be listed as future in the system until the patient returns.  We contact the patient via phone or letter when it is time for the patient to have labs.   The patient knows to get the hemoccult studies returned asap. Please let me know if you have additional questions.

## 2021-07-06 NOTE — Telephone Encounter (Signed)
Thanks for clarifying.

## 2021-07-09 ENCOUNTER — Encounter: Payer: Self-pay | Admitting: Internal Medicine

## 2021-07-10 ENCOUNTER — Other Ambulatory Visit (HOSPITAL_BASED_OUTPATIENT_CLINIC_OR_DEPARTMENT_OTHER): Payer: Self-pay

## 2021-07-11 ENCOUNTER — Other Ambulatory Visit: Payer: Self-pay

## 2021-07-11 ENCOUNTER — Telehealth: Payer: Self-pay | Admitting: Internal Medicine

## 2021-07-11 ENCOUNTER — Other Ambulatory Visit (INDEPENDENT_AMBULATORY_CARE_PROVIDER_SITE_OTHER): Payer: 59

## 2021-07-11 DIAGNOSIS — E785 Hyperlipidemia, unspecified: Secondary | ICD-10-CM

## 2021-07-11 DIAGNOSIS — I10 Essential (primary) hypertension: Secondary | ICD-10-CM

## 2021-07-11 DIAGNOSIS — G4700 Insomnia, unspecified: Secondary | ICD-10-CM

## 2021-07-11 DIAGNOSIS — E119 Type 2 diabetes mellitus without complications: Secondary | ICD-10-CM

## 2021-07-11 DIAGNOSIS — D509 Iron deficiency anemia, unspecified: Secondary | ICD-10-CM | POA: Diagnosis not present

## 2021-07-11 LAB — HEMOCCULT SLIDES (X 3 CARDS)
Fecal Occult Blood: NEGATIVE
OCCULT 1: NEGATIVE
OCCULT 2: NEGATIVE
OCCULT 3: NEGATIVE
OCCULT 4: NEGATIVE
OCCULT 5: NEGATIVE

## 2021-07-11 MED ORDER — ATORVASTATIN CALCIUM 80 MG PO TABS: 80.0000 mg | ORAL_TABLET | Freq: Every day | ORAL | 1 refills | Status: DC

## 2021-07-11 MED ORDER — AMLODIPINE BESYLATE 10 MG PO TABS: 10.0000 mg | ORAL_TABLET | Freq: Every day | ORAL | 1 refills | Status: DC

## 2021-07-11 MED ORDER — METFORMIN HCL 500 MG PO TABS: 500.0000 mg | ORAL_TABLET | Freq: Two times a day (BID) | ORAL | 1 refills | Status: DC

## 2021-07-11 MED ORDER — LOSARTAN POTASSIUM-HCTZ 100-25 MG PO TABS: 1.0000 | ORAL_TABLET | Freq: Every day | ORAL | 1 refills | Status: DC

## 2021-07-11 MED ORDER — ATORVASTATIN CALCIUM 80 MG PO TABS: 80.0000 mg | ORAL_TABLET | Freq: Every day | ORAL | 0 refills | Status: DC

## 2021-07-11 MED ORDER — METFORMIN HCL 500 MG PO TABS: 500.0000 mg | ORAL_TABLET | Freq: Two times a day (BID) | ORAL | 0 refills | Status: DC

## 2021-07-11 MED ORDER — BUPROPION HCL ER (XL) 300 MG PO TB24: ORAL_TABLET | Freq: Every day | ORAL | 1 refills | Status: DC

## 2021-07-11 MED ORDER — BUPROPION HCL ER (XL) 300 MG PO TB24: ORAL_TABLET | Freq: Every day | ORAL | 0 refills | Status: DC

## 2021-07-11 MED ORDER — AMLODIPINE BESYLATE 10 MG PO TABS: 10.0000 mg | ORAL_TABLET | Freq: Every day | ORAL | 0 refills | Status: DC

## 2021-07-11 NOTE — Telephone Encounter (Signed)
Reginald Kirby from [facility] called to get New prescriptions for   atorvastatin (LIPITOR) 80 MG tablet  losartan-hydrochlorothiazide (HYZAAR) 100-25 MG tablet  buPROPion (WELLBUTRIN XL) 300 MG 24 hr tablet  amLODipine (NORVASC) 10 MG tablet  metFORMIN (GLUCOPHAGE) 500 MG tablet  Please send to    [place], [place] Phone:  [phone]  Fax:  [phone]        Please advise

## 2021-07-11 NOTE — Telephone Encounter (Signed)
Rx's sent in. °

## 2021-07-13 ENCOUNTER — Ambulatory Visit (INDEPENDENT_AMBULATORY_CARE_PROVIDER_SITE_OTHER): Payer: 59 | Admitting: Internal Medicine

## 2021-07-13 ENCOUNTER — Encounter: Payer: Self-pay | Admitting: Internal Medicine

## 2021-07-13 DIAGNOSIS — D509 Iron deficiency anemia, unspecified: Secondary | ICD-10-CM | POA: Diagnosis not present

## 2021-07-13 NOTE — Patient Instructions (Signed)
Contact our office immediately at [phone] if you suffer from any abdominal pain, nausea, or vomiting during capsule endoscopy. °a) Do not eat or drink for at least 2 hours. °After 2 hours you may have any of the following to drink: °Water   White grape juice °7-Up   Chicken Bouillon °Sprite   Ginger Ale °c) After 4 hours you may have a light snack to include any of the following: °A cup of soup  ½ sandwich °Bowl of cereal  Rice °Toast   Eggs °2-3 small cookies (i.e. vanilla wafers or graham crackers) °d) After 8 hours you may return to your regular diet. °During your procedure do not go near anyone else that is having capsule endoscopy. °Do not be in close contact with an MRI machine or a radio or television tower. °Do not wear a heavy coat or sweater because your recorder may over heat and stop recording.   °Do not disconnect the equipment or remove the belt at any time.  Since the Data Recorder is actually a small computer, it should be treated with utmost care and protection.  Avoid sudden movement and banging of the Data Recorder.  Do not do any heavy lifting or strenuous physical activity during the test especially if it involves sweating and do not bend over or stoop during capsule endoscopy. °During capsule endoscopy, you will need to verify every 15 minutes that the small light on top of the Data Recorder is blinking twice per second.  If for some reason it stops blinking at this site, record the time and contact our office at [phone]. ° ° ° °

## 2021-07-13 NOTE — Progress Notes (Signed)
Capsule ID: [id number] Exp: 07/27/22 LOT: [id number]  Patient arrived for Capsule Endoscopy. Reported the prep went well. This RN explained dietary restrictions for the next few hours and pt given instructions to take home. Patient verbalized understanding. Opened capsule, ensured capsule was flashing prior to the patient swallowing the capsule. Patient swallowed capsule. Patient told to call the office with any questions and if the capsule is not passed after 72 hours. No further questions by the conclusion of the visit.

## 2021-07-16 ENCOUNTER — Other Ambulatory Visit: Payer: Self-pay

## 2021-07-16 DIAGNOSIS — E119 Type 2 diabetes mellitus without complications: Secondary | ICD-10-CM

## 2021-07-16 DIAGNOSIS — K219 Gastro-esophageal reflux disease without esophagitis: Secondary | ICD-10-CM

## 2021-07-16 DIAGNOSIS — I10 Essential (primary) hypertension: Secondary | ICD-10-CM

## 2021-07-16 MED ORDER — OMEPRAZOLE 40 MG PO CPDR: DELAYED_RELEASE_CAPSULE | Freq: Every day | ORAL | 1 refills | Status: DC

## 2021-07-16 MED ORDER — OZEMPIC (1 MG/DOSE) 4 MG/3ML ~~LOC~~ SOPN: 1.0000 mg | PEN_INJECTOR | SUBCUTANEOUS | 0 refills | Status: DC

## 2021-07-16 MED ORDER — METOPROLOL TARTRATE 25 MG PO TABS: 12.5000 mg | ORAL_TABLET | Freq: Two times a day (BID) | ORAL | 0 refills | Status: DC

## 2021-07-16 NOTE — Addendum Note (Signed)
Addended by: Nilda Riggs on: 07/16/2021 03:57 PM   Modules accepted: Orders

## 2021-07-17 ENCOUNTER — Encounter: Payer: Self-pay | Admitting: Internal Medicine

## 2021-07-19 ENCOUNTER — Telehealth: Payer: Self-pay

## 2021-07-19 NOTE — Telephone Encounter (Signed)
-----   Message from Sammuel Cooper, PA-C sent at 07/18/2021 10:48 AM EST ----- Regarding: Capsule Capsule was incomplete on this pt - retained in the stomach for entire study Capsule was just done this week - Very Recent EGD Jan 23 was normal .  Report is in your folder in my office   Bonita Quin - if pt does not pass capsule this week - needs KUB early next week

## 2021-07-19 NOTE — Telephone Encounter (Signed)
Left message for pt to call back  °

## 2021-07-19 NOTE — Telephone Encounter (Signed)
Patient returned your call, please advise. 

## 2021-07-19 NOTE — Telephone Encounter (Signed)
Spoke with pt and he reports he did pass the capsule.

## 2021-07-19 NOTE — Telephone Encounter (Signed)
Pt notified via mychart that the capsule was incomplete, capsule remained in the stomach during the whole exam. He was intructed to complete the stool cards and to keep his OV as scheduled.

## 2021-07-19 NOTE — Telephone Encounter (Signed)
Great. No new recommendations at this time (see results note from July 03, 2021).

## 2021-07-20 ENCOUNTER — Other Ambulatory Visit: Payer: Self-pay

## 2021-07-20 DIAGNOSIS — T184XXA Foreign body in colon, initial encounter: Secondary | ICD-10-CM

## 2021-07-23 ENCOUNTER — Ambulatory Visit (INDEPENDENT_AMBULATORY_CARE_PROVIDER_SITE_OTHER)
Admission: RE | Admit: 2021-07-23 | Discharge: 2021-07-23 | Disposition: A | Discharge status: Home / Self Care | Payer: 59 | Source: Ambulatory Visit | Attending: Internal Medicine | Admitting: Internal Medicine

## 2021-07-23 ENCOUNTER — Other Ambulatory Visit: Payer: Self-pay

## 2021-07-23 DIAGNOSIS — K5900 Constipation, unspecified: Secondary | ICD-10-CM | POA: Diagnosis not present

## 2021-07-23 DIAGNOSIS — T184XXA Foreign body in colon, initial encounter: Secondary | ICD-10-CM | POA: Diagnosis not present

## 2021-08-10 ENCOUNTER — Other Ambulatory Visit: Payer: Self-pay

## 2021-08-10 ENCOUNTER — Other Ambulatory Visit (INDEPENDENT_AMBULATORY_CARE_PROVIDER_SITE_OTHER): Payer: 59

## 2021-08-10 DIAGNOSIS — D509 Iron deficiency anemia, unspecified: Secondary | ICD-10-CM | POA: Diagnosis not present

## 2021-08-10 LAB — CBC WITH DIFFERENTIAL/PLATELET
Basophils Absolute: 0 10*3/uL (ref 0.0–0.1)
Basophils Relative: 0.6 % (ref 0.0–3.0)
Eosinophils Absolute: 0.1 10*3/uL (ref 0.0–0.7)
Eosinophils Relative: 1.2 % (ref 0.0–5.0)
HCT: 39.9 % (ref 39.0–52.0)
Hemoglobin: 12.6 g/dL — ABNORMAL LOW (ref 13.0–17.0)
Lymphocytes Relative: 45.8 % (ref 12.0–46.0)
Lymphs Abs: 2 10*3/uL (ref 0.7–4.0)
MCHC: 31.6 g/dL (ref 30.0–36.0)
MCV: 69.6 fl — ABNORMAL LOW (ref 78.0–100.0)
Monocytes Absolute: 0.3 10*3/uL (ref 0.1–1.0)
Monocytes Relative: 6.1 % (ref 3.0–12.0)
Neutro Abs: 2 10*3/uL (ref 1.4–7.7)
Neutrophils Relative %: 46.3 % (ref 43.0–77.0)
Platelets: 290 10*3/uL (ref 150.0–400.0)
RBC: 5.73 Mil/uL (ref 4.22–5.81)
RDW: 16 % — ABNORMAL HIGH (ref 11.5–15.5)
WBC: 4.3 10*3/uL (ref 4.0–10.5)

## 2021-08-10 LAB — FERRITIN: Ferritin: 19 ng/mL — ABNORMAL LOW (ref 22.0–322.0)

## 2021-08-11 ENCOUNTER — Telehealth: Payer: 59 | Admitting: Nurse Practitioner

## 2021-08-11 DIAGNOSIS — M545 Low back pain, unspecified: Secondary | ICD-10-CM | POA: Diagnosis not present

## 2021-08-11 MED ORDER — CYCLOBENZAPRINE HCL 10 MG PO TABS: 10.0000 mg | ORAL_TABLET | Freq: Three times a day (TID) | ORAL | 0 refills | Status: DC | PRN

## 2021-08-11 MED ORDER — NAPROXEN 500 MG PO TABS: 500.0000 mg | ORAL_TABLET | Freq: Two times a day (BID) | ORAL | 0 refills | Status: DC

## 2021-08-11 NOTE — Progress Notes (Signed)
We are sorry that you are not feeling well.  Here is how we plan to help!  Based on what you have shared with me it looks like you mostly have acute back pain.  Acute back pain is defined as musculoskeletal pain that can resolve in 1-3 weeks with conservative treatment.  I have prescribed Naprosyn 500 mg take one by mouth twice a day non-steroid anti-inflammatory (NSAID) as well as Flexeril 10 mg every eight hours as needed which is a muscle relaxer .  You can use Tylenol for breakthrough pain, you should NOT use ibuprofen while using Naprosyn.   Some patients experience stomach irritation or in increased heartburn with anti-inflammatory drugs.  Please keep in mind that muscle relaxer's can cause fatigue and should not be taken while at work or driving.  Back pain is very common.  The pain often gets better over time.  The cause of back pain is usually not dangerous.  Most people can learn to manage their back pain on their own.  Home Care Stay active.  Start with short walks on flat ground if you can.  Try to walk farther each day. Do not sit, drive or stand in one place for more than 30 minutes.  Do not stay in bed. Do not avoid exercise or work.  Activity can help your back heal faster. Be careful when you bend or lift an object.  Bend at your knees, keep the object close to you, and do not twist. Sleep on a firm mattress.  Lie on your side, and bend your knees.  If you lie on your back, put a pillow under your knees. Only take medicines as told by your doctor. Put ice on the injured area. Put ice in a plastic bag Place a towel between your skin and the bag Leave the ice on for 15-20 minutes, 3-4 times a day for the first 2-3 days. 210 After that, you can switch between ice and heat packs. Ask your doctor about back exercises or massage. Avoid feeling anxious or stressed.  Find good ways to deal with stress, such as exercise.  Get Help Right Way If: Your pain does not go away with rest  or medicine. Your pain does not go away in 1 week. You have new problems. You do not feel well. The pain spreads into your legs. You cannot control when you poop (bowel movement) or pee (urinate) You feel sick to your stomach (nauseous) or throw up (vomit) You have belly (abdominal) pain. You feel like you may pass out (faint). If you develop a fever.  Make Sure you: Understand these instructions. Will watch your condition Will get help right away if you are not doing well or get worse.  Your e-visit answers were reviewed by a board certified advanced clinical practitioner to complete your personal care plan.  Depending on the condition, your plan could have included both over the counter or prescription medications.  If there is a problem please reply  once you have received a response from your provider.  Your safety is important to [place].  If you have drug allergies check your prescription carefully.    You can use MyChart to ask questions about todays visit, request a non-urgent call back, or ask for a work or school excuse for 24 hours related to this e-Visit. If it has been greater than 24 hours you will need to follow up with your provider, or enter a new e-Visit to address those concerns.  You will get an e-mail in the next two days asking about your experience.  I hope that your e-visit has been valuable and will speed your recovery. Thank you for using e-visits.   I spent approximately 7 minutes reviewing the patient's history, current symptoms and coordinating their plan of care today.    Meds ordered this encounter  Medications   naproxen (NAPROSYN) 500 MG tablet    Sig: Take 1 tablet (500 mg total) by mouth 2 (two) times daily with a meal.    Dispense:  30 tablet    Refill:  0   cyclobenzaprine (FLEXERIL) 10 MG tablet    Sig: Take 1 tablet (10 mg total) by mouth 3 (three) times daily as needed for muscle spasms.    Dispense:  30 tablet    Refill:  0

## 2021-08-14 ENCOUNTER — Encounter: Payer: Self-pay | Admitting: Internal Medicine

## 2021-08-14 ENCOUNTER — Other Ambulatory Visit (INDEPENDENT_AMBULATORY_CARE_PROVIDER_SITE_OTHER): Payer: 59

## 2021-08-14 ENCOUNTER — Ambulatory Visit: Payer: 59 | Admitting: Internal Medicine

## 2021-08-14 VITALS — BP 118/80 | HR 84 | Ht 71.0 in | Wt 232.0 lb

## 2021-08-14 DIAGNOSIS — Z800 Family history of malignant neoplasm of digestive organs: Secondary | ICD-10-CM | POA: Diagnosis not present

## 2021-08-14 DIAGNOSIS — D509 Iron deficiency anemia, unspecified: Secondary | ICD-10-CM

## 2021-08-14 DIAGNOSIS — K219 Gastro-esophageal reflux disease without esophagitis: Secondary | ICD-10-CM | POA: Diagnosis not present

## 2021-08-14 LAB — BASIC METABOLIC PANEL
BUN: 12 mg/dL (ref 6–23)
CO2: 32 mEq/L (ref 19–32)
Calcium: 9.9 mg/dL (ref 8.4–10.5)
Chloride: 101 mEq/L (ref 96–112)
Creatinine, Ser: 1.12 mg/dL (ref 0.40–1.50)
GFR: 79.37 mL/min (ref >60.00)
Glucose, Bld: 148 mg/dL — ABNORMAL HIGH (ref 70–99)
Potassium: 3.7 mEq/L (ref 3.5–5.1)
Sodium: 140 mEq/L (ref 135–145)

## 2021-08-14 NOTE — Progress Notes (Signed)
HISTORY OF PRESENT ILLNESS:  Reginald Sevey. is a 46 y.o. male who I have followed for colon cancer screening due to a family history of colon cancer in his father less than age 108.  He underwent colonoscopy December 2018 which revealed diverticulosis and hemorrhoids.  He was subsequently seen in this office May 16, 2021 by the GI physician assistant regarding mild iron deficiency with normal hemoglobin though 1 g less than baseline.  His chief complaint at that time was fatigue he was having active reflux symptoms.  He subsequently underwent both colonoscopy and upper endoscopy July 02, 2021.  Colonoscopy was normal except for diverticulosis and hemorrhoids.  Upper endoscopy was unremarkable.  Biopsies of the ileum were normal.  No evidence for celiac disease.  He had been on iron replacement therapy which was continued.  He was also continued on recently started omeprazole for reflux.  He was scheduled for capsule endoscopy which was performed.  Unfortunately, there was capsule retention in the stomach throughout the entirety of the exam.  Follow-up imaging did show that he passed the capsule.  Hemoccult testing was negative.  Most recent blood work from August 10, 2021 showed mild anemia with hemoglobin 12.6.  Iron deficiency with ferritin 19.0.  He presents today for previously scheduled follow-up.  I had requested hematology consultation.  Patient tells me he has no GI complaints.  He continues on iron twice daily.  He continues on omeprazole with good control of reflux symptoms.  GI review of systems is negative.  His only complaints are fatigue and a sensation of feeling cold.    REVIEW OF SYSTEMS:  All non-GI ROS negative unless otherwise stated in the HPI except for arthritis, urinary leakage  Past Medical History:  Diagnosis Date   Anxiety    Arthritis    DDD  BACK   Depression    Diabetes mellitus    GERD (gastroesophageal reflux disease)    Headache    HX MIGRAINES     Hyperlipidemia    Hypertension    Sinusitis     Past Surgical History:  Procedure Laterality Date   CARDIAC CATHETERIZATION     2006    OKAY    epidural injections   2002   LUMBAR LAMINECTOMY/DECOMPRESSION MICRODISCECTOMY N/A 10/13/2015   Procedure: L3-4 L4-5 L5-S1 Laminectomy;  Surgeon: Barnett Abu, MD;  Location: [facility];  Service: Neurosurgery;  Laterality: N/A;  L3-4 L4-5 L5-S1 Laminectomy    Social History Reginald Kirby.  reports that he has never smoked. He has never used smokeless tobacco. He reports that he does not currently use alcohol. He reports that he does not use drugs.  family history includes Colon cancer (age of onset: 35) in his father; Diabetes in his brother; Heart disease in his mother.  Allergies  Allergen Reactions   Demerol Itching       PHYSICAL EXAMINATION: Vital signs: BP 118/80    Pulse 84    Ht 5\' 11"  (1.803 m)    Wt 232 lb (105.2 kg)    SpO2 99%    BMI 32.36 kg/m   Constitutional: generally well-appearing, no acute distress Psychiatric: alert and oriented x3, cooperative Eyes: extraocular movements intact, anicteric, conjunctiva pink Mouth: oral pharynx moist, no lesions Neck: supple no lymphadenopathy Cardiovascular: heart regular rate and rhythm, no murmur Lungs: clear to auscultation bilaterally Abdomen: soft, nontender, nondistended, no obvious ascites, no peritoneal signs, normal bowel sounds, no organomegaly Rectal: Omitted Extremities: no clubbing, cyanosis, or  lower extremity edema bilaterally Skin: no lesions on visible extremities.  Tattoos Neuro: No focal deficits.  Cranial nerves intact  ASSESSMENT:  1.  Mild iron deficiency anemia.  Unremarkable colonoscopy and upper endoscopy.  Negative duodenal biopsies.  Incomplete capsule endoscopy.  On twice daily iron.  Hemoccult testing negative.  No GI complaints. 2.  GERD.  Normal endoscopy.  Symptoms controlled with omeprazole 40 mg daily 3.  Family history of colon cancer.   Negative for neoplasia colonoscopy 2018 and 2023  PLAN:  1.  Continue oral iron therapy 2.  Schedule small bowel enterography to small out small bowel lesion 3.  Hematology evaluation.  We will arrange 4.  Reflux precautions 5.  Continue omeprazole 6.  Surveillance colonoscopy around January 2028 7.  Interval follow-up as needed A total time of 30 minutes was spent preparing to see the patient, reviewing tests, x-rays, and endoscopy reports.  Obtaining interval history, performing medically appropriate physical examination, counseling and educating the patient regarding the above listed issues, ordering advanced radiology studies and consultation request, and documenting clinical information in the health record

## 2021-08-14 NOTE — Patient Instructions (Addendum)
If you are age 46 or older, your body mass index should be between 23-30. Your Body mass index is 32.36 kg/m. If this is out of the aforementioned range listed, please consider follow up with your Primary Care Provider.  If you are age 24 or younger, your body mass index should be between 19-25. Your Body mass index is 32.36 kg/m. If this is out of the aformentioned range listed, please consider follow up with your Primary Care Provider.   ________________________________________________________  The [facility] providers would like to encourage you to use [facility] to communicate with providers for non-urgent requests or questions.  Due to long hold times on the telephone, sending your provider a message by [facility] may be a faster and more efficient way to get a response.  Please allow 48 business hours for a response.  Please remember that this is for non-urgent requests.  ______________________________________________________ Your provider has requested that you go to the basement level for lab work before leaving today. Press "B" on the elevator. The lab is located at the first door on the left as you exit the elevator.  You have been scheduled for a CT scan of the abdomen and pelvis at [facility], 1st floor Radiology. You are scheduled on 08/21/2021  at 3:00pm. You should arrive 90 minutes prior to your appointment time for registration.   Please follow the written instructions below on the day of your exam:   1) Do not eat anything after 11;00am (4 hours prior to your test)   You may take any medications as prescribed with a small amount of water, if necessary. If you take any of the following medications: METFORMIN, GLUCOPHAGE, GLUCOVANCE, AVANDAMET, RIOMET, FORTAMET, [place] MET, JANUMET, GLUMETZA or METAGLIP, you MAY be asked to HOLD this medication 48 hours AFTER the exam.   The purpose of you drinking the oral contrast is to aid in the visualization of your intestinal tract. The  contrast solution may cause some diarrhea. Depending on your individual set of symptoms, you may also receive an intravenous injection of x-ray contrast/dye. Plan on being at [facility] for 45 minutes or longer, depending on the type of exam you are having performed.   If you have any questions regarding your exam or if you need to reschedule, you may call [facility] at [phone] between the hours of 8:00 am and 5:00 pm, Monday-Friday.   You have been referred to Hematology - they will reach out to you to schedule.

## 2021-08-15 ENCOUNTER — Other Ambulatory Visit: Payer: Self-pay | Admitting: *Deleted

## 2021-08-15 DIAGNOSIS — D509 Iron deficiency anemia, unspecified: Secondary | ICD-10-CM

## 2021-08-15 NOTE — Progress Notes (Signed)
New patient appt scheduled for 3/31 and labs 2 days prior.  Lab orders entered ?

## 2021-08-16 ENCOUNTER — Telehealth: Payer: Self-pay | Admitting: Nurse Practitioner

## 2021-08-16 NOTE — Telephone Encounter (Signed)
Contacted patient to schedule initial visit from referral , he is scheduled for lab 3/23 and office visit on 3/31. He is ware of dates and times. ?

## 2021-08-20 ENCOUNTER — Ambulatory Visit (HOSPITAL_COMMUNITY)
Admission: RE | Admit: 2021-08-20 | Discharge: 2021-08-20 | Disposition: A | Discharge status: Home / Self Care | Payer: 59 | Source: Ambulatory Visit | Attending: Internal Medicine | Admitting: Internal Medicine

## 2021-08-20 ENCOUNTER — Encounter (HOSPITAL_COMMUNITY): Payer: Self-pay

## 2021-08-20 ENCOUNTER — Other Ambulatory Visit: Payer: Self-pay

## 2021-08-20 DIAGNOSIS — D509 Iron deficiency anemia, unspecified: Secondary | ICD-10-CM | POA: Diagnosis not present

## 2021-08-20 DIAGNOSIS — K5730 Diverticulosis of large intestine without perforation or abscess without bleeding: Secondary | ICD-10-CM | POA: Diagnosis not present

## 2021-08-20 MED ORDER — BARIUM SULFATE 0.1 % PO SUSP
ORAL | Status: AC
  Filled 2021-08-20: qty 3

## 2021-08-20 MED ORDER — IOHEXOL 300 MG/ML  SOLN
100.0000 mL | Freq: Once | INTRAMUSCULAR | Status: AC | PRN
  Administered 2021-08-20: 100 mL via INTRAVENOUS

## 2021-08-21 ENCOUNTER — Ambulatory Visit (HOSPITAL_COMMUNITY): Payer: 59

## 2021-08-29 ENCOUNTER — Other Ambulatory Visit: Payer: Self-pay | Admitting: *Deleted

## 2021-08-29 DIAGNOSIS — D509 Iron deficiency anemia, unspecified: Secondary | ICD-10-CM

## 2021-09-06 ENCOUNTER — Inpatient Hospital Stay: Payer: 59

## 2021-09-12 ENCOUNTER — Inpatient Hospital Stay: Payer: 59 | Attending: Nurse Practitioner

## 2021-09-12 ENCOUNTER — Other Ambulatory Visit: Payer: Self-pay

## 2021-09-12 DIAGNOSIS — D509 Iron deficiency anemia, unspecified: Secondary | ICD-10-CM | POA: Insufficient documentation

## 2021-09-12 LAB — CBC WITH DIFFERENTIAL (CANCER CENTER ONLY)
Abs Immature Granulocytes: 0.01 10*3/uL (ref 0.00–0.07)
Basophils Absolute: 0 10*3/uL (ref 0.0–0.1)
Basophils Relative: 0 %
Eosinophils Absolute: 0 10*3/uL (ref 0.0–0.5)
Eosinophils Relative: 1 %
HCT: 43.1 % (ref 39.0–52.0)
Hemoglobin: 13.1 g/dL (ref 13.0–17.0)
Immature Granulocytes: 0 %
Lymphocytes Relative: 44 %
Lymphs Abs: 2 10*3/uL (ref 0.7–4.0)
MCH: 22.4 pg — ABNORMAL LOW (ref 26.0–34.0)
MCHC: 30.4 g/dL (ref 30.0–36.0)
MCV: 73.7 fL — ABNORMAL LOW (ref 80.0–100.0)
Monocytes Absolute: 0.3 10*3/uL (ref 0.1–1.0)
Monocytes Relative: 6 %
Neutro Abs: 2.2 10*3/uL (ref 1.7–7.7)
Neutrophils Relative %: 49 %
Platelet Count: 314 10*3/uL (ref 150–400)
RBC: 5.85 MIL/uL — ABNORMAL HIGH (ref 4.22–5.81)
RDW: 14.5 % (ref 11.5–15.5)
WBC Count: 4.5 10*3/uL (ref 4.0–10.5)
nRBC: 0 % (ref 0.0–0.2)

## 2021-09-12 LAB — SAVE SMEAR(SSMR), FOR PROVIDER SLIDE REVIEW

## 2021-09-13 NOTE — Progress Notes (Signed)
New Hematology/Oncology Consult ? ? ?Requesting MD: Dr. Yancey FlemingsJohn Kirby ? ?[phone]   ? ?Reason for Consult: Iron deficiency anemia ? ?HPI: [name] has been referred for evaluation of iron deficiency anemia.  He is followed by Dr. Marina GoodellPerry for colon cancer screening due to a family history of colon cancer.  He was found to have iron deficiency in November of this year when the hemoglobin returned at 13 (13.2-17.1), MCV 67, RDW 17.1, platelet count 430,000, ferritin 12.4.  He recalls onset of fatigue and feeling cold around that time.  He underwent underwent both colonoscopy and upper endoscopy 07/02/2021.  Colonoscopy was normal except for diverticulosis and hemorrhoids.  Upper endoscopy was unremarkable.  Biopsies of the ileum were normal.  No evidence for celiac disease.  He underwent a capsule endoscopy but the capsule was retained in the stomach throughout the entirety of the exam.  Hemoccult cards negative 07/11/2021.  Most recent CBC and ferritin from 08/10/2021-hemoglobin 12.6, MCV 69, RDW 16, ferritin 19.  He has continued oral iron therapy.  He has been taking oral iron twice daily since January of this year.  He takes a stool softener every other day and is having no significant constipation.  He is not aware of any bleeding. ? ?Review of prior labs in the EMR-09/10/2019 hemoglobin 13.2, MCV 71; 11/25/2017 hemoglobin 14.5, MCV 71; most remote 10/23/2011 hemoglobin 14.1, MCV 73. ? ? ?Past Medical History:  ?Diagnosis Date  ? Anxiety   ? Arthritis   ? DDD  BACK  ? Depression   ? Diabetes mellitus   ? GERD (gastroesophageal reflux disease)   ? Headache   ? HX MIGRAINES   ? Hyperlipidemia   ? Hypertension   ? Sinusitis   ? ? ? ?Past Surgical History:  ?Procedure Laterality Date  ? CARDIAC CATHETERIZATION    ? 2006    OKAY   ? epidural injections   2002  ? LUMBAR LAMINECTOMY/DECOMPRESSION MICRODISCECTOMY N/A 10/13/2015  ? Procedure: L3-4 L4-5 L5-S1 Laminectomy;  Surgeon: Barnett AbuHenry Elsner, MD;  Location: [facility];   Service: Neurosurgery;  Laterality: N/A;  L3-4 L4-5 L5-S1 Laminectomy  ? ? ? ?Current Outpatient Medications:  ?  AMBULATORY NON FORMULARY MEDICATION, 2 each daily. Medication Name: Super Beets Heart chews, Disp: , Rfl:  ?  amLODipine (NORVASC) 10 MG tablet, Take 1 tablet (10 mg total) by mouth daily., Disp: 90 tablet, Rfl: 0 ?  atorvastatin (LIPITOR) 80 MG tablet, Take 1 tablet (80 mg total) by mouth daily., Disp: 90 tablet, Rfl: 0 ?  buPROPion (WELLBUTRIN XL) 300 MG 24 hr tablet, TAKE 1 TABLET (300 MG TOTAL) BY MOUTH DAILY., Disp: 90 tablet, Rfl: 0 ?  cyclobenzaprine (FLEXERIL) 10 MG tablet, Take 1 tablet (10 mg total) by mouth 3 (three) times daily as needed for muscle spasms., Disp: 30 tablet, Rfl: 0 ?  ferrous sulfate 325 (65 FE) MG tablet, Take 325 mg by mouth 2 (two) times daily with a meal., Disp: , Rfl:  ?  glipiZIDE (GLUCOTROL XL) 5 MG 24 hr tablet, Take 1 tablet (5 mg total) by mouth daily with breakfast., Disp: 90 tablet, Rfl: 0 ?  glucose blood (FREESTYLE LITE) test strip, 1 each by Other route daily. Dx E11.9, Disp: 100 each, Rfl: 3 ?  losartan-hydrochlorothiazide (HYZAAR) 100-25 MG tablet, TAKE 1 TABLET BY MOUTH ONCE DAILY., Disp: 90 tablet, Rfl: 1 ?  metFORMIN (GLUCOPHAGE) 500 MG tablet, Take 1 tablet (500 mg total) by mouth 2 (two) times daily with a meal.,  Disp: 180 tablet, Rfl: 0 ?  metoprolol tartrate (LOPRESSOR) 25 MG tablet, Take 0.5 tablets (12.5 mg total) by mouth 2 (two) times daily., Disp: 90 tablet, Rfl: 0 ?  omeprazole (PRILOSEC) 40 MG capsule, TAKE 1 CAPSULE (40 MG TOTAL) BY MOUTH DAILY., Disp: 90 capsule, Rfl: 1 ?  Semaglutide, 1 MG/DOSE, (OZEMPIC, 1 MG/DOSE,) 4 MG/3ML SOPN, Inject 1 mg as directed once a week., Disp: 9 mL, Rfl: 0: ? ? ? ?Allergies  ?Allergen Reactions  ? Demerol Itching  ? ? ?FH: Father deceased with rectal cancer, schizophrenia; mother has a pacemaker, history of superficial bladder cancer, hypothyroid; brother with hypertension, 2 sisters with hypertension and  polycystic ovary syndrome, maternal grandmother pancreas cancer ? ?SOCIAL HISTORY: He lives in [place].  He is married.  He has 2 sons and a daughter.  Daughter is anemic.  He is a Publishing rights manager.  No tobacco or alcohol use. ? ?Review of Systems: He denies bleeding.  No black stools.  He began feeling fatigued and "cold" in October 2022.  He does not crave ice.  He eats a normal diet.  Nails are not brittle.  No mouth, tongue, lip sores.  He has a good appetite.  He has lost weight since beginning Ozempic.  No shortness of breath.  No dysphagia.  He had nausea when he first began Ozempic, no longer experiencing this.  No change in bowel habits.  No hematuria.  Occasional neuropathy pain in the feet. ? ?Physical Exam: ? ?Blood pressure (!) 144/94, pulse 81, temperature 97.8 ?F (36.6 ?C), temperature source Oral, resp. rate 18, height 5\' 11"  (1.803 m), weight 229 lb 9.6 oz (104.1 kg), SpO2 98 %. ? ?HEENT: Sclera anicteric.  No thrush or ulcers. ?Lungs: Lungs clear bilaterally. ?Cardiac: Regular rate and rhythm. ?Abdomen: Abdomen soft and nontender.  No hepatosplenomegaly. ?Vascular: No leg edema. ?Lymph nodes: No palpable cervical, supraclavicular, axillary or inguinal lymph nodes. ?Skin: No rash. ? ?LABS: ? ? ?Recent Labs  ?  09/12/21 ?1332  ?WBC 4.5  ?HGB 13.1  ?HCT 43.1  ?PLT 314  ?Peripheral blood smear-polychromasia not increased, moderate ovalocytes, rare target, rare teardrop, few acanthocytes, red cells are microcytic; white blood cell morphology is unremarkable, majority of the white cells are mature appearing lymphocytes and neutrophils, no blasts or other young forms; platelets normal in number, scattered large platelets ? ?No results for input(s): NA, K, CL, CO2, GLUCOSE, BUN, CREATININE, CALCIUM in the last 72 hours. ? ? ? ?RADIOLOGY: ? ?CT ENTERO ABD/PELVIS W CONTAST ? ?Result Date: 08/21/2021 ?CLINICAL DATA:  Iron deficiency anemia. EXAM: CT ABDOMEN AND PELVIS WITH CONTRAST (ENTEROGRAPHY)  TECHNIQUE: Multidetector CT of the abdomen and pelvis during bolus administration of intravenous contrast. Negative oral contrast was given. RADIATION DOSE REDUCTION: This exam was performed according to the departmental dose-optimization program which includes automated exposure control, adjustment of the mA and/or kV according to patient size and/or use of iterative reconstruction technique. CONTRAST:  10/21/2021 OMNIPAQUE IOHEXOL 300 MG/ML  SOLN COMPARISON:  None. FINDINGS: Lower Chest: No acute findings. Hepatobiliary: No hepatic masses identified. Gallbladder is unremarkable. No evidence of biliary ductal dilatation. Pancreas:  No mass or inflammatory changes. Spleen: Within normal limits in size and appearance. Adrenals/Urinary Tract: No masses identified. No evidence of ureteral calculi or hydronephrosis. Stomach/Bowel: No evidence of bowel wall thickening, abnormal contrast enhancement, or mesenteric inflammatory changes. No evidence of stricture or dilatation. No signs of penetrating disease, fistulae, or abnormal fluid collections. The terminal ileum is normal in appearance. Normal  appendix visualized. A few diverticula are seen in the proximal sigmoid colon, however there is no evidence of diverticulitis. Vascular/Lymphatic: No pathologically enlarged lymph nodes. No acute vascular findings. Reproductive:  No mass or other significant abnormality. Other:  None. Musculoskeletal:  No suspicious bone lesions identified. IMPRESSION: No radiographic evidence of inflammatory bowel disease or other acute findings. Mild sigmoid diverticulosis. No radiographic evidence of diverticulitis. Electronically Signed   By: Danae Orleans M.D.   On: 08/21/2021 08:33   ? ?Assessment and Plan:  ? ?Red cell microcytosis, dating to at least 2013 ?Iron deficiency ?Colonoscopy and upper endoscopy 07/02/2021-colonoscopy normal except for diverticulosis and hemorrhoids; upper endoscopy unremarkable, biopsies of the ileum normal, no  evidence for celiac disease ?Stool cards negative 07/11/2021 ?Capsule endoscopy attempted but unsuccessful ?CT abdomen/pelvis 08/20/2021-no radiographic evidence of inflammatory bowel disease or other acute findings.  Mild sigmoid

## 2021-09-14 ENCOUNTER — Encounter: Payer: Self-pay | Admitting: Nurse Practitioner

## 2021-09-14 ENCOUNTER — Inpatient Hospital Stay: Payer: 59 | Admitting: Nurse Practitioner

## 2021-09-14 ENCOUNTER — Inpatient Hospital Stay: Payer: 59

## 2021-09-14 ENCOUNTER — Other Ambulatory Visit (HOSPITAL_BASED_OUTPATIENT_CLINIC_OR_DEPARTMENT_OTHER): Payer: Self-pay

## 2021-09-14 VITALS — BP 144/94 | HR 81 | Temp 97.8°F | Resp 18 | Ht 71.0 in | Wt 229.6 lb

## 2021-09-14 DIAGNOSIS — I10 Essential (primary) hypertension: Secondary | ICD-10-CM

## 2021-09-14 DIAGNOSIS — D509 Iron deficiency anemia, unspecified: Secondary | ICD-10-CM

## 2021-09-14 DIAGNOSIS — E119 Type 2 diabetes mellitus without complications: Secondary | ICD-10-CM | POA: Diagnosis not present

## 2021-09-14 DIAGNOSIS — R718 Other abnormality of red blood cells: Secondary | ICD-10-CM | POA: Diagnosis not present

## 2021-09-14 DIAGNOSIS — E785 Hyperlipidemia, unspecified: Secondary | ICD-10-CM | POA: Diagnosis not present

## 2021-09-14 LAB — URINALYSIS, COMPLETE (UACMP) WITH MICROSCOPIC
Bilirubin Urine: NEGATIVE
Glucose, UA: NEGATIVE mg/dL
Hgb urine dipstick: NEGATIVE
Ketones, ur: NEGATIVE mg/dL
Leukocytes,Ua: NEGATIVE
Nitrite: NEGATIVE
Protein, ur: 30 mg/dL — AB
Specific Gravity, Urine: 1.027 (ref 1.005–1.030)
pH: 6.5 (ref 5.0–8.0)

## 2021-09-14 LAB — FERRITIN: Ferritin: 22 ng/mL — ABNORMAL LOW (ref 24–336)

## 2021-09-17 ENCOUNTER — Telehealth: Payer: Self-pay

## 2021-09-17 NOTE — Telephone Encounter (Signed)
Patient gave verbal understanding ?

## 2021-09-17 NOTE — Telephone Encounter (Signed)
-----   Message from Rana Snare, NP sent at 09/14/2021  2:54 PM EDT ----- ?Please let him know urine is negative for blood, follow-up as scheduled ? ?

## 2021-09-18 ENCOUNTER — Telehealth: Payer: Self-pay | Admitting: Internal Medicine

## 2021-09-18 DIAGNOSIS — I10 Essential (primary) hypertension: Secondary | ICD-10-CM

## 2021-09-18 MED ORDER — METOPROLOL TARTRATE 25 MG PO TABS: 12.5000 mg | ORAL_TABLET | Freq: Two times a day (BID) | ORAL | 1 refills | Status: DC

## 2021-09-18 NOTE — Telephone Encounter (Signed)
Refill sent.

## 2021-09-18 NOTE — Telephone Encounter (Signed)
Courtney from [employer] called to ask for RX refill Metoprolol Tartrate 25 MG tablet to be sent to [facility] - [place], [place] - [address] ? ?Please advise.  ?

## 2021-09-19 ENCOUNTER — Telehealth: Payer: Self-pay

## 2021-09-19 NOTE — Telephone Encounter (Signed)
Called and left a vm to let him know his ferritin is slightly improved , continue oral iron and f/u as scheduled.  ?

## 2021-09-19 NOTE — Telephone Encounter (Signed)
-----   Message from Rana Snare, NP sent at 09/19/2021  7:41 AM EDT ----- ?Please let him know ferritin is slightly improved, continue oral iron and f/u as scheduled ? ?

## 2021-09-30 ENCOUNTER — Other Ambulatory Visit: Payer: Self-pay | Admitting: Internal Medicine

## 2021-09-30 DIAGNOSIS — E119 Type 2 diabetes mellitus without complications: Secondary | ICD-10-CM

## 2021-10-01 ENCOUNTER — Other Ambulatory Visit (HOSPITAL_BASED_OUTPATIENT_CLINIC_OR_DEPARTMENT_OTHER): Payer: Self-pay

## 2021-10-01 ENCOUNTER — Telehealth: Payer: 59 | Admitting: Nurse Practitioner

## 2021-10-01 DIAGNOSIS — R112 Nausea with vomiting, unspecified: Secondary | ICD-10-CM

## 2021-10-01 MED ORDER — ONDANSETRON HCL 4 MG PO TABS: 4.0000 mg | ORAL_TABLET | Freq: Three times a day (TID) | ORAL | 0 refills | Status: DC | PRN

## 2021-10-01 MED ORDER — OZEMPIC (1 MG/DOSE) 4 MG/3ML ~~LOC~~ SOPN
1.0000 mg | PEN_INJECTOR | SUBCUTANEOUS | 0 refills | Status: DC
  Filled 2021-10-01: qty 9, 84d supply, fill #0

## 2021-10-01 NOTE — Progress Notes (Signed)
E-Visit for Nausea and Vomiting   We are sorry that you are not feeling well. Here is how we plan to help!  Based on what you have shared with me it looks like you have a Virus that is irritating your GI tract.  Vomiting is the forceful emptying of a portion of the stomach's content through the mouth.  Although nausea and vomiting can make you feel miserable, it's important to remember that these are not diseases, but rather symptoms of an underlying illness.  When we treat short term symptoms, we always caution that any symptoms that persist should be fully evaluated in a medical office.  I have prescribed a medication that will help alleviate your symptoms and allow you to stay hydrated:  Zofran 4 mg 1 tablet every 8 hours as needed for nausea and vomiting  HOME CARE: Drink clear liquids.  This is very important! Dehydration (the lack of fluid) can lead to a serious complication.  Start off with 1 tablespoon every 5 minutes for 8 hours. You may begin eating bland foods after 8 hours without vomiting.  Start with saltine crackers, white bread, rice, mashed potatoes, applesauce. After 48 hours on a bland diet, you may resume a normal diet. Try to go to sleep.  Sleep often empties the stomach and relieves the need to vomit.  GET HELP RIGHT AWAY IF:  Your symptoms do not improve or worsen within 2 days after treatment. You have a fever for over 3 days. You cannot keep down fluids after trying the medication.  MAKE SURE YOU:  Understand these instructions. Will watch your condition. Will get help right away if you are not doing well or get worse.    Thank you for choosing an e-visit.  Your e-visit answers were reviewed by a board certified advanced clinical practitioner to complete your personal care plan. Depending upon the condition, your plan could have included both over the counter or prescription medications.  Please review your pharmacy choice. Make sure the pharmacy is open so  you can pick up prescription now. If there is a problem, you may contact your provider through MyChart messaging and have the prescription routed to another pharmacy.  Your safety is important to us. If you have drug allergies check your prescription carefully.   For the next 24 hours you can use MyChart to ask questions about today's visit, request a non-urgent call back, or ask for a work or school excuse. You will get an email in the next two days asking about your experience. I hope that your e-visit has been valuable and will speed your recovery.  5-10 minutes spent reviewing and documenting in chart.  

## 2021-11-01 DIAGNOSIS — M545 Low back pain, unspecified: Secondary | ICD-10-CM | POA: Diagnosis not present

## 2021-11-26 DIAGNOSIS — H0288A Meibomian gland dysfunction right eye, upper and lower eyelids: Secondary | ICD-10-CM | POA: Diagnosis not present

## 2021-11-26 DIAGNOSIS — H5213 Myopia, bilateral: Secondary | ICD-10-CM | POA: Diagnosis not present

## 2021-11-26 DIAGNOSIS — H40033 Anatomical narrow angle, bilateral: Secondary | ICD-10-CM | POA: Diagnosis not present

## 2021-11-26 DIAGNOSIS — H25813 Combined forms of age-related cataract, bilateral: Secondary | ICD-10-CM | POA: Diagnosis not present

## 2021-11-26 DIAGNOSIS — H40013 Open angle with borderline findings, low risk, bilateral: Secondary | ICD-10-CM | POA: Diagnosis not present

## 2021-11-26 DIAGNOSIS — H524 Presbyopia: Secondary | ICD-10-CM | POA: Diagnosis not present

## 2021-11-26 DIAGNOSIS — H52223 Regular astigmatism, bilateral: Secondary | ICD-10-CM | POA: Diagnosis not present

## 2021-11-26 DIAGNOSIS — E119 Type 2 diabetes mellitus without complications: Secondary | ICD-10-CM | POA: Diagnosis not present

## 2021-11-26 DIAGNOSIS — H0288B Meibomian gland dysfunction left eye, upper and lower eyelids: Secondary | ICD-10-CM | POA: Diagnosis not present

## 2021-11-26 DIAGNOSIS — H35033 Hypertensive retinopathy, bilateral: Secondary | ICD-10-CM | POA: Diagnosis not present

## 2021-11-26 LAB — HM DIABETES EYE EXAM

## 2021-11-27 ENCOUNTER — Encounter: Payer: Self-pay | Admitting: Internal Medicine

## 2021-11-28 ENCOUNTER — Ambulatory Visit: Payer: 59 | Admitting: Internal Medicine

## 2021-11-28 ENCOUNTER — Encounter: Payer: Self-pay | Admitting: Internal Medicine

## 2021-11-28 VITALS — BP 140/90 | HR 69 | Temp 98.2°F | Wt 215.5 lb

## 2021-11-28 DIAGNOSIS — E1169 Type 2 diabetes mellitus with other specified complication: Secondary | ICD-10-CM

## 2021-11-28 DIAGNOSIS — E785 Hyperlipidemia, unspecified: Secondary | ICD-10-CM

## 2021-11-28 DIAGNOSIS — I10 Essential (primary) hypertension: Secondary | ICD-10-CM | POA: Diagnosis not present

## 2021-11-28 LAB — COMPREHENSIVE METABOLIC PANEL
ALT: 23 U/L (ref 0–53)
AST: 18 U/L (ref 0–37)
Albumin: 4.6 g/dL (ref 3.5–5.2)
Alkaline Phosphatase: 73 U/L (ref 39–117)
BUN: 12 mg/dL (ref 6–23)
CO2: 31 mEq/L (ref 19–32)
Calcium: 10.8 mg/dL — ABNORMAL HIGH (ref 8.4–10.5)
Chloride: 98 mEq/L (ref 96–112)
Creatinine, Ser: 1.17 mg/dL (ref 0.40–1.50)
GFR: 75.16 mL/min (ref >60.00)
Glucose, Bld: 150 mg/dL — ABNORMAL HIGH (ref 70–99)
Potassium: 4 mEq/L (ref 3.5–5.1)
Sodium: 140 mEq/L (ref 135–145)
Total Bilirubin: 0.4 mg/dL (ref 0.2–1.2)
Total Protein: 7.6 g/dL (ref 6.0–8.3)

## 2021-11-28 LAB — POCT GLYCOSYLATED HEMOGLOBIN (HGB A1C): Hemoglobin A1C: 6.8 % — AB (ref 4.0–5.6)

## 2021-11-28 LAB — CBC WITH DIFFERENTIAL/PLATELET
Basophils Absolute: 0 10*3/uL (ref 0.0–0.1)
Basophils Relative: 0.2 % (ref 0.0–3.0)
Eosinophils Absolute: 0.1 10*3/uL (ref 0.0–0.7)
Eosinophils Relative: 3.2 % (ref 0.0–5.0)
HCT: 40.7 % (ref 39.0–52.0)
Hemoglobin: 13.1 g/dL (ref 13.0–17.0)
Lymphocytes Relative: 43 % (ref 12.0–46.0)
Lymphs Abs: 1.7 10*3/uL (ref 0.7–4.0)
MCHC: 32.2 g/dL (ref 30.0–36.0)
MCV: 73 fl — ABNORMAL LOW (ref 78.0–100.0)
Monocytes Absolute: 0.3 10*3/uL (ref 0.1–1.0)
Monocytes Relative: 6.3 % (ref 3.0–12.0)
Neutro Abs: 1.9 10*3/uL (ref 1.4–7.7)
Neutrophils Relative %: 47.3 % (ref 43.0–77.0)
Platelets: 330 10*3/uL (ref 150.0–400.0)
RBC: 5.58 Mil/uL (ref 4.22–5.81)
RDW: 14.2 % (ref 11.5–15.5)
WBC: 4.1 10*3/uL (ref 4.0–10.5)

## 2021-11-28 LAB — LIPID PANEL
Cholesterol: 97 mg/dL (ref 0–200)
HDL: 36.4 mg/dL — ABNORMAL LOW (ref >39.00)
LDL Cholesterol: 34 mg/dL (ref 0–99)
NonHDL: 60.96
Total CHOL/HDL Ratio: 3
Triglycerides: 133 mg/dL (ref 0.0–149.0)
VLDL: 26.6 mg/dL (ref 0.0–40.0)

## 2021-11-28 LAB — MICROALBUMIN / CREATININE URINE RATIO
Creatinine,U: 304.1 mg/dL
Microalb Creat Ratio: 5.2 mg/g (ref 0.0–30.0)
Microalb, Ur: 15.8 mg/dL — ABNORMAL HIGH (ref 0.0–1.9)

## 2021-11-28 NOTE — Progress Notes (Signed)
Established Patient Office Visit     CC/Reason for Visit: Follow-up chronic medical conditions  HPI: Reginald Kirby. is a 46 y.o. male who is coming in today for the above mentioned reasons. Past Medical History is significant for: Hypertension, hyperlipidemia, type 2 diabetes and depression.  During his annual labs he was found to be anemic, he saw GI and had updated upper and lower endoscopies without source of bleeding.  He has seen hematology and he has another follow-up soon to discuss possibly hemoglobin electrophoresis, it sounds like they believe he might have thalassemia.  He has been compliant with all medications.  His blood pressure is again noted to be elevated in office on 2 consecutive measurements.  He believes he has an element of whitecoat syndrome but does not have ambulatory measurements with him today.  He states he had his diabetic eye exam last month, we will obtain records.   Past Medical/Surgical History: Past Medical History:  Diagnosis Date   Anxiety    Arthritis    DDD  BACK   Depression    Diabetes mellitus    GERD (gastroesophageal reflux disease)    Headache    HX MIGRAINES    Hyperlipidemia    Hypertension    Sinusitis     Past Surgical History:  Procedure Laterality Date   CARDIAC CATHETERIZATION     2006    OKAY    epidural injections   2002   LUMBAR LAMINECTOMY/DECOMPRESSION MICRODISCECTOMY N/A 10/13/2015   Procedure: L3-4 L4-5 L5-S1 Laminectomy;  Surgeon: Barnett Abu, MD;  Location: [facility];  Service: Neurosurgery;  Laterality: N/A;  L3-4 L4-5 L5-S1 Laminectomy    Social History:  reports that he has never smoked. He has never used smokeless tobacco. He reports that he does not currently use alcohol. He reports that he does not use drugs.  Allergies: Allergies  Allergen Reactions   Demerol Itching    Family History:  Family History  Problem Relation Age of Onset   Heart disease Mother        has pacemaker   Colon  cancer Father 81   Diabetes Brother      Current Outpatient Medications:    AMBULATORY NON FORMULARY MEDICATION, 2 each daily. Medication Name: Super Beets Heart chews, Disp: , Rfl:    amLODipine (NORVASC) 10 MG tablet, Take 1 tablet (10 mg total) by mouth daily., Disp: 90 tablet, Rfl: 0   atorvastatin (LIPITOR) 80 MG tablet, Take 1 tablet (80 mg total) by mouth daily., Disp: 90 tablet, Rfl: 0   buPROPion (WELLBUTRIN XL) 300 MG 24 hr tablet, TAKE 1 TABLET (300 MG TOTAL) BY MOUTH DAILY., Disp: 90 tablet, Rfl: 0   ferrous sulfate 325 (65 FE) MG tablet, Take 325 mg by mouth 2 (two) times daily with a meal., Disp: , Rfl:    glucose blood (FREESTYLE LITE) test strip, 1 each by Other route daily. Dx E11.9, Disp: 100 each, Rfl: 3   losartan-hydrochlorothiazide (HYZAAR) 100-25 MG tablet, TAKE 1 TABLET BY MOUTH ONCE DAILY., Disp: 90 tablet, Rfl: 1   metFORMIN (GLUCOPHAGE) 500 MG tablet, Take 1 tablet (500 mg total) by mouth 2 (two) times daily with a meal., Disp: 180 tablet, Rfl: 0   metoprolol tartrate (LOPRESSOR) 25 MG tablet, Take 0.5 tablets (12.5 mg total) by mouth 2 (two) times daily., Disp: 180 tablet, Rfl: 1   omeprazole (PRILOSEC) 40 MG capsule, TAKE 1 CAPSULE (40 MG TOTAL) BY MOUTH DAILY., Disp: 90  capsule, Rfl: 1   Semaglutide, 1 MG/DOSE, (OZEMPIC, 1 MG/DOSE,) 4 MG/3ML SOPN, Inject 1 mg as directed once a week., Disp: 9 mL, Rfl: 0   cyclobenzaprine (FLEXERIL) 10 MG tablet, Take 1 tablet (10 mg total) by mouth 3 (three) times daily as needed for muscle spasms., Disp: 30 tablet, Rfl: 0   glipiZIDE (GLUCOTROL XL) 5 MG 24 hr tablet, Take 1 tablet (5 mg total) by mouth daily with breakfast., Disp: 90 tablet, Rfl: 0   ondansetron (ZOFRAN) 4 MG tablet, Take 1 tablet (4 mg total) by mouth every 8 (eight) hours as needed for nausea or vomiting., Disp: 20 tablet, Rfl: 0  Review of Systems:  Constitutional: Denies fever, chills, diaphoresis, appetite change and fatigue.  HEENT: Denies photophobia, eye  pain, redness, hearing loss, ear pain, congestion, sore throat, rhinorrhea, sneezing, mouth sores, trouble swallowing, neck pain, neck stiffness and tinnitus.   Respiratory: Denies SOB, DOE, cough, chest tightness,  and wheezing.   Cardiovascular: Denies chest pain, palpitations and leg swelling.  Gastrointestinal: Denies nausea, vomiting, abdominal pain, diarrhea, constipation, blood in stool and abdominal distention.  Genitourinary: Denies dysuria, urgency, frequency, hematuria, flank pain and difficulty urinating.  Endocrine: Denies: hot or cold intolerance, sweats, changes in hair or nails, polyuria, polydipsia. Musculoskeletal: Denies myalgias, back pain, joint swelling, arthralgias and gait problem.  Skin: Denies pallor, rash and wound.  Neurological: Denies dizziness, seizures, syncope, weakness, light-headedness, numbness and headaches.  Hematological: Denies adenopathy. Easy bruising, personal or family bleeding history  Psychiatric/Behavioral: Denies suicidal ideation, mood changes, confusion, nervousness, sleep disturbance and agitation    Physical Exam: Vitals:   11/28/21 0759 11/28/21 0823  BP: (!) 142/90 140/90  Pulse: 69   Temp: 98.2 F (36.8 C)   TempSrc: Oral   SpO2: 98%   Weight: 215 lb 8 oz (97.8 kg)     Body mass index is 30.06 kg/m.   Constitutional: NAD, calm, comfortable Eyes: PERRL, lids and conjunctivae normal ENMT: Mucous membranes are moist.  Respiratory: clear to auscultation bilaterally, no wheezing, no crackles. Normal respiratory effort. No accessory muscle use.  Cardiovascular: Regular rate and rhythm, no murmurs / rubs / gallops. No extremity edema. Neurologic: Intact and nonfocal. Psychiatric: Normal judgment and insight. Alert and oriented x 3. Normal mood.    Impression and Plan:  Type 2 diabetes mellitus with other specified complication, without long-term current use of insulin (HCC)  - Plan: POCT glycosylated hemoglobin (Hb A1C), CBC  with Differential/Platelet, Comprehensive metabolic panel, Microalbumin / creatinine urine ratio -A1c demonstrates good control at 6.8.  Primary hypertension -Blood pressure remains elevated above goal, he will return in 3 months with ambulatory measurements to see if medication adjustments need to be made.  Dyslipidemia  - Plan: Lipid panel    Time spent:33 minutes reviewing chart, interviewing and examining patient and formulating plan of care.   Patient Instructions  -Nice seeing you today!!  -Lab work today; will notify you once results are available.  -Schedule follow up in 3 months.    Chaya Jan, MD [facility]

## 2021-11-28 NOTE — Patient Instructions (Signed)
-  Nice seeing you today!!  -Lab work today; will notify you once results are available.  -Schedule follow up in 3 months. 

## 2021-12-14 ENCOUNTER — Other Ambulatory Visit: Payer: 59

## 2021-12-14 ENCOUNTER — Ambulatory Visit: Payer: 59 | Admitting: Oncology

## 2021-12-19 ENCOUNTER — Other Ambulatory Visit: Payer: Self-pay | Admitting: Internal Medicine

## 2021-12-19 ENCOUNTER — Other Ambulatory Visit (HOSPITAL_BASED_OUTPATIENT_CLINIC_OR_DEPARTMENT_OTHER): Payer: Self-pay

## 2021-12-19 DIAGNOSIS — E119 Type 2 diabetes mellitus without complications: Secondary | ICD-10-CM

## 2021-12-19 MED ORDER — OZEMPIC (1 MG/DOSE) 4 MG/3ML ~~LOC~~ SOPN
1.0000 mg | PEN_INJECTOR | SUBCUTANEOUS | 0 refills | Status: DC
  Filled 2021-12-26: qty 9, 84d supply, fill #0

## 2021-12-20 ENCOUNTER — Telehealth: Payer: 59 | Admitting: Family Medicine

## 2021-12-20 ENCOUNTER — Other Ambulatory Visit (HOSPITAL_BASED_OUTPATIENT_CLINIC_OR_DEPARTMENT_OTHER): Payer: Self-pay

## 2021-12-20 DIAGNOSIS — M5442 Lumbago with sciatica, left side: Secondary | ICD-10-CM | POA: Diagnosis not present

## 2021-12-20 MED ORDER — CYCLOBENZAPRINE HCL 10 MG PO TABS
10.0000 mg | ORAL_TABLET | Freq: Three times a day (TID) | ORAL | 0 refills | Status: DC | PRN
  Filled 2021-12-20: qty 30, 10d supply, fill #0

## 2021-12-20 MED ORDER — NAPROXEN 500 MG PO TABS
500.0000 mg | ORAL_TABLET | Freq: Two times a day (BID) | ORAL | 0 refills | Status: DC
  Filled 2021-12-20: qty 30, 15d supply, fill #0

## 2021-12-20 NOTE — Progress Notes (Signed)

## 2021-12-22 ENCOUNTER — Other Ambulatory Visit: Payer: Self-pay | Admitting: Family Medicine

## 2021-12-22 DIAGNOSIS — E785 Hyperlipidemia, unspecified: Secondary | ICD-10-CM

## 2021-12-26 ENCOUNTER — Inpatient Hospital Stay: Payer: 59 | Attending: Oncology

## 2021-12-26 ENCOUNTER — Inpatient Hospital Stay: Payer: 59 | Admitting: Oncology

## 2021-12-26 ENCOUNTER — Telehealth: Payer: Self-pay

## 2021-12-26 ENCOUNTER — Other Ambulatory Visit (HOSPITAL_BASED_OUTPATIENT_CLINIC_OR_DEPARTMENT_OTHER): Payer: Self-pay

## 2021-12-26 VITALS — BP 134/88 | HR 74 | Temp 98.1°F | Resp 18 | Ht 71.0 in | Wt 216.8 lb

## 2021-12-26 DIAGNOSIS — D509 Iron deficiency anemia, unspecified: Secondary | ICD-10-CM

## 2021-12-26 LAB — BASIC METABOLIC PANEL - CANCER CENTER ONLY
Anion gap: 17 — ABNORMAL HIGH (ref 5–15)
BUN: 13 mg/dL (ref 6–20)
CO2: 26 mmol/L (ref 22–32)
Calcium: 10.5 mg/dL — ABNORMAL HIGH (ref 8.9–10.3)
Chloride: 100 mmol/L (ref 98–111)
Creatinine: 1.05 mg/dL (ref 0.61–1.24)
GFR, Estimated: >60 mL/min (ref >60)
Glucose, Bld: 144 mg/dL — ABNORMAL HIGH (ref 70–99)
Potassium: 4 mmol/L (ref 3.5–5.1)
Sodium: 143 mmol/L (ref 135–145)

## 2021-12-26 LAB — CBC WITH DIFFERENTIAL (CANCER CENTER ONLY)
Abs Immature Granulocytes: 0 10*3/uL (ref 0.00–0.07)
Basophils Absolute: 0 10*3/uL (ref 0.0–0.1)
Basophils Relative: 0 %
Eosinophils Absolute: 0.1 10*3/uL (ref 0.0–0.5)
Eosinophils Relative: 1 %
HCT: 41.6 % (ref 39.0–52.0)
Hemoglobin: 13 g/dL (ref 13.0–17.0)
Immature Granulocytes: 0 %
Lymphocytes Relative: 45 %
Lymphs Abs: 2 10*3/uL (ref 0.7–4.0)
MCH: 23.8 pg — ABNORMAL LOW (ref 26.0–34.0)
MCHC: 31.3 g/dL (ref 30.0–36.0)
MCV: 76.1 fL — ABNORMAL LOW (ref 80.0–100.0)
Monocytes Absolute: 0.2 10*3/uL (ref 0.1–1.0)
Monocytes Relative: 5 %
Neutro Abs: 2.2 10*3/uL (ref 1.7–7.7)
Neutrophils Relative %: 49 %
Platelet Count: 303 10*3/uL (ref 150–400)
RBC: 5.47 MIL/uL (ref 4.22–5.81)
RDW: 13.6 % (ref 11.5–15.5)
WBC Count: 4.5 10*3/uL (ref 4.0–10.5)
nRBC: 0 % (ref 0.0–0.2)

## 2021-12-26 LAB — FERRITIN: Ferritin: 24 ng/mL (ref 24–336)

## 2021-12-26 NOTE — Telephone Encounter (Signed)
Per Dr Benay Spice B met added to labs today.

## 2021-12-26 NOTE — Progress Notes (Signed)
  [facility] OFFICE PROGRESS NOTE   Diagnosis: Microcytic anemia  INTERVAL HISTORY:   Mr Reginald Kirby returns as scheduled.  He continues iron twice daily.  He has constipation when taking iron, relieved with a stool softener.  He reports an improved energy level.  No new complaint.    Objective:  Vital signs in last 24 hours:  Blood pressure 134/88, pulse 74, temperature 98.1 F (36.7 C), temperature source Oral, resp. rate 18, height 5\' 11"  (1.803 m), weight 216 lb 12.8 oz (98.3 kg), SpO2 100 %.    Resp: Lungs clear bilaterally Cardio: Regular rate and rhythm GI: No hepatosplenomegaly Vascular: No leg edema   Lab Results:  Lab Results  Component Value Date   WBC 4.5 12/26/2021   HGB 13.0 12/26/2021   HCT 41.6 12/26/2021   MCV 76.1 (L) 12/26/2021   PLT 303 12/26/2021   NEUTROABS 2.2 12/26/2021    CMP  Lab Results  Component Value Date   NA 140 11/28/2021   K 4.0 11/28/2021   CL 98 11/28/2021   CO2 31 11/28/2021   GLUCOSE 150 (H) 11/28/2021   BUN 12 11/28/2021   CREATININE 1.17 11/28/2021   CALCIUM 10.8 (H) 11/28/2021   PROT 7.6 11/28/2021   ALBUMIN 4.6 11/28/2021   AST 18 11/28/2021   ALT 23 11/28/2021   ALKPHOS 73 11/28/2021   BILITOT 0.4 11/28/2021   GFRNONAA >60 10/05/2015   GFRAA >60 10/05/2015     Medications: I have reviewed the patient's current medications.   Assessment/Plan: Red cell microcytosis, dating to at least 2013 Iron deficiency Colonoscopy and upper endoscopy 07/02/2021-colonoscopy normal except for diverticulosis and hemorrhoids; upper endoscopy unremarkable, biopsies of the ileum normal, no evidence for celiac disease Stool cards negative 07/11/2021 Capsule endoscopy attempted but unsuccessful CT abdomen/pelvis 08/20/2021-no radiographic evidence of inflammatory bowel disease or other acute findings.  Mild sigmoid diverticulosis. Diabetes Hypertension Hyperlipidemia Depression    Disposition: Mr. 10/20/2021 was referred  for evaluation of microcytic anemia.  He has a history of iron deficiency anemia.  The MCV is higher and the ferritin level is at the low end of the normal range.  I suspect the iron deficiency is still correcting.  He will continue iron.  He will return for an office visit, CBC, and ferritin level in 2-3 months.  We will obtain a hemoglobin electrophoresis and alpha thalassemia evaluation if the Red cell microcytosis persist.  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  Reginald Skye, MD  12/26/2021  11:51 AM

## 2021-12-27 ENCOUNTER — Other Ambulatory Visit: Payer: Self-pay | Admitting: Nurse Practitioner

## 2021-12-27 ENCOUNTER — Other Ambulatory Visit: Payer: Self-pay | Admitting: Internal Medicine

## 2021-12-27 DIAGNOSIS — D509 Iron deficiency anemia, unspecified: Secondary | ICD-10-CM

## 2021-12-27 DIAGNOSIS — K219 Gastro-esophageal reflux disease without esophagitis: Secondary | ICD-10-CM

## 2021-12-27 DIAGNOSIS — I10 Essential (primary) hypertension: Secondary | ICD-10-CM

## 2022-02-05 ENCOUNTER — Other Ambulatory Visit: Payer: Self-pay | Admitting: Internal Medicine

## 2022-02-05 DIAGNOSIS — I10 Essential (primary) hypertension: Secondary | ICD-10-CM

## 2022-02-05 DIAGNOSIS — G4700 Insomnia, unspecified: Secondary | ICD-10-CM

## 2022-02-05 DIAGNOSIS — E119 Type 2 diabetes mellitus without complications: Secondary | ICD-10-CM

## 2022-03-06 ENCOUNTER — Other Ambulatory Visit: Payer: Self-pay | Admitting: Internal Medicine

## 2022-03-06 DIAGNOSIS — E119 Type 2 diabetes mellitus without complications: Secondary | ICD-10-CM

## 2022-03-07 ENCOUNTER — Ambulatory Visit: Payer: 59 | Admitting: Internal Medicine

## 2022-03-07 ENCOUNTER — Inpatient Hospital Stay: Payer: 59 | Admitting: Nurse Practitioner

## 2022-03-07 ENCOUNTER — Encounter (HOSPITAL_BASED_OUTPATIENT_CLINIC_OR_DEPARTMENT_OTHER): Payer: Self-pay | Admitting: Pharmacist

## 2022-03-07 ENCOUNTER — Other Ambulatory Visit (HOSPITAL_BASED_OUTPATIENT_CLINIC_OR_DEPARTMENT_OTHER): Payer: Self-pay

## 2022-03-07 ENCOUNTER — Inpatient Hospital Stay: Payer: 59

## 2022-03-07 MED ORDER — OZEMPIC (1 MG/DOSE) 4 MG/3ML ~~LOC~~ SOPN
1.0000 mg | PEN_INJECTOR | SUBCUTANEOUS | 0 refills | Status: DC
  Filled 2022-03-07 – 2022-03-11 (×2): qty 9, 84d supply, fill #0

## 2022-03-11 ENCOUNTER — Other Ambulatory Visit (HOSPITAL_BASED_OUTPATIENT_CLINIC_OR_DEPARTMENT_OTHER): Payer: Self-pay

## 2022-03-25 ENCOUNTER — Encounter: Payer: Self-pay | Admitting: Internal Medicine

## 2022-03-25 ENCOUNTER — Ambulatory Visit: Payer: 59 | Admitting: Internal Medicine

## 2022-03-25 VITALS — BP 118/70 | HR 72 | Temp 98.1°F | Wt 212.3 lb

## 2022-03-25 DIAGNOSIS — Z23 Encounter for immunization: Secondary | ICD-10-CM | POA: Diagnosis not present

## 2022-03-25 DIAGNOSIS — E785 Hyperlipidemia, unspecified: Secondary | ICD-10-CM | POA: Diagnosis not present

## 2022-03-25 DIAGNOSIS — E1169 Type 2 diabetes mellitus with other specified complication: Secondary | ICD-10-CM | POA: Diagnosis not present

## 2022-03-25 DIAGNOSIS — I10 Essential (primary) hypertension: Secondary | ICD-10-CM | POA: Diagnosis not present

## 2022-03-25 DIAGNOSIS — D509 Iron deficiency anemia, unspecified: Secondary | ICD-10-CM | POA: Diagnosis not present

## 2022-03-25 DIAGNOSIS — M542 Cervicalgia: Secondary | ICD-10-CM

## 2022-03-25 LAB — POCT GLYCOSYLATED HEMOGLOBIN (HGB A1C): Hemoglobin A1C: 6.2 % — AB (ref 4.0–5.6)

## 2022-03-25 NOTE — Progress Notes (Signed)
Established Patient Office Visit     CC/Reason for Visit: 40-month follow-up chronic medical conditions  HPI: Reginald Angert. is a 46 y.o. male who is coming in today for the above mentioned reasons. Past Medical History is significant for: Hypertension, hyperlipidemia, type 2 diabetes and depression.  He is doing well.  He recently started a job with heart care.  He has been spending more time in the computer.  He has been having right-sided neck and upper shoulder pain.  He has tried a TENS unit, massage with Biofreeze, Tylenol without much relief.  He is adherent to medical therapy.  Requesting a flu vaccine today.   Past Medical/Surgical History: Past Medical History:  Diagnosis Date   Anxiety    Arthritis    DDD  BACK   Depression    Diabetes mellitus    GERD (gastroesophageal reflux disease)    Headache    HX MIGRAINES    Hyperlipidemia    Hypertension    Sinusitis     Past Surgical History:  Procedure Laterality Date   CARDIAC CATHETERIZATION     2006    OKAY    epidural injections   2002   LUMBAR LAMINECTOMY/DECOMPRESSION MICRODISCECTOMY N/A 10/13/2015   Procedure: L3-4 L4-5 L5-S1 Laminectomy;  Surgeon: Kristeen Miss, MD;  Location: [facility];  Service: Neurosurgery;  Laterality: N/A;  L3-4 L4-5 L5-S1 Laminectomy    Social History:  reports that he has never smoked. He has never used smokeless tobacco. He reports that he does not currently use alcohol. He reports that he does not use drugs.  Allergies: Allergies  Allergen Reactions   Demerol Itching    Family History:  Family History  Problem Relation Age of Onset   Heart disease Mother        has pacemaker   Colon cancer Father 52   Diabetes Brother      Current Outpatient Medications:    amLODipine (NORVASC) 10 MG tablet, Take 1 tablet by mouth daily., Disp: 90 tablet, Rfl: 1   atorvastatin (LIPITOR) 80 MG tablet, Take 1 tablet by mouth daily., Disp: 90 tablet, Rfl: 1   buPROPion  (WELLBUTRIN XL) 300 MG 24 hr tablet, Take 1 tablet by mouth daily., Disp: 90 tablet, Rfl: 1   ferrous sulfate 325 (65 FE) MG tablet, Take 325 mg by mouth 2 (two) times daily with a meal., Disp: , Rfl:    glucose blood (FREESTYLE LITE) test strip, 1 each by Other route daily. Dx E11.9, Disp: 100 each, Rfl: 3   losartan-hydrochlorothiazide (HYZAAR) 100-25 MG tablet, Take 1 tablet by mouth daily., Disp: 90 tablet, Rfl: 1   metFORMIN (GLUCOPHAGE) 500 MG tablet, Take 1 tablet by mouth twice daily with meals., Disp: 180 tablet, Rfl: 1   metoprolol tartrate (LOPRESSOR) 25 MG tablet, Take 1/2 tablet by mouth twice daily., Disp: 180 tablet, Rfl: 1   omeprazole (PRILOSEC) 40 MG capsule, Take 1 capsule by mouth daily., Disp: 90 capsule, Rfl: 1   Semaglutide, 1 MG/DOSE, (OZEMPIC, 1 MG/DOSE,) 4 MG/3ML SOPN, Inject 1 mg as directed once a week., Disp: 9 mL, Rfl: 0  Review of Systems:  Constitutional: Denies fever, chills, diaphoresis, appetite change and fatigue.  HEENT: Denies photophobia, eye pain, redness, hearing loss, ear pain, congestion, sore throat, rhinorrhea, sneezing, mouth sores, trouble swallowing, neck pain, neck stiffness and tinnitus.   Respiratory: Denies SOB, DOE, cough, chest tightness,  and wheezing.   Cardiovascular: Denies chest pain, palpitations and leg  swelling.  Gastrointestinal: Denies nausea, vomiting, abdominal pain, diarrhea, constipation, blood in stool and abdominal distention.  Genitourinary: Denies dysuria, urgency, frequency, hematuria, flank pain and difficulty urinating.  Endocrine: Denies: hot or cold intolerance, sweats, changes in hair or nails, polyuria, polydipsia. Musculoskeletal: Denies  joint swelling, arthralgias and gait problem.  Skin: Denies pallor, rash and wound.  Neurological: Denies dizziness, seizures, syncope, weakness, light-headedness, numbness and headaches.  Hematological: Denies adenopathy. Easy bruising, personal or family bleeding history   Psychiatric/Behavioral: Denies suicidal ideation, mood changes, confusion, nervousness, sleep disturbance and agitation    Physical Exam: Vitals:   03/25/22 0711 03/25/22 0732  BP: (!) 140/90 118/70  Pulse: 72   Temp: 98.1 F (36.7 C)   TempSrc: Oral   SpO2: 99%   Weight: 212 lb 4.8 oz (96.3 kg)     Body mass index is 29.61 kg/m.   Constitutional: NAD, calm, comfortable Eyes: PERRL, lids and conjunctivae normal ENMT: Mucous membranes are moist.  Respiratory: clear to auscultation bilaterally, no wheezing, no crackles. Normal respiratory effort. No accessory muscle use.  Cardiovascular: Regular rate and rhythm, no murmurs / rubs / gallops. No extremity edema. Psychiatric: Normal judgment and insight. Alert and oriented x 3. Normal mood.    Impression and Plan:  Type 2 diabetes mellitus with other specified complication, without long-term current use of insulin (HCC) - Plan: POC HgB A1c  Influenza vaccine needed - Plan: Flu Vaccine QUAD 6+ mos PF IM (Fluarix Quad PF)  Dyslipidemia  Primary hypertension  Iron deficiency anemia, unspecified iron deficiency anemia type  Neck pain on right side - Plan: Ambulatory referral to Sports Medicine  -Flu vaccine administered in office today. -A1c shows excellent diabetic control at 6.2, continue semaglutide 1 mg weekly and metformin. -Blood pressure remains elevated in office, however he reports that his home measurements are usually around 1 15-1 20 systolic and 70-80 diastolic.  He has been asked to bring in ambulatory measurements and home blood pressure cuff to next visit. -LDL is at goal at 33 as of June 2023, he is on atorvastatin 80 mg daily. -He has right-sided neck and shoulder pain that began at around the same time that he has been doing increased computer work with his new job.  Suspect muscular.  Referred to sports medicine per request.  Time spent:31 minutes reviewing chart, interviewing and examining patient and  formulating plan of care.       Chaya Jan, MD [facility]

## 2022-04-01 NOTE — Progress Notes (Deleted)
   I, Peterson Lombard, LAT, ATC acting as a scribe for Lynne Leader, MD.  Subjective:    CC: Neck pain  HPI: Patient is a 46 year old male complaining of neck pain x?  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  Patient locates pain to  Radiates:  UE numbness/tingling: UE weakness: Aggravates: Treatments tried: TENS unit, Biofreeze, Tylenol, massage  Pertinent review of Systems: ***  Relevant historical information: ***   Objective:   There were no vitals filed for this visit. General: Well Developed, well nourished, and in no acute distress.   MSK: ***  Lab and Radiology Results No results found for this or any previous visit (from the past 72 hour(s)). No results found.    Impression and Recommendations:    Assessment and Plan: 46 y.o. male with ***.  PDMP not reviewed this encounter. No orders of the defined types were placed in this encounter.  No orders of the defined types were placed in this encounter.   Discussed warning signs or symptoms. Please see discharge instructions. Patient expresses understanding.   ***

## 2022-04-02 ENCOUNTER — Ambulatory Visit: Payer: 59 | Admitting: Family Medicine

## 2022-04-02 DIAGNOSIS — M542 Cervicalgia: Secondary | ICD-10-CM

## 2022-04-14 ENCOUNTER — Telehealth: Payer: 59 | Admitting: Family

## 2022-04-14 DIAGNOSIS — U071 COVID-19: Secondary | ICD-10-CM | POA: Diagnosis not present

## 2022-04-14 MED ORDER — FLUTICASONE PROPIONATE 50 MCG/ACT NA SUSP: 2.0000 | Freq: Every day | NASAL | 6 refills | Status: DC

## 2022-04-14 MED ORDER — BENZONATATE 100 MG PO CAPS: 100.0000 mg | ORAL_CAPSULE | Freq: Three times a day (TID) | ORAL | 0 refills | Status: DC | PRN

## 2022-04-14 NOTE — Progress Notes (Signed)
  E-Visit  for Positive Covid Test Result  We are sorry you are not feeling well. We are here to help!  You have tested positive for COVID-19, meaning that you were infected with the novel coronavirus and could give the virus to others.  It is vitally important that you stay home so you do not spread it to others.      Please continue isolation at home, for at least 10 days since the start of your symptoms and until you have had 24 hours with no fever (without taking a fever reducer) and with improving of symptoms.  If you have no symptoms but tested positive (or all symptoms resolve after 5 days and you have no fever) you can leave your house but continue to wear a mask around others for an additional 5 days. If you have a fever,continue to stay home until you have had 24 hours of no fever. Most cases improve 5-10 days from onset but we have seen a small number of patients who have gotten worse after the 10 days.  Please be sure to watch for worsening symptoms and remain taking the proper precautions.   Go to the nearest hospital ED for assessment if fever/cough/breathlessness are severe or illness seems like a threat to life.    The following symptoms may appear 2-14 days after exposure: Fever Cough Shortness of breath or difficulty breathing Chills Repeated shaking with chills Muscle pain Headache Sore throat New loss of taste or smell Fatigue Congestion or runny nose Nausea or vomiting Diarrhea  You have been enrolled in MyChart Home Monitoring for COVID-19. Daily you will receive a questionnaire within the MyChart website. Our COVID-19 response team will be monitoring your responses daily.  You can use medication such as prescription cough medication called Tessalon Perles 100 mg. You may take 1-2 capsules every 8 hours as needed for cough and prescription for Fluticasone nasal spray 2 sprays in each nostril one time per day  You may also take acetaminophen (Tylenol) as needed  for fever.  HOME CARE: Only take medications as instructed by your medical team. Drink plenty of fluids and get plenty of rest. A steam or ultrasonic humidifier can help if you have congestion.   GET HELP RIGHT AWAY IF YOU HAVE EMERGENCY WARNING SIGNS.  Call 911 or proceed to your closest emergency facility if: You develop worsening high fever. Trouble breathing Bluish lips or face Persistent pain or pressure in the chest New confusion Inability to wake or stay awake You cough up blood. Your symptoms become more severe Inability to hold down food or fluids  This list is not all possible symptoms. Contact your medical provider for any symptoms that are severe or concerning to you.    Your e-visit answers were reviewed by a board certified advanced clinical practitioner to complete your personal care plan.  Depending on the condition, your plan could have included both over the counter or prescription medications.  If there is a problem please reply once you have received a response from your provider.  Your safety is important to us.  If you have drug allergies check your prescription carefully.    You can use MyChart to ask questions about today's visit, request a non-urgent call back, or ask for a work or school excuse for 24 hours related to this e-Visit. If it has been greater than 24 hours you will need to follow up with your provider, or enter a new e-Visit to address those   concerns. You will get an e-mail in the next two days asking about your experience.  I hope that your e-visit has been valuable and will speed your recovery. Thank you for using e-visits.   Approximately 5 minutes was spent documenting and reviewing patient's chart.     

## 2022-04-16 ENCOUNTER — Telehealth: Payer: 59 | Admitting: Family Medicine

## 2022-04-16 ENCOUNTER — Telehealth: Payer: 59 | Admitting: Physician Assistant

## 2022-04-16 ENCOUNTER — Other Ambulatory Visit (HOSPITAL_BASED_OUTPATIENT_CLINIC_OR_DEPARTMENT_OTHER): Payer: Self-pay

## 2022-04-16 DIAGNOSIS — U071 COVID-19: Secondary | ICD-10-CM | POA: Diagnosis not present

## 2022-04-16 MED ORDER — ALBUTEROL SULFATE HFA 108 (90 BASE) MCG/ACT IN AERS
2.0000 | INHALATION_SPRAY | Freq: Four times a day (QID) | RESPIRATORY_TRACT | 0 refills | Status: DC | PRN
  Filled 2022-04-16: qty 6.7, 17d supply, fill #0

## 2022-04-16 MED ORDER — NIRMATRELVIR/RITONAVIR (PAXLOVID)TABLET
3.0000 | ORAL_TABLET | Freq: Two times a day (BID) | ORAL | 0 refills | Status: AC
  Filled 2022-04-16: qty 30, 5d supply, fill #0

## 2022-04-16 NOTE — Progress Notes (Signed)
Virtual Visit Consent   Reginald Lund., you are scheduled for a virtual visit with a [facility] provider today. Just as with appointments in the office, your consent must be obtained to participate. Your consent will be active for this visit and any virtual visit you may have with one of our providers in the next 365 days. If you have a MyChart account, a copy of this consent can be sent to you electronically.  As this is a virtual visit, video technology does not allow for your provider to perform a traditional examination. This may limit your provider's ability to fully assess your condition. If your provider identifies any concerns that need to be evaluated in person or the need to arrange testing (such as labs, EKG, etc.), we will make arrangements to do so. Although advances in technology are sophisticated, we cannot ensure that it will always work on either your end or our end. If the connection with a video visit is poor, the visit may have to be switched to a telephone visit. With either a video or telephone visit, we are not always able to ensure that we have a secure connection.  By engaging in this virtual visit, you consent to the provision of healthcare and authorize for your insurance to be billed (if applicable) for the services provided during this visit. Depending on your insurance coverage, you may receive a charge related to this service.  I need to obtain your verbal consent now. Are you willing to proceed with your visit today? Reginald Lund. has provided verbal consent on 04/16/2022 for a virtual visit (video or telephone). Leeanne Rio, [place]  Date: 04/16/2022 9:35 AM  Virtual Visit via Video Note   I, Leeanne Rio, connected with  Reginald Lund.  ([id number], Mar 17, 1976) on 04/16/22 at  9:30 AM EDT by a video-enabled telemedicine application and verified that I am speaking with the correct person using two identifiers.  Location: Patient: Virtual Visit  Location Patient: Home Provider: Virtual Visit Location Provider: Home Office    I discussed the limitations of evaluation and management by telemedicine and the availability of in person appointments. The patient expressed understanding and agreed to proceed.    History of Present Illness: Reginald Gracey. is a 46 y.o. who identifies as a male who was assigned male at birth, and is being seen today for COVID-19. Symptoms starting 10/27 with positive test on 10/29. Submitted e-visit on 10/29 and was given supportive measures to follow as well as Rx for Flonase and Tessalon. Is still having low-grade fever, aches, chest congestion and productive cough. Some occasional chest tightness. Denies overt CP. Denies GI symptoms. Is a diabetic and hypertensive patient.    HPI: HPI  Problems:  Patient Active Problem List   Diagnosis Date Noted   Iron deficiency anemia 05/16/2021   Gastroesophageal reflux disease without esophagitis 05/16/2021   Gout 09/15/2018   Insomnia 05/13/2018   Lumbar radiculopathy 10/13/2015   Family history of colon cancer 07/06/2015   Diabetes mellitus without complication ([place]) 93/23/5573   Lumbar pain 01/21/2015   Dyslipidemia 10/23/2011   Hypertension 10/23/2011    Allergies:  Allergies  Allergen Reactions   Demerol Itching   Medications:  Current Outpatient Medications:    albuterol (VENTOLIN HFA) 108 (90 Base) MCG/ACT inhaler, Inhale 2 puffs into the lungs every 6 (six) hours as needed for wheezing or shortness of breath., Disp: 6.7 g, Rfl: 0   nirmatrelvir/ritonavir EUA (PAXLOVID)  20 x 150 MG & 10 x 100MG  TABS, Take 3 tablets by mouth 2 (two) times daily for 5 days., Disp: 30 tablet, Rfl: 0   amLODipine (NORVASC) 10 MG tablet, Take 1 tablet by mouth daily., Disp: 90 tablet, Rfl: 1   atorvastatin (LIPITOR) 80 MG tablet, Take 1 tablet by mouth daily., Disp: 90 tablet, Rfl: 1   benzonatate (TESSALON PERLES) 100 MG capsule, Take 1 capsule (100 mg total) by mouth  3 (three) times daily as needed., Disp: 20 capsule, Rfl: 0   buPROPion (WELLBUTRIN XL) 300 MG 24 hr tablet, Take 1 tablet by mouth daily., Disp: 90 tablet, Rfl: 1   ferrous sulfate 325 (65 FE) MG tablet, Take 325 mg by mouth 2 (two) times daily with a meal., Disp: , Rfl:    glucose blood (FREESTYLE LITE) test strip, 1 each by Other route daily. Dx E11.9, Disp: 100 each, Rfl: 3   losartan-hydrochlorothiazide (HYZAAR) 100-25 MG tablet, Take 1 tablet by mouth daily., Disp: 90 tablet, Rfl: 1   metFORMIN (GLUCOPHAGE) 500 MG tablet, Take 1 tablet by mouth twice daily with meals., Disp: 180 tablet, Rfl: 1   metoprolol tartrate (LOPRESSOR) 25 MG tablet, Take 1/2 tablet by mouth twice daily., Disp: 180 tablet, Rfl: 1   omeprazole (PRILOSEC) 40 MG capsule, Take 1 capsule by mouth daily., Disp: 90 capsule, Rfl: 1   Semaglutide, 1 MG/DOSE, (OZEMPIC, 1 MG/DOSE,) 4 MG/3ML SOPN, Inject 1 mg as directed once a week., Disp: 9 mL, Rfl: 0  Observations/Objective: Patient is well-developed, well-nourished in no acute distress.  Resting comfortably at home.  Head is normocephalic, atraumatic.  No labored breathing. Speech is clear and coherent with logical content.  Patient is alert and oriented at baseline.   Assessment and Plan: 1. COVID-19 - MyChart COVID-19 home monitoring program; Future - albuterol (VENTOLIN HFA) 108 (90 Base) MCG/ACT inhaler; Inhale 2 puffs into the lungs every 6 (six) hours as needed for wheezing or shortness of breath.  Dispense: 6.7 g; Refill: 0 - nirmatrelvir/ritonavir EUA (PAXLOVID) 20 x 150 MG & 10 x 100MG  TABS; Take 3 tablets by mouth 2 (two) times daily for 5 days.  Dispense: 30 tablet; Refill: 0  Patient with multiple risk factors for complicated course of illness. Discussed risks/benefits of antiviral medications including most common potential ADRs. Patient voiced understanding and would like to proceed with antiviral medication. They are candidate for Paxlovid. Rx sent to  pharmacy. Supportive measures, OTC medications and vitamin regimen reviewed. Ok to use as directed. Albuterol per orders. Patient has been enrolled in a MyChart COVID symptom monitoring program. reviewed in detail. Strict ER precautions discussed with patient.    Follow Up Instructions: I discussed the assessment and treatment plan with the patient. The patient was provided an opportunity to ask questions and all were answered. The patient agreed with the plan and demonstrated an understanding of the instructions.  A copy of instructions were sent to the patient via MyChart unless otherwise noted below.   The patient was advised to call back or seek an in-person evaluation if the symptoms worsen or if the condition fails to improve as anticipated.  Time:  I spent 10 minutes with the patient via telehealth technology discussing the above problems/concerns.    [employer], PA-C

## 2022-04-16 NOTE — Patient Instructions (Signed)
Reginald Kirby., thank you for joining Piedad Climes, PA-C for today's virtual visit.  While this provider is not your primary care provider (PCP), if your PCP is located in our provider database this encounter information will be shared with them immediately following your visit.   A [facility] MyChart account gives you access to today's visit and all your visits, tests, and labs performed at [facility] " click here if you don't have a [facility] MyChart account or go to [email or web address]  Consent: (Patient) Reginald Kirby. provided verbal consent for this virtual visit at the beginning of the encounter.  Current Medications:  Current Outpatient Medications:    amLODipine (NORVASC) 10 MG tablet, Take 1 tablet by mouth daily., Disp: 90 tablet, Rfl: 1   atorvastatin (LIPITOR) 80 MG tablet, Take 1 tablet by mouth daily., Disp: 90 tablet, Rfl: 1   benzonatate (TESSALON PERLES) 100 MG capsule, Take 1 capsule (100 mg total) by mouth 3 (three) times daily as needed., Disp: 20 capsule, Rfl: 0   buPROPion (WELLBUTRIN XL) 300 MG 24 hr tablet, Take 1 tablet by mouth daily., Disp: 90 tablet, Rfl: 1   ferrous sulfate 325 (65 FE) MG tablet, Take 325 mg by mouth 2 (two) times daily with a meal., Disp: , Rfl:    fluticasone (FLONASE) 50 MCG/ACT nasal spray, Place 2 sprays into both nostrils daily., Disp: 16 g, Rfl: 6   glucose blood (FREESTYLE LITE) test strip, 1 each by Other route daily. Dx E11.9, Disp: 100 each, Rfl: 3   losartan-hydrochlorothiazide (HYZAAR) 100-25 MG tablet, Take 1 tablet by mouth daily., Disp: 90 tablet, Rfl: 1   metFORMIN (GLUCOPHAGE) 500 MG tablet, Take 1 tablet by mouth twice daily with meals., Disp: 180 tablet, Rfl: 1   metoprolol tartrate (LOPRESSOR) 25 MG tablet, Take 1/2 tablet by mouth twice daily., Disp: 180 tablet, Rfl: 1   omeprazole (PRILOSEC) 40 MG capsule, Take 1 capsule by mouth daily., Disp: 90 capsule, Rfl: 1   Semaglutide, 1 MG/DOSE,  (OZEMPIC, 1 MG/DOSE,) 4 MG/3ML SOPN, Inject 1 mg as directed once a week., Disp: 9 mL, Rfl: 0   Medications ordered in this encounter:  No orders of the defined types were placed in this encounter.    *If you need refills on other medications prior to your next appointment, please contact your pharmacy*  Follow-Up: Call back or seek an in-person evaluation if the symptoms worsen or if the condition fails to improve as anticipated.  [facility] [phone]  Other Instructions Please keep well-hydrated and get plenty of rest. Start a saline nasal rinse to flush out your nasal passages. You can use plain Mucinex to help thin congestion. If you have a humidifier, running in the bedroom at night. I want you to start OTC vitamin D3 1000 units daily, vitamin C 1000 mg daily, and a zinc supplement. Please take prescribed medications as directed.  You have been enrolled in a MyChart symptom monitoring program. Please answer these questions daily so we can keep track of how you are doing.  You were to quarantine for 5 days from onset of your symptoms.  After day 5, if you have had no fever and you are feeling better, you can end quarantine but need to mask for an additional 5 days. After day 5 if you have a fever or are having significant symptoms, please quarantine for full 10 days.  If you note any worsening of symptoms, any significant shortness  of breath or any chest pain, please seek ER evaluation ASAP.  Please do not delay care!  COVID-19: What to Do if You Are Sick If you test positive and are an older adult or someone who is at high risk of getting very sick from COVID-19, treatment may be available. Contact a healthcare provider right away after a positive test to determine if you are eligible, even if your symptoms are mild right now. You can also visit a Test to Treat location and, if eligible, receive a prescription from a provider. Don't delay: Treatment must be  started within the first few days to be effective. If you have a fever, cough, or other symptoms, you might have COVID-19. Most people have mild illness and are able to recover at home. If you are sick: Keep track of your symptoms. If you have an emergency warning sign (including trouble breathing), call 911. Steps to help prevent the spread of COVID-19 if you are sick If you are sick with COVID-19 or think you might have COVID-19, follow the steps below to care for yourself and to help protect other people in your home and community. Stay home except to get medical care Stay home. Most people with COVID-19 have mild illness and can recover at home without medical care. Do not leave your home, except to get medical care. Do not visit public areas and do not go to places where you are unable to wear a mask. Take care of yourself. Get rest and stay hydrated. Take over-the-counter medicines, such as acetaminophen, to help you feel better. Stay in touch with your doctor. Call before you get medical care. Be sure to get care if you have trouble breathing, or have any other emergency warning signs, or if you think it is an emergency. Avoid public transportation, ride-sharing, or taxis if possible. Get tested If you have symptoms of COVID-19, get tested. While waiting for test results, stay away from others, including staying apart from those living in your household. Get tested as soon as possible after your symptoms start. Treatments may be available for people with COVID-19 who are at risk for becoming very sick. Don't delay: Treatment must be started early to be effective--some treatments must begin within 5 days of your first symptoms. Contact your healthcare provider right away if your test result is positive to determine if you are eligible. Self-tests are one of several options for testing for the virus that causes COVID-19 and may be more convenient than laboratory-based tests and point-of-care  tests. Ask your healthcare provider or your local health department if you need help interpreting your test results. You can visit your state, tribal, local, and territorial health department's website to look for the latest local information on testing sites. Separate yourself from other people As much as possible, stay in a specific room and away from other people and pets in your home. If possible, you should use a separate bathroom. If you need to be around other people or animals in or outside of the home, wear a well-fitting mask. Tell your close contacts that they may have been exposed to COVID-19. An infected person can spread COVID-19 starting 48 hours (or 2 days) before the person has any symptoms or tests positive. By letting your close contacts know they may have been exposed to COVID-19, you are helping to protect everyone. See COVID-19 and Animals if you have questions about pets. If you are diagnosed with COVID-19, someone from the health department may call  you. Answer the call to slow the spread. Monitor your symptoms Symptoms of COVID-19 include fever, cough, or other symptoms. Follow care instructions from your healthcare provider and local health department. Your local health authorities may give instructions on checking your symptoms and reporting information. When to seek emergency medical attention Look for emergency warning signs* for COVID-19. If someone is showing any of these signs, seek emergency medical care immediately: Trouble breathing Persistent pain or pressure in the chest New confusion Inability to wake or stay awake Pale, gray, or blue-colored skin, lips, or nail beds, depending on skin tone *This list is not all possible symptoms. Please call your medical provider for any other symptoms that are severe or concerning to you. Call 911 or call ahead to your local emergency facility: Notify the operator that you are seeking care for someone who has or may have  COVID-19. Call ahead before visiting your doctor Call ahead. Many medical visits for routine care are being postponed or done by phone or telemedicine. If you have a medical appointment that cannot be postponed, call your doctor's office, and tell them you have or may have COVID-19. This will help the office protect themselves and other patients. If you are sick, wear a well-fitting mask You should wear a mask if you must be around other people or animals, including pets (even at home). Wear a mask with the best fit, protection, and comfort for you. You don't need to wear the mask if you are alone. If you can't put on a mask (because of trouble breathing, for example), cover your coughs and sneezes in some other way. Try to stay at least 6 feet away from other people. This will help protect the people around you. Masks should not be placed on young children under age 61 years, anyone who has trouble breathing, or anyone who is not able to remove the mask without help. Cover your coughs and sneezes Cover your mouth and nose with a tissue when you cough or sneeze. Throw away used tissues in a lined trash can. Immediately wash your hands with soap and water for at least 20 seconds. If soap and water are not available, clean your hands with an alcohol-based hand sanitizer that contains at least 60% alcohol. Clean your hands often Wash your hands often with soap and water for at least 20 seconds. This is especially important after blowing your nose, coughing, or sneezing; going to the bathroom; and before eating or preparing food. Use hand sanitizer if soap and water are not available. Use an alcohol-based hand sanitizer with at least 60% alcohol, covering all surfaces of your hands and rubbing them together until they feel dry. Soap and water are the best option, especially if hands are visibly dirty. Avoid touching your eyes, nose, and mouth with unwashed hands. Handwashing Tips Avoid sharing personal  household items Do not share dishes, drinking glasses, cups, eating utensils, towels, or bedding with other people in your home. Wash these items thoroughly after using them with soap and water or put in the dishwasher. Clean surfaces in your home regularly Clean and disinfect high-touch surfaces (for example, doorknobs, tables, handles, light switches, and countertops) in your "sick room" and bathroom. In shared spaces, you should clean and disinfect surfaces and items after each use by the person who is ill. If you are sick and cannot clean, a caregiver or other person should only clean and disinfect the area around you (such as your bedroom and bathroom) on an  as needed basis. Your caregiver/other person should wait as long as possible (at least several hours) and wear a mask before entering, cleaning, and disinfecting shared spaces that you use. Clean and disinfect areas that may have blood, stool, or body fluids on them. Use household cleaners and disinfectants. Clean visible dirty surfaces with household cleaners containing soap or detergent. Then, use a household disinfectant. Use a product from [employer] List N: Disinfectants for Coronavirus (COVID-19). Be sure to follow the instructions on the label to ensure safe and effective use of the product. Many products recommend keeping the surface wet with a disinfectant for a certain period of time (look at "contact time" on the product label). You may also need to wear personal protective equipment, such as gloves, depending on the directions on the product label. Immediately after disinfecting, wash your hands with soap and water for 20 seconds. For completed guidance on cleaning and disinfecting your home, visit Complete Disinfection Guidance. Take steps to improve ventilation at home Improve ventilation (air flow) at home to help prevent from spreading COVID-19 to other people in your household. Clear out COVID-19 virus particles in the air by  opening windows, using air filters, and turning on fans in your home. Use this interactive tool to learn how to improve air flow in your home. When you can be around others after being sick with COVID-19 Deciding when you can be around others is different for different situations. Find out when you can safely end home isolation. For any additional questions about your care, contact your healthcare provider or state or local health department. 09/05/2020 Content source: [facility] for Immunization and Respiratory Diseases ([facility]), Division of Viral Diseases This information is not intended to replace advice given to you by your health care provider. Make sure you discuss any questions you have with your health care provider. Document Revised: 10/19/2020 Document Reviewed: 10/19/2020 Elsevier Patient Education  2022 [employer].      If you have been instructed to have an in-person evaluation today at a local Urgent Care facility, please use the link below. It will take you to a list of all of our available [facility], including address, phone number and hours of operation. Please do not delay care.  [facility]  If you or a family member do not have a primary care provider, use the link below to schedule a visit and establish care. When you choose a [facility] primary care physician or advanced practice provider, you gain a long-term partner in health. Find a Primary Care Provider  Learn more about [facility] in-office and virtual care options: [facility] - Get Care Now

## 2022-04-16 NOTE — Progress Notes (Signed)
/  Reginald Kirby   Recent EV on 04/14/2022, Viral Covid treatment- similar EV submitted, given COVID advised VV would be better to see if in person is needed or if we can provided treatment virtually or if we need to recommend in person - face to face.  Message sent to patient.

## 2022-04-18 ENCOUNTER — Telehealth: Payer: Self-pay

## 2022-04-18 NOTE — Telephone Encounter (Signed)
Called and spoke with patient in regards to Reginald Kirby about symptoms of weakness. Patient states that he felt more fatigued than weak, but is feeling better. Advised patient from my chart companion. The care advise given:  If patient has worsening weakness with inability to stand or if patient has to hold on to something to get balance, advise patient to call 911 and seek treatment in ED.

## 2022-04-20 ENCOUNTER — Telehealth: Payer: 59 | Admitting: Nurse Practitioner

## 2022-04-20 DIAGNOSIS — U099 Post covid-19 condition, unspecified: Secondary | ICD-10-CM

## 2022-04-20 DIAGNOSIS — R053 Chronic cough: Secondary | ICD-10-CM | POA: Diagnosis not present

## 2022-04-20 MED ORDER — AZITHROMYCIN 250 MG PO TABS: ORAL_TABLET | ORAL | 0 refills | Status: DC

## 2022-04-20 NOTE — Patient Instructions (Addendum)
Reginald Kirby., thank you for joining Chevis Pretty, FNP for today's virtual visit.  While this provider is not your primary care provider (PCP), if your PCP is located in our provider database this encounter information will be shared with them immediately following your visit.   [facility] account gives you access to today's visit and all your visits, tests, and labs performed at [facility] " click here if you don't have a [facility] account or go to [email or web address]  Consent: (Patient) Reginald Kirby. provided verbal consent for this virtual visit at the beginning of the encounter.  Current Medications:  Current Outpatient Medications:    azithromycin (ZITHROMAX Z-PAK) 250 MG tablet, As directed, Disp: 6 tablet, Rfl: 0   albuterol (VENTOLIN HFA) 108 (90 Base) MCG/ACT inhaler, Inhale 2 puffs into the lungs every 6 (six) hours as needed for wheezing or shortness of breath., Disp: 6.7 g, Rfl: 0   amLODipine (NORVASC) 10 MG tablet, Take 1 tablet by mouth daily., Disp: 90 tablet, Rfl: 1   atorvastatin (LIPITOR) 80 MG tablet, Take 1 tablet by mouth daily., Disp: 90 tablet, Rfl: 1   benzonatate (TESSALON PERLES) 100 MG capsule, Take 1 capsule (100 mg total) by mouth 3 (three) times daily as needed., Disp: 20 capsule, Rfl: 0   buPROPion (WELLBUTRIN XL) 300 MG 24 hr tablet, Take 1 tablet by mouth daily., Disp: 90 tablet, Rfl: 1   ferrous sulfate 325 (65 FE) MG tablet, Take 325 mg by mouth 2 (two) times daily with a meal., Disp: , Rfl:    glucose blood (FREESTYLE LITE) test strip, 1 each by Other route daily. Dx E11.9, Disp: 100 each, Rfl: 3   losartan-hydrochlorothiazide (HYZAAR) 100-25 MG tablet, Take 1 tablet by mouth daily., Disp: 90 tablet, Rfl: 1   metFORMIN (GLUCOPHAGE) 500 MG tablet, Take 1 tablet by mouth twice daily with meals., Disp: 180 tablet, Rfl: 1   metoprolol tartrate (LOPRESSOR) 25 MG tablet, Take 1/2 tablet by mouth twice daily.,  Disp: 180 tablet, Rfl: 1   nirmatrelvir/ritonavir EUA (PAXLOVID) 20 x 150 MG & 10 x 100MG  TABS, Take 3 tablets by mouth 2 (two) times daily for 5 days., Disp: 30 tablet, Rfl: 0   omeprazole (PRILOSEC) 40 MG capsule, Take 1 capsule by mouth daily., Disp: 90 capsule, Rfl: 1   Semaglutide, 1 MG/DOSE, (OZEMPIC, 1 MG/DOSE,) 4 MG/3ML SOPN, Inject 1 mg as directed once a week., Disp: 9 mL, Rfl: 0   Medications ordered in this encounter:  Meds ordered this encounter  Medications   azithromycin (ZITHROMAX Z-PAK) 250 MG tablet    Sig: As directed    Dispense:  6 tablet    Refill:  0    Order Specific Question:   Supervising Provider    Answer:   Chase Picket [id number]     *If you need refills on other medications prior to your next appointment, please contact your pharmacy*  Follow-Up: Call back or seek an in-person evaluation if the symptoms worsen or if the condition fails to improve as anticipated.  [place] [phone]  Other Instructions Force fluids continue tessalon perles Rest  If you have been instructed to have an in-person evaluation today at a local Urgent Care facility, please use the link below. It will take you to a list of all of our available [facility], including address, phone number and hours of operation. Please do not delay care.  [facility]  If you or a family member do not have a primary care provider, use the link below to schedule a visit and establish care. When you choose a [facility] primary care physician or advanced practice provider, you gain a long-term partner in health. Find a Primary Care Provider  Learn more about [facility]'s in-office and virtual care options: [facility]

## 2022-04-20 NOTE — Progress Notes (Signed)
Virtual Visit Consent   Reginald Kirby., you are scheduled for a virtual visit with Reginald Kirby, [facility] [place], a [facility] provider, today.     Just as with appointments in the office, your consent must be obtained to participate.  Your consent will be active for this visit and any virtual visit you may have with one of our providers in the next 365 days.     If you have a MyChart account, a copy of this consent can be sent to you electronically.  All virtual visits are billed to your insurance company just like a traditional visit in the office.    As this is a virtual visit, video technology does not allow for your provider to perform a traditional examination.  This may limit your provider's ability to fully assess your condition.  If your provider identifies any concerns that need to be evaluated in person or the need to arrange testing (such as labs, EKG, etc.), we will make arrangements to do so.     Although advances in technology are sophisticated, we cannot ensure that it will always work on either your end or our end.  If the connection with a video visit is poor, the visit may have to be switched to a telephone visit.  With either a video or telephone visit, we are not always able to ensure that we have a secure connection.     I need to obtain your verbal consent now.   Are you willing to proceed with your visit today? YES   Reginald Kirby. has provided verbal consent on 04/20/2022 for a virtual visit (video or telephone).   Reginald Hassell Done, FNP   Date: 04/20/2022 9:02 AM   Virtual Visit via Video Note   I, Reginald Kirby, connected with Reginald Kirby. ([id number], 02/15/1976) on 04/20/22 at  9:15 AM EDT by a video-enabled telemedicine application and verified that I am speaking with the correct person using two identifiers.  Location: Patient: Virtual Visit Location Patient: Home Provider: Virtual Visit Location Provider: Mobile   I discussed the  limitations of evaluation and management by telemedicine and the availability of in person appointments. The patient expressed understanding and agreed to proceed.    History of Present Illness: Reginald Kirby. is a 46 y.o. who identifies as a male who was assigned male at birth, and is being seen today for cough.  HPI: Patient does video visit today c/o recent covid diagnosis. He was dx on 04/13/22 and was told to do supportive care. On 04/16/22 he did another video visit and was given albuterol and paxlovid. He still has a very deep cough with sore throat. His phlegm is yellow and thick.     Review of Systems  Constitutional:  Positive for malaise/fatigue. Negative for chills and fever.  HENT:  Positive for congestion and sore throat.   Respiratory:  Positive for cough.   Neurological:  Positive for headaches.    Problems:  Patient Active Problem List   Diagnosis Date Noted   Iron deficiency anemia 05/16/2021   Gastroesophageal reflux disease without esophagitis 05/16/2021   Gout 09/15/2018   Insomnia 05/13/2018   Lumbar radiculopathy 10/13/2015   Family history of colon cancer 07/06/2015   Diabetes mellitus without complication ([place]) 52/84/1324   Lumbar pain 01/21/2015   Dyslipidemia 10/23/2011   Hypertension 10/23/2011    Allergies:  Allergies  Allergen Reactions   Demerol Itching   Medications:  Current Outpatient Medications:  albuterol (VENTOLIN HFA) 108 (90 Base) MCG/ACT inhaler, Inhale 2 puffs into the lungs every 6 (six) hours as needed for wheezing or shortness of breath., Disp: 6.7 g, Rfl: 0   amLODipine (NORVASC) 10 MG tablet, Take 1 tablet by mouth daily., Disp: 90 tablet, Rfl: 1   atorvastatin (LIPITOR) 80 MG tablet, Take 1 tablet by mouth daily., Disp: 90 tablet, Rfl: 1   benzonatate (TESSALON PERLES) 100 MG capsule, Take 1 capsule (100 mg total) by mouth 3 (three) times daily as needed., Disp: 20 capsule, Rfl: 0   buPROPion (WELLBUTRIN XL) 300 MG 24 hr  tablet, Take 1 tablet by mouth daily., Disp: 90 tablet, Rfl: 1   ferrous sulfate 325 (65 FE) MG tablet, Take 325 mg by mouth 2 (two) times daily with a meal., Disp: , Rfl:    glucose blood (FREESTYLE LITE) test strip, 1 each by Other route daily. Dx E11.9, Disp: 100 each, Rfl: 3   losartan-hydrochlorothiazide (HYZAAR) 100-25 MG tablet, Take 1 tablet by mouth daily., Disp: 90 tablet, Rfl: 1   metFORMIN (GLUCOPHAGE) 500 MG tablet, Take 1 tablet by mouth twice daily with meals., Disp: 180 tablet, Rfl: 1   metoprolol tartrate (LOPRESSOR) 25 MG tablet, Take 1/2 tablet by mouth twice daily., Disp: 180 tablet, Rfl: 1   nirmatrelvir/ritonavir EUA (PAXLOVID) 20 x 150 MG & 10 x 100MG  TABS, Take 3 tablets by mouth 2 (two) times daily for 5 days., Disp: 30 tablet, Rfl: 0   omeprazole (PRILOSEC) 40 MG capsule, Take 1 capsule by mouth daily., Disp: 90 capsule, Rfl: 1   Semaglutide, 1 MG/DOSE, (OZEMPIC, 1 MG/DOSE,) 4 MG/3ML SOPN, Inject 1 mg as directed once a week., Disp: 9 mL, Rfl: 0  Observations/Objective: Patient is well-developed, well-nourished in no acute distress.  Resting comfortably  at home.  Head is normocephalic, atraumatic.  No labored breathing.  Speech is clear and coherent with logical content.  Patient is alert and oriented at baseline.  Raspy voice Deep dry cough noted  Assessment and Plan:  . in today with chief complaint of covid   1. Post-COVID chronic cough Force fluids continue tessalon perles Rest  Meds ordered this encounter  Medications   azithromycin (ZITHROMAX Z-PAK) 250 MG tablet    Sig: As directed    Dispense:  6 tablet    Refill:  0    Order Specific Question:   Supervising Provider    Answer:   Gaston Islam Merrilee Jansky     Follow Up Instructions: I discussed the assessment and treatment plan with the patient. The patient was provided an opportunity to ask questions and all were answered. The patient agreed with the plan and demonstrated  an understanding of the instructions.  A copy of instructions were sent to the patient via MyChart.  The patient was advised to call back or seek an in-person evaluation if the symptoms worsen or if the condition fails to improve as anticipated.  Time:  I spent 7 minutes with the patient via telehealth technology discussing the above problems/concerns.    Reginald [id number], FNP

## 2022-05-01 ENCOUNTER — Telehealth: Payer: Self-pay | Admitting: *Deleted

## 2022-05-01 DIAGNOSIS — R059 Cough, unspecified: Secondary | ICD-10-CM

## 2022-05-01 DIAGNOSIS — J069 Acute upper respiratory infection, unspecified: Secondary | ICD-10-CM

## 2022-05-01 DIAGNOSIS — U099 Post covid-19 condition, unspecified: Secondary | ICD-10-CM

## 2022-05-01 NOTE — Telephone Encounter (Signed)
Verbal orders given by Dr. Shari Prows on this pt to order a chest x-ray, for ongoing cough post covid and URI.  Chest x-ray order placed.  The pt is aware of order placement and that he can go to [facility] this week to have this done.  Order placed for the pt to go to [facility] to have chest xray completed.  He is aware that no appt time is needed, just report to GI imagining during business hours, to have test completed.   Pt verbalized understanding and agrees with this plan.

## 2022-05-03 ENCOUNTER — Ambulatory Visit
Admission: RE | Admit: 2022-05-03 | Discharge: 2022-05-03 | Disposition: A | Discharge status: Home / Self Care | Payer: 59 | Source: Ambulatory Visit | Attending: Cardiology | Admitting: Cardiology

## 2022-05-03 DIAGNOSIS — R059 Cough, unspecified: Secondary | ICD-10-CM

## 2022-05-03 DIAGNOSIS — J069 Acute upper respiratory infection, unspecified: Secondary | ICD-10-CM

## 2022-05-03 DIAGNOSIS — R053 Chronic cough: Secondary | ICD-10-CM

## 2022-05-05 ENCOUNTER — Other Ambulatory Visit: Payer: Self-pay | Admitting: Internal Medicine

## 2022-05-05 DIAGNOSIS — I10 Essential (primary) hypertension: Secondary | ICD-10-CM

## 2022-05-24 ENCOUNTER — Other Ambulatory Visit: Payer: Self-pay | Admitting: *Deleted

## 2022-05-24 DIAGNOSIS — U071 COVID-19: Secondary | ICD-10-CM

## 2022-05-24 NOTE — Telephone Encounter (Signed)
From: Gaston Islam. To: Office of Jannifer Rodney, [place] Sent: 05/23/2022 9:25 PM EST Subject: Medication Renewal Request  Refills have been requested for the following medications:   benzonatate (TESSALON PERLES) 100 MG capsule [Christy Hawks]  Preferred pharmacy: [id number] - [place], [place] - [address] Delivery method: [employer]

## 2022-05-30 ENCOUNTER — Other Ambulatory Visit: Payer: Self-pay | Admitting: Internal Medicine

## 2022-05-30 DIAGNOSIS — E119 Type 2 diabetes mellitus without complications: Secondary | ICD-10-CM

## 2022-06-03 ENCOUNTER — Other Ambulatory Visit (HOSPITAL_BASED_OUTPATIENT_CLINIC_OR_DEPARTMENT_OTHER): Payer: Self-pay

## 2022-06-03 MED ORDER — OZEMPIC (1 MG/DOSE) 4 MG/3ML ~~LOC~~ SOPN
1.0000 mg | PEN_INJECTOR | SUBCUTANEOUS | 0 refills | Status: DC
  Filled 2022-06-03: qty 3, 28d supply, fill #0
  Filled 2022-07-03: qty 3, 28d supply, fill #1

## 2022-06-25 ENCOUNTER — Telehealth: Payer: Self-pay | Admitting: Internal Medicine

## 2022-06-25 NOTE — Telephone Encounter (Signed)
Vml for patient to call back and sch toc

## 2022-06-25 NOTE — Telephone Encounter (Signed)
Caller requests on Patient's behalf TOC from Dr. Isaac Bliss to Dr. Cherlynn Kaiser.  Please advise.

## 2022-06-27 ENCOUNTER — Ambulatory Visit: Payer: 59 | Admitting: Internal Medicine

## 2022-07-03 ENCOUNTER — Other Ambulatory Visit (HOSPITAL_BASED_OUTPATIENT_CLINIC_OR_DEPARTMENT_OTHER): Payer: Self-pay

## 2022-07-06 ENCOUNTER — Other Ambulatory Visit (HOSPITAL_BASED_OUTPATIENT_CLINIC_OR_DEPARTMENT_OTHER): Payer: Self-pay

## 2022-07-10 ENCOUNTER — Other Ambulatory Visit (HOSPITAL_BASED_OUTPATIENT_CLINIC_OR_DEPARTMENT_OTHER): Payer: Self-pay

## 2022-07-10 ENCOUNTER — Ambulatory Visit: Payer: Commercial Managed Care - PPO | Admitting: Family Medicine

## 2022-07-10 ENCOUNTER — Encounter: Payer: Self-pay | Admitting: Family Medicine

## 2022-07-10 VITALS — BP 130/80 | HR 72 | Temp 98.7°F | Ht 72.0 in | Wt 214.1 lb

## 2022-07-10 DIAGNOSIS — Z114 Encounter for screening for human immunodeficiency virus [HIV]: Secondary | ICD-10-CM | POA: Diagnosis not present

## 2022-07-10 DIAGNOSIS — E785 Hyperlipidemia, unspecified: Secondary | ICD-10-CM | POA: Diagnosis not present

## 2022-07-10 DIAGNOSIS — D500 Iron deficiency anemia secondary to blood loss (chronic): Secondary | ICD-10-CM

## 2022-07-10 DIAGNOSIS — G4700 Insomnia, unspecified: Secondary | ICD-10-CM

## 2022-07-10 DIAGNOSIS — M1000 Idiopathic gout, unspecified site: Secondary | ICD-10-CM | POA: Diagnosis not present

## 2022-07-10 DIAGNOSIS — K219 Gastro-esophageal reflux disease without esophagitis: Secondary | ICD-10-CM | POA: Diagnosis not present

## 2022-07-10 DIAGNOSIS — I10 Essential (primary) hypertension: Secondary | ICD-10-CM

## 2022-07-10 DIAGNOSIS — E119 Type 2 diabetes mellitus without complications: Secondary | ICD-10-CM | POA: Diagnosis not present

## 2022-07-10 DIAGNOSIS — Z1159 Encounter for screening for other viral diseases: Secondary | ICD-10-CM | POA: Diagnosis not present

## 2022-07-10 MED ORDER — LOSARTAN POTASSIUM-HCTZ 100-25 MG PO TABS
1.0000 | ORAL_TABLET | Freq: Every day | ORAL | 3 refills | Status: DC
  Filled 2022-07-10: qty 90, 90d supply, fill #0
  Filled 2022-10-01: qty 90, 90d supply, fill #1
  Filled 2023-01-19: qty 90, 90d supply, fill #2
  Filled 2023-05-03 (×2): qty 90, 90d supply, fill #3

## 2022-07-10 MED ORDER — OMEPRAZOLE 40 MG PO CPDR
40.0000 mg | DELAYED_RELEASE_CAPSULE | Freq: Every day | ORAL | 1 refills | Status: DC
  Filled 2022-07-10: qty 90, 90d supply, fill #0
  Filled 2022-10-24: qty 90, 90d supply, fill #1

## 2022-07-10 MED ORDER — METOPROLOL SUCCINATE ER 25 MG PO TB24
25.0000 mg | ORAL_TABLET | Freq: Every day | ORAL | 3 refills | Status: DC
  Filled 2022-07-10: qty 90, 90d supply, fill #0
  Filled 2022-10-01: qty 90, 90d supply, fill #1
  Filled 2022-12-30: qty 90, 90d supply, fill #2
  Filled 2023-03-30: qty 90, 90d supply, fill #3

## 2022-07-10 MED ORDER — BUPROPION HCL ER (XL) 300 MG PO TB24
300.0000 mg | ORAL_TABLET | Freq: Every day | ORAL | 1 refills | Status: DC
  Filled 2022-07-10: qty 90, 90d supply, fill #0
  Filled 2022-10-01: qty 90, 90d supply, fill #1

## 2022-07-10 MED ORDER — OZEMPIC (1 MG/DOSE) 4 MG/3ML ~~LOC~~ SOPN
1.0000 mg | PEN_INJECTOR | SUBCUTANEOUS | 3 refills | Status: DC
  Filled 2022-07-10 – 2022-07-30 (×2): qty 9, 84d supply, fill #0

## 2022-07-10 MED ORDER — ATORVASTATIN CALCIUM 80 MG PO TABS
80.0000 mg | ORAL_TABLET | Freq: Every day | ORAL | 3 refills | Status: DC
  Filled 2022-07-10: qty 90, 90d supply, fill #0
  Filled 2022-11-09 (×2): qty 90, 90d supply, fill #1
  Filled 2023-02-06: qty 90, 90d supply, fill #2
  Filled 2023-05-03 (×2): qty 90, 90d supply, fill #3

## 2022-07-10 MED ORDER — METFORMIN HCL 500 MG PO TABS
500.0000 mg | ORAL_TABLET | Freq: Two times a day (BID) | ORAL | 1 refills | Status: DC
  Filled 2022-07-10: qty 180, 90d supply, fill #0
  Filled 2023-06-21 (×2): qty 180, 90d supply, fill #1

## 2022-07-10 NOTE — Patient Instructions (Addendum)
It was very nice to see you today!  [facility] at [place] on the 1st floor Phone number [phone]   Get exercise   PLEASE NOTE:  If you had any lab tests please let us know if you have not heard back within a few days. You may see your results on MyChart before we have a chance to review them but we will give you a call once they are reviewed by [place]. If we ordered any referrals today, please let us know if you have not heard from their office within the next week.   Please try these tips to maintain a healthy lifestyle:  Eat most of your calories during the day when you are active. Eliminate processed foods including packaged sweets (pies, cakes, cookies), reduce intake of potatoes, white bread, white pasta, and white rice. Look for whole grain options, oat flour or almond flour.  Each meal should contain half fruits/vegetables, one quarter protein, and one quarter carbs (no bigger than a computer mouse).  Cut down on sweet beverages. This includes juice, soda, and sweet tea. Also watch fruit intake, though this is a healthier sweet option, it still contains natural sugar! Limit to 3 servings daily.  Drink at least 1 glass of water with each meal and aim for at least 8 glasses per day  Exercise at least 150 minutes every week.

## 2022-07-10 NOTE — Progress Notes (Addendum)
Subjective:     Patient ID: Reginald Kirby., male    DOB: 1975/07/21, 47 y.o.   MRN: [id number]  Chief Complaint  Patient presents with   Transfer of Care    Transfer of Care  Bursitis bilateral hips     HPI TOC  Low hgb-ida.  Fe bid.  Saw heme-ordered labs and hgb electrophoresis and alpha thal studies(pt never had done).   Less fatigue now.  Daughter has anemia as well.  DM type 2-doing well. Ozempic really helped-has lost wt.  Working on diet.  Hasn't started exercising(trying to find work/life balance) HTN-110-120's at home-2 different cuffs.. Anxiety at doctors-metoprol but forgot bid.- Helped anxiety so would like metoprolol LA.  Bursitis B hips-cannot sit in bed or on stool in rooms.  Has to stand.  Achey/deep pain-point tenderness B hips(since lost wt).  Voltaren gel,icey hot, etc not helping HLD-doing well on atorvastatin 80. Marland Kitchen    Health Maintenance Due  Topic Date Due   Hepatitis C Screening  Never done   FOOT EXAM  09/09/2020    Past Medical History:  Diagnosis Date   Anxiety    Arthritis    DDD  BACK   Depression    Diabetes mellitus    GERD (gastroesophageal reflux disease)    Headache    HX MIGRAINES    Hyperlipidemia    Hypertension    Sinusitis     Past Surgical History:  Procedure Laterality Date   CARDIAC CATHETERIZATION     2006    OKAY    epidural injections   2002   LUMBAR LAMINECTOMY/DECOMPRESSION MICRODISCECTOMY N/A 10/13/2015   Procedure: L3-4 L4-5 L5-S1 Laminectomy;  Surgeon: Barnett Abu, MD;  Location: [facility];  Service: Neurosurgery;  Laterality: N/A;  L3-4 L4-5 L5-S1 Laminectomy    Outpatient Medications Prior to Visit  Medication Sig Dispense Refill   amLODipine (NORVASC) 10 MG tablet Take 1 tablet by mouth daily. 90 tablet 1   ferrous sulfate 325 (65 FE) MG tablet Take 325 mg by mouth 2 (two) times daily with a meal.     atorvastatin (LIPITOR) 80 MG tablet Take 1 tablet by mouth daily. 90 tablet 1   buPROPion (WELLBUTRIN  XL) 300 MG 24 hr tablet Take 1 tablet by mouth daily. 90 tablet 1   losartan-hydrochlorothiazide (HYZAAR) 100-25 MG tablet Take 1 tablet by mouth daily. 90 tablet 1   metFORMIN (GLUCOPHAGE) 500 MG tablet Take 1 tablet by mouth twice daily with meals. 180 tablet 1   omeprazole (PRILOSEC) 40 MG capsule Take 1 capsule by mouth daily. 90 capsule 1   Semaglutide, 1 MG/DOSE, (OZEMPIC, 1 MG/DOSE,) 4 MG/3ML SOPN Inject 1 mg as directed once a week. 9 mL 0   albuterol (VENTOLIN HFA) 108 (90 Base) MCG/ACT inhaler Inhale 2 puffs into the lungs every 6 (six) hours as needed for wheezing or shortness of breath. 6.7 g 0   azithromycin (ZITHROMAX Z-PAK) 250 MG tablet As directed 6 tablet 0   benzonatate (TESSALON PERLES) 100 MG capsule Take 1 capsule (100 mg total) by mouth 3 (three) times daily as needed. 20 capsule 0   glucose blood (FREESTYLE LITE) test strip 1 each by Other route daily. Dx E11.9 100 each 3   metoprolol tartrate (LOPRESSOR) 25 MG tablet Take 1/2 tablet by mouth twice daily. 180 tablet 1   No facility-administered medications prior to visit.    Allergies  Allergen Reactions   Demerol Itching   ROS neg/noncontributory except  as noted HPI/below      Objective:     BP 130/80   Pulse 72   Temp 98.7 F (37.1 C) (Temporal)   Ht 6' (1.829 m)   Wt 214 lb 2 oz (97.1 kg)   SpO2 98%   BMI 29.04 kg/m  Wt Readings from Last 3 Encounters:  07/10/22 214 lb 2 oz (97.1 kg)  03/25/22 212 lb 4.8 oz (96.3 kg)  12/26/21 216 lb 12.8 oz (98.3 kg)    Physical Exam   Gen: WDWN NAD HEENT: NCAT, conjunctiva not injected, sclera nonicteric NECK:  supple, no thyromegaly, no nodes, no carotid bruits CARDIAC: RRR, S1S2+, no murmur. DP 2+B LUNGS: CTAB. No wheezes ABDOMEN:  BS+, soft, NTND, No HSM, no masses EXT:  no edema MSK: no gross abnormalities.  Point tenderness bilateral hips NEURO: A&O x3.  CN II-XII intact.  PSYCH: normal mood. Good eye contact     Assessment & Plan:   Problem  List Items Addressed This Visit       Cardiovascular and Mediastinum   Hypertension   Relevant Medications   metoprolol succinate (TOPROL-XL) 25 MG 24 hr tablet   atorvastatin (LIPITOR) 80 MG tablet   losartan-hydrochlorothiazide (HYZAAR) 100-25 MG tablet     Digestive   Gastroesophageal reflux disease without esophagitis   Relevant Medications   omeprazole (PRILOSEC) 40 MG capsule     Endocrine   Diabetes mellitus without complication (HCC)   Relevant Medications   atorvastatin (LIPITOR) 80 MG tablet   losartan-hydrochlorothiazide (HYZAAR) 100-25 MG tablet   metFORMIN (GLUCOPHAGE) 500 MG tablet   Semaglutide, 1 MG/DOSE, (OZEMPIC, 1 MG/DOSE,) 4 MG/3ML SOPN   Other Relevant Orders   Comprehensive metabolic panel     Other   Dyslipidemia   Relevant Medications   atorvastatin (LIPITOR) 80 MG tablet   Other Relevant Orders   Comprehensive metabolic panel   Lipid panel   TSH   Insomnia   Relevant Medications   buPROPion (WELLBUTRIN XL) 300 MG 24 hr tablet   Gout   Relevant Orders   Uric acid   Iron deficiency anemia - Primary   Relevant Orders   IBC + Ferritin   CBC with Differential/Platelet   Vitamin B12   Hgb Fractionation Cascade   Alpha-Thalassemia GenotypR   Sickle Cell Scr   Hemoglobinopathy Evaluation   Other Visit Diagnoses     Encounter for hepatitis C screening test for low risk patient       Relevant Orders   Hepatitis C antibody   Screening for HIV without presence of risk factors       Relevant Orders   HIV Antibody (routine testing w rflx)     1.  Iron deficiency anemia-chronic.  Patient has seen hematology and has had EGD/colonoscopy.  I reviewed the note.  Thoughts were that it could possibly be alpha Thal trait.  Of note, daughter also has anemia.  Continue iron, check CBC, iron studies, B12, hemoglobin electrophoresis, alpha Thal.  If things are back to normal, can stop iron. 2.  Diabetes type 2-chronic.  Very well-controlled.  Patient has  lost weight with Ozempic.  Continue metformin 500 mg twice daily, Ozempic 1 mg daily.  Check CMP, TSH.  Encourage patient to start working on exercise. 3.  Hypertension-chronic.  Well-controlled.  Continue amlodipine 10 mg, Hyzaar 100/25.  Add metoprolol XL 25 mg daily (helps his anxiety) monitor blood pressures.  If dropping low, decrease amlodipine to 5 mg. 4.  Hyperlipidemia-chronic.  Controlled.  Continue atorvastatin 80 mg daily.  Check lipids, TSH, CMP 5.  Gout-chronic.  Well-controlled with hydration/diet.  Patient is also on Hyzaar which is protective as well.  Check uric acid 6.  GERD-chronic.  Well-controlled.  Continue omeprazole 40 mg daily 7.  Bilateral hip pain-advised to follow-up with [facility]-may need injections since failed conservative therapy  Follow-up in 6 months   Meds ordered this encounter  Medications   metoprolol succinate (TOPROL-XL) 25 MG 24 hr tablet    Sig: Take 1 tablet (25 mg total) by mouth daily.    Dispense:  90 tablet    Refill:  3   atorvastatin (LIPITOR) 80 MG tablet    Sig: Take 1 tablet (80 mg total) by mouth daily.    Dispense:  90 tablet    Refill:  3   buPROPion (WELLBUTRIN XL) 300 MG 24 hr tablet    Sig: Take 1 tablet (300 mg total) by mouth daily.    Dispense:  90 tablet    Refill:  1   losartan-hydrochlorothiazide (HYZAAR) 100-25 MG tablet    Sig: Take 1 tablet by mouth daily.    Dispense:  90 tablet    Refill:  3   metFORMIN (GLUCOPHAGE) 500 MG tablet    Sig: Take 1 tablet (500 mg total) by mouth 2 (two) times daily with a meal.    Dispense:  180 tablet    Refill:  1   omeprazole (PRILOSEC) 40 MG capsule    Sig: Take 1 capsule (40 mg total) by mouth daily.    Dispense:  90 capsule    Refill:  1   Semaglutide, 1 MG/DOSE, (OZEMPIC, 1 MG/DOSE,) 4 MG/3ML SOPN    Sig: Inject 1 mg into the skin once a week.    Dispense:  9 mL    Refill:  3    Please schedule a physical for more refills    Wellington Hampshire, MD

## 2022-07-11 LAB — CBC WITH DIFFERENTIAL/PLATELET
Basophils Absolute: 0 10*3/uL (ref 0.0–0.1)
Basophils Relative: 0.5 % (ref 0.0–3.0)
Eosinophils Absolute: 0 10*3/uL (ref 0.0–0.7)
Eosinophils Relative: 1.1 % (ref 0.0–5.0)
HCT: 38.7 % — ABNORMAL LOW (ref 39.0–52.0)
Hemoglobin: 12.4 g/dL — ABNORMAL LOW (ref 13.0–17.0)
Lymphocytes Relative: 49.8 % — ABNORMAL HIGH (ref 12.0–46.0)
Lymphs Abs: 1.9 10*3/uL (ref 0.7–4.0)
MCHC: 32.1 g/dL (ref 30.0–36.0)
MCV: 74.8 fl — ABNORMAL LOW (ref 78.0–100.0)
Monocytes Absolute: 0.2 10*3/uL (ref 0.1–1.0)
Monocytes Relative: 6.2 % (ref 3.0–12.0)
Neutro Abs: 1.6 10*3/uL (ref 1.4–7.7)
Neutrophils Relative %: 42.4 % — ABNORMAL LOW (ref 43.0–77.0)
Platelets: 327 10*3/uL (ref 150.0–400.0)
RBC: 5.17 Mil/uL (ref 4.22–5.81)
RDW: 14.1 % (ref 11.5–15.5)
WBC: 3.8 10*3/uL — ABNORMAL LOW (ref 4.0–10.5)

## 2022-07-11 LAB — IBC + FERRITIN
Ferritin: 22.5 ng/mL (ref 22.0–322.0)
Iron: 78 ug/dL (ref 42–165)
Saturation Ratios: 18 % — ABNORMAL LOW (ref 20.0–50.0)
TIBC: 434 ug/dL (ref 250.0–450.0)
Transferrin: 310 mg/dL (ref 212.0–360.0)

## 2022-07-11 LAB — COMPREHENSIVE METABOLIC PANEL
ALT: 22 U/L (ref 0–53)
AST: 15 U/L (ref 0–37)
Albumin: 4.6 g/dL (ref 3.5–5.2)
Alkaline Phosphatase: 58 U/L (ref 39–117)
BUN: 11 mg/dL (ref 6–23)
CO2: 32 mEq/L (ref 19–32)
Calcium: 9.6 mg/dL (ref 8.4–10.5)
Chloride: 101 mEq/L (ref 96–112)
Creatinine, Ser: 0.92 mg/dL (ref 0.40–1.50)
GFR: 99.86 mL/min (ref >60.00)
Glucose, Bld: 109 mg/dL — ABNORMAL HIGH (ref 70–99)
Potassium: 4.2 mEq/L (ref 3.5–5.1)
Sodium: 143 mEq/L (ref 135–145)
Total Bilirubin: 0.3 mg/dL (ref 0.2–1.2)
Total Protein: 7.1 g/dL (ref 6.0–8.3)

## 2022-07-11 LAB — LIPID PANEL
Cholesterol: 99 mg/dL (ref 0–200)
HDL: 44.9 mg/dL (ref >39.00)
LDL Cholesterol: 41 mg/dL (ref 0–99)
NonHDL: 54.4
Total CHOL/HDL Ratio: 2
Triglycerides: 65 mg/dL (ref 0.0–149.0)
VLDL: 13 mg/dL (ref 0.0–40.0)

## 2022-07-11 LAB — VITAMIN B12: Vitamin B-12: 522 pg/mL (ref 211–911)

## 2022-07-11 LAB — TSH: TSH: 1.7 u[IU]/mL (ref 0.35–5.50)

## 2022-07-11 LAB — URIC ACID: Uric Acid, Serum: 4.9 mg/dL (ref 4.0–7.8)

## 2022-07-11 NOTE — Progress Notes (Signed)
Labs are great except the hemoglobin-await all the other tests for that.

## 2022-07-15 LAB — HEMOGLOBINOPATHY EVALUATION
Fetal Hemoglobin Testing: <1 % (ref 0.0–1.9)
HCT: 39.7 % (ref 38.5–50.0)
Hemoglobin A2 - HGBRFX: 2.3 % (ref 2.2–3.2)
Hemoglobin: 12.3 g/dL — ABNORMAL LOW (ref 13.2–17.1)
Hgb A: 97.7 % (ref >96.0)
MCH: 23.7 pg — ABNORMAL LOW (ref 27.0–33.0)
MCV: 76.6 fL — ABNORMAL LOW (ref 80.0–100.0)
RBC: 5.18 10*6/uL (ref 4.20–5.80)
RDW: 13.1 % (ref 11.0–15.0)

## 2022-07-15 LAB — HEPATITIS C ANTIBODY: Hepatitis C Ab: NONREACTIVE

## 2022-07-15 LAB — HIV ANTIBODY (ROUTINE TESTING W REFLEX): HIV 1&2 Ab, 4th Generation: NONREACTIVE

## 2022-07-15 LAB — SICKLE CELL SCREEN: Sickle Solubility Test - HGBRFX: NEGATIVE

## 2022-07-16 ENCOUNTER — Other Ambulatory Visit: Payer: Self-pay | Admitting: Internal Medicine

## 2022-07-16 DIAGNOSIS — K219 Gastro-esophageal reflux disease without esophagitis: Secondary | ICD-10-CM

## 2022-07-16 DIAGNOSIS — G4700 Insomnia, unspecified: Secondary | ICD-10-CM

## 2022-07-16 DIAGNOSIS — I10 Essential (primary) hypertension: Secondary | ICD-10-CM

## 2022-07-16 DIAGNOSIS — E785 Hyperlipidemia, unspecified: Secondary | ICD-10-CM

## 2022-07-22 NOTE — Progress Notes (Unsigned)
    Benito Mccreedy D.[facility] Phone: [phone]   Assessment and Plan:     There are no diagnoses linked to this encounter.  ***   Pertinent previous records reviewed include ***   Follow Up: ***     Subjective:   I, Khyran Riera, am serving as a Education administrator for Doctor Glennon Mac  Chief Complaint: bilateral hip pain   HPI:   07/23/2022 Patient is a 47 year old male complaining  of bilateral hip pain. Patient states  Relevant Historical Information: ***  Additional pertinent review of systems negative.   Current Outpatient Medications:    amLODipine (NORVASC) 10 MG tablet, Take 1 tablet by mouth daily., Disp: 90 tablet, Rfl: 1   atorvastatin (LIPITOR) 80 MG tablet, Take 1 tablet (80 mg total) by mouth daily., Disp: 90 tablet, Rfl: 3   buPROPion (WELLBUTRIN XL) 300 MG 24 hr tablet, Take 1 tablet (300 mg total) by mouth daily., Disp: 90 tablet, Rfl: 1   ferrous sulfate 325 (65 FE) MG tablet, Take 325 mg by mouth 2 (two) times daily with a meal., Disp: , Rfl:    losartan-hydrochlorothiazide (HYZAAR) 100-25 MG tablet, Take 1 tablet by mouth daily., Disp: 90 tablet, Rfl: 3   metFORMIN (GLUCOPHAGE) 500 MG tablet, Take 1 tablet (500 mg total) by mouth 2 (two) times daily with a meal., Disp: 180 tablet, Rfl: 1   metoprolol succinate (TOPROL-XL) 25 MG 24 hr tablet, Take 1 tablet (25 mg total) by mouth daily., Disp: 90 tablet, Rfl: 3   omeprazole (PRILOSEC) 40 MG capsule, Take 1 capsule (40 mg total) by mouth daily., Disp: 90 capsule, Rfl: 1   Semaglutide, 1 MG/DOSE, (OZEMPIC, 1 MG/DOSE,) 4 MG/3ML SOPN, Inject 1 mg into the skin once a week., Disp: 9 mL, Rfl: 3   Objective:     There were no vitals filed for this visit.    There is no height or weight on file to calculate BMI.    Physical Exam:    ***   Electronically signed by:  Benito Mccreedy D.[facility] 12:03 PM 07/22/22

## 2022-07-23 ENCOUNTER — Ambulatory Visit: Payer: Commercial Managed Care - PPO | Admitting: Sports Medicine

## 2022-07-23 VITALS — BP 120/80 | HR 78 | Ht 72.0 in | Wt 209.0 lb

## 2022-07-23 DIAGNOSIS — M7062 Trochanteric bursitis, left hip: Secondary | ICD-10-CM

## 2022-07-23 DIAGNOSIS — M7061 Trochanteric bursitis, right hip: Secondary | ICD-10-CM

## 2022-07-23 NOTE — Patient Instructions (Addendum)
Good to see you  Hip HEP As needed follow up , if no improvement 3 week follow up

## 2022-07-24 LAB — HGB FRACTIONATION CASCADE
Hgb A2: 2.4 % (ref 1.8–3.2)
Hgb A: 97.6 % (ref 96.4–98.8)
Hgb F: 0 % (ref 0.0–2.0)
Hgb S: 0 %

## 2022-07-24 LAB — ALPHA-THALASSEMIA GENOTYPR

## 2022-07-30 ENCOUNTER — Other Ambulatory Visit: Payer: Self-pay | Admitting: Family Medicine

## 2022-07-31 ENCOUNTER — Other Ambulatory Visit (HOSPITAL_BASED_OUTPATIENT_CLINIC_OR_DEPARTMENT_OTHER): Payer: Self-pay

## 2022-07-31 MED ORDER — FERROUS SULFATE 325 (65 FE) MG PO TABS
325.0000 mg | ORAL_TABLET | Freq: Two times a day (BID) | ORAL | 3 refills | Status: DC
  Filled 2022-07-31: qty 90, 45d supply, fill #0

## 2022-08-01 ENCOUNTER — Other Ambulatory Visit (HOSPITAL_BASED_OUTPATIENT_CLINIC_OR_DEPARTMENT_OTHER): Payer: Self-pay

## 2022-10-02 ENCOUNTER — Other Ambulatory Visit (HOSPITAL_BASED_OUTPATIENT_CLINIC_OR_DEPARTMENT_OTHER): Payer: Self-pay

## 2022-10-04 ENCOUNTER — Encounter: Payer: Self-pay | Admitting: Family Medicine

## 2022-10-04 ENCOUNTER — Other Ambulatory Visit: Payer: Self-pay | Admitting: Family Medicine

## 2022-10-04 ENCOUNTER — Other Ambulatory Visit (HOSPITAL_BASED_OUTPATIENT_CLINIC_OR_DEPARTMENT_OTHER): Payer: Self-pay

## 2022-10-04 DIAGNOSIS — I10 Essential (primary) hypertension: Secondary | ICD-10-CM

## 2022-10-04 MED ORDER — OZEMPIC (2 MG/DOSE) 8 MG/3ML ~~LOC~~ SOPN
2.0000 mg | PEN_INJECTOR | SUBCUTANEOUS | 3 refills | Status: DC
  Filled 2022-10-04 – 2022-10-16 (×2): qty 3, 28d supply, fill #0
  Filled 2022-10-24 – 2022-11-09 (×3): qty 3, 28d supply, fill #1
  Filled 2022-12-07 (×2): qty 3, 28d supply, fill #2
  Filled 2023-01-13: qty 3, 28d supply, fill #3
  Filled 2023-02-06: qty 3, 28d supply, fill #4
  Filled 2023-03-06: qty 3, 28d supply, fill #5
  Filled 2023-04-10: qty 3, 28d supply, fill #6
  Filled 2023-05-03 (×2): qty 3, 28d supply, fill #7
  Filled 2023-05-24 – 2023-05-26 (×2): qty 3, 28d supply, fill #8
  Filled 2023-06-21 (×2): qty 3, 28d supply, fill #9
  Filled 2023-07-19 (×2): qty 3, 28d supply, fill #10
  Filled 2023-08-16 (×2): qty 3, 28d supply, fill #11

## 2022-10-04 MED ORDER — AMLODIPINE BESYLATE 10 MG PO TABS
10.0000 mg | ORAL_TABLET | Freq: Every day | ORAL | 0 refills | Status: DC
Start: 2022-10-04 — End: 2023-01-08
  Filled 2022-10-04: qty 90, 90d supply, fill #0

## 2022-10-16 ENCOUNTER — Other Ambulatory Visit (HOSPITAL_BASED_OUTPATIENT_CLINIC_OR_DEPARTMENT_OTHER): Payer: Self-pay

## 2022-10-24 ENCOUNTER — Other Ambulatory Visit (HOSPITAL_BASED_OUTPATIENT_CLINIC_OR_DEPARTMENT_OTHER): Payer: Self-pay

## 2022-10-31 ENCOUNTER — Telehealth: Payer: Commercial Managed Care - PPO | Admitting: Physician Assistant

## 2022-10-31 ENCOUNTER — Other Ambulatory Visit (HOSPITAL_BASED_OUTPATIENT_CLINIC_OR_DEPARTMENT_OTHER): Payer: Self-pay

## 2022-10-31 DIAGNOSIS — M10072 Idiopathic gout, left ankle and foot: Secondary | ICD-10-CM

## 2022-10-31 MED ORDER — INDOMETHACIN 50 MG PO CAPS
50.0000 mg | ORAL_CAPSULE | Freq: Three times a day (TID) | ORAL | 0 refills | Status: DC
Start: 2022-10-31 — End: 2023-01-08
  Filled 2022-10-31: qty 30, 10d supply, fill #0

## 2022-10-31 NOTE — Progress Notes (Signed)
E-Visit for Gout Symptoms  We are sorry that you are not feeling well. We are here to help!  Based on what you shared with me it looks like you have a flare of your gout.  Gout is a form of arthritis. It can cause pain and swelling in the joints. At first, it tends to affect only 1 joint - most frequently the big toe. It happens in people who have too much uric acid in the blood. Uric acid is a chemical that is produced when the body breaks down certain foods. Uric acid can form sharp needle-like crystals that build up in the joints and cause pain. Uric acid crystals can also form inside the tubes that carry urine from the kidneys to the bladder. These crystals can turn into "kidney stones" that can cause pain and problems with the flow of urine. People with gout get sudden "flares" or attacks of severe pain, most often the big toe, ankle, or knee. Often the joint also turns red and swells. Usually, only 1 joint is affected, but some people have pain in more than 1 joint. Gout flares tend to happen more often during the night.  The pain from gout can be extreme. The pain and swelling are worst at the beginning of a gout flare. The symptoms then get better within a few days to weeks. It is not clear how the body "turns off" a gout flare.  Do not start any NEW preventative medicine until the gout has cleared completely. However, If you are already on Probenecid or Allopurinol for CHRONIC gout, you may continue taking this during an active flare up  I have prescribed Indomethacin '50mg'$  three times daily for moderate to severe pain for no more than 7 days   HOME CARE Losing weight can help relieve gout. It's not clear that following a specific diet plan will help with gout symptoms but eating a balanced diet can help improve your overall health. It can also help you lose weight, if you are overweight. In general, a healthy diet includes plenty of fruits, vegetables, whole grains, and low-fat dairy  products (labelled "low fat", skim, 2%). Avoid sugar sweetened drinks (including sodas, tea, juice and juice blends, coffee drinks and sports drinks) Limit alcohol to 1-2 drinks of beer, spirits or wine daily these can make gout flares worse. Some people with gout also have other health problems, such as heart disease, high blood pressure, kidney disease, or obesity. If you have any of these issues, it's important to work with your doctor to manage them. This can help improve your overall health and might also help with your gout.  GET HELP RIGHT AWAY IF: Your symptoms persist after you have completed your treatment plan You develop severe diarrhea You develop abnormal sensations  You develop vomiting,   You develop weakness  You develop abdominal pain  FOLLOW UP WITH YOUR PRIMARY PROVIDER IF: If your symptoms do not improve within 10 days  MAKE SURE YOU  Understand these instructions. Will watch your condition. Will get help right away if you are not doing well or get worse.  Thank you for choosing an e-visit.  Your e-visit answers were reviewed by a board certified advanced clinical practitioner to complete your personal care plan. Depending upon the condition, your plan could have included both over the counter or prescription medications.  Please review your pharmacy choice. Make sure the pharmacy is open so you can pick up prescription now. If there is a problem,  you may contact your provider through [employer] and have the prescription routed to another pharmacy.  Your safety is important to [place]. If you have drug allergies check your prescription carefully.   For the next 24 hours you can use MyChart to ask questions about today's visit, request a non-urgent call back, or ask for a work or school excuse. You will get an email in the next two days asking about your experience. I hope that your e-visit has been valuable and will speed your recovery.  I have spent 5 minutes in  review of e-visit questionnaire, review and updating patient chart, medical decision making and response to patient.   Margaretann Loveless, PA-C

## 2022-11-09 ENCOUNTER — Other Ambulatory Visit (HOSPITAL_BASED_OUTPATIENT_CLINIC_OR_DEPARTMENT_OTHER): Payer: Self-pay

## 2022-11-12 ENCOUNTER — Other Ambulatory Visit (HOSPITAL_BASED_OUTPATIENT_CLINIC_OR_DEPARTMENT_OTHER): Payer: Self-pay

## 2022-11-24 ENCOUNTER — Encounter: Payer: Self-pay | Admitting: Family Medicine

## 2022-12-07 ENCOUNTER — Other Ambulatory Visit (HOSPITAL_BASED_OUTPATIENT_CLINIC_OR_DEPARTMENT_OTHER): Payer: Self-pay

## 2022-12-28 ENCOUNTER — Telehealth: Payer: Commercial Managed Care - PPO | Admitting: Family Medicine

## 2022-12-28 DIAGNOSIS — J019 Acute sinusitis, unspecified: Secondary | ICD-10-CM | POA: Diagnosis not present

## 2022-12-28 MED ORDER — AMOXICILLIN-POT CLAVULANATE 875-125 MG PO TABS
1.0000 | ORAL_TABLET | Freq: Two times a day (BID) | ORAL | 0 refills | Status: AC
Start: 2022-12-28 — End: 2023-01-04

## 2022-12-28 NOTE — Progress Notes (Signed)

## 2023-01-08 ENCOUNTER — Ambulatory Visit: Payer: Commercial Managed Care - PPO | Admitting: Family Medicine

## 2023-01-08 ENCOUNTER — Other Ambulatory Visit: Payer: Self-pay

## 2023-01-08 ENCOUNTER — Other Ambulatory Visit (HOSPITAL_BASED_OUTPATIENT_CLINIC_OR_DEPARTMENT_OTHER): Payer: Self-pay

## 2023-01-08 ENCOUNTER — Encounter: Payer: Self-pay | Admitting: Family Medicine

## 2023-01-08 VITALS — BP 126/78 | HR 74 | Temp 98.2°F | Resp 18 | Ht 72.0 in | Wt 213.0 lb

## 2023-01-08 DIAGNOSIS — Z7984 Long term (current) use of oral hypoglycemic drugs: Secondary | ICD-10-CM

## 2023-01-08 DIAGNOSIS — I10 Essential (primary) hypertension: Secondary | ICD-10-CM

## 2023-01-08 DIAGNOSIS — E785 Hyperlipidemia, unspecified: Secondary | ICD-10-CM

## 2023-01-08 DIAGNOSIS — Z0000 Encounter for general adult medical examination without abnormal findings: Secondary | ICD-10-CM

## 2023-01-08 DIAGNOSIS — S9032XA Contusion of left foot, initial encounter: Secondary | ICD-10-CM | POA: Diagnosis not present

## 2023-01-08 DIAGNOSIS — S40012A Contusion of left shoulder, initial encounter: Secondary | ICD-10-CM | POA: Diagnosis not present

## 2023-01-08 DIAGNOSIS — E119 Type 2 diabetes mellitus without complications: Secondary | ICD-10-CM

## 2023-01-08 DIAGNOSIS — Z7985 Long-term (current) use of injectable non-insulin antidiabetic drugs: Secondary | ICD-10-CM

## 2023-01-08 DIAGNOSIS — F419 Anxiety disorder, unspecified: Secondary | ICD-10-CM | POA: Insufficient documentation

## 2023-01-08 DIAGNOSIS — G4700 Insomnia, unspecified: Secondary | ICD-10-CM

## 2023-01-08 DIAGNOSIS — Z8249 Family history of ischemic heart disease and other diseases of the circulatory system: Secondary | ICD-10-CM

## 2023-01-08 DIAGNOSIS — D508 Other iron deficiency anemias: Secondary | ICD-10-CM | POA: Diagnosis not present

## 2023-01-08 DIAGNOSIS — S9031XA Contusion of right foot, initial encounter: Secondary | ICD-10-CM

## 2023-01-08 DIAGNOSIS — K219 Gastro-esophageal reflux disease without esophagitis: Secondary | ICD-10-CM

## 2023-01-08 DIAGNOSIS — M5416 Radiculopathy, lumbar region: Secondary | ICD-10-CM | POA: Diagnosis not present

## 2023-01-08 LAB — CBC WITH DIFFERENTIAL/PLATELET
Basophils Absolute: 0 10*3/uL (ref 0.0–0.1)
Basophils Relative: 0.4 % (ref 0.0–3.0)
Eosinophils Absolute: 0.1 10*3/uL (ref 0.0–0.7)
Eosinophils Relative: 1.3 % (ref 0.0–5.0)
HCT: 41.3 % (ref 39.0–52.0)
Hemoglobin: 12.8 g/dL — ABNORMAL LOW (ref 13.0–17.0)
Lymphocytes Relative: 37.1 % (ref 12.0–46.0)
Lymphs Abs: 1.7 10*3/uL (ref 0.7–4.0)
MCHC: 31 g/dL (ref 30.0–36.0)
MCV: 75.5 fl — ABNORMAL LOW (ref 78.0–100.0)
Monocytes Absolute: 0.3 10*3/uL (ref 0.1–1.0)
Monocytes Relative: 6.8 % (ref 3.0–12.0)
Neutro Abs: 2.5 10*3/uL (ref 1.4–7.7)
Neutrophils Relative %: 54.4 % (ref 43.0–77.0)
Platelets: 328 10*3/uL (ref 150.0–400.0)
RBC: 5.47 Mil/uL (ref 4.22–5.81)
RDW: 13.6 % (ref 11.5–15.5)
WBC: 4.5 10*3/uL (ref 4.0–10.5)

## 2023-01-08 LAB — COMPREHENSIVE METABOLIC PANEL
ALT: 33 U/L (ref 0–53)
AST: 21 U/L (ref 0–37)
Albumin: 4.6 g/dL (ref 3.5–5.2)
Alkaline Phosphatase: 67 U/L (ref 39–117)
BUN: 12 mg/dL (ref 6–23)
CO2: 30 mEq/L (ref 19–32)
Calcium: 10 mg/dL (ref 8.4–10.5)
Chloride: 101 mEq/L (ref 96–112)
Creatinine, Ser: 1.02 mg/dL (ref 0.40–1.50)
GFR: 87.93 mL/min (ref >60.00)
Glucose, Bld: 128 mg/dL — ABNORMAL HIGH (ref 70–99)
Potassium: 4.2 mEq/L (ref 3.5–5.1)
Sodium: 140 mEq/L (ref 135–145)
Total Bilirubin: 0.3 mg/dL (ref 0.2–1.2)
Total Protein: 7.5 g/dL (ref 6.0–8.3)

## 2023-01-08 LAB — MICROALBUMIN / CREATININE URINE RATIO
Creatinine,U: 118.4 mg/dL
Microalb Creat Ratio: 2.8 mg/g (ref 0.0–30.0)
Microalb, Ur: 3.3 mg/dL — ABNORMAL HIGH (ref 0.0–1.9)

## 2023-01-08 LAB — VITAMIN B12: Vitamin B-12: 474 pg/mL (ref 211–911)

## 2023-01-08 LAB — LIPID PANEL
Cholesterol: 115 mg/dL (ref 0–200)
HDL: 51.1 mg/dL (ref >39.00)
LDL Cholesterol: 44 mg/dL (ref 0–99)
NonHDL: 63.94
Total CHOL/HDL Ratio: 2
Triglycerides: 98 mg/dL (ref 0.0–149.0)
VLDL: 19.6 mg/dL (ref 0.0–40.0)

## 2023-01-08 LAB — HEMOGLOBIN A1C: Hgb A1c MFr Bld: 6.2 % (ref 4.6–6.5)

## 2023-01-08 MED ORDER — OMEPRAZOLE 40 MG PO CPDR
40.0000 mg | DELAYED_RELEASE_CAPSULE | Freq: Every day | ORAL | 1 refills | Status: DC
Start: 2023-01-08 — End: 2023-07-30
  Filled 2023-01-08 – 2023-01-19 (×2): qty 90, 90d supply, fill #0
  Filled 2023-05-03 (×2): qty 90, 90d supply, fill #1

## 2023-01-08 MED ORDER — AMLODIPINE BESYLATE 10 MG PO TABS
10.0000 mg | ORAL_TABLET | Freq: Every day | ORAL | 1 refills | Status: DC
Start: 2023-01-08 — End: 2023-09-18
  Filled 2023-01-08: qty 90, 90d supply, fill #0
  Filled 2023-06-21 (×2): qty 90, 90d supply, fill #1

## 2023-01-08 MED ORDER — BUPROPION HCL ER (XL) 300 MG PO TB24
300.0000 mg | ORAL_TABLET | Freq: Every day | ORAL | 1 refills | Status: DC
Start: 2023-01-08 — End: 2023-01-08
  Filled 2023-01-08: qty 90, 90d supply, fill #0

## 2023-01-08 MED ORDER — BUPROPION HCL ER (XL) 300 MG PO TB24
300.0000 mg | ORAL_TABLET | Freq: Every day | ORAL | 1 refills | Status: DC
Start: 2023-01-08 — End: 2023-10-10
  Filled 2023-06-21: qty 90, 90d supply, fill #0
  Filled 2023-09-18: qty 90, 90d supply, fill #1

## 2023-01-08 NOTE — Assessment & Plan Note (Signed)
Worsening. Harder to stand longer than 50min-will refer back to Dr. Danielle Dess

## 2023-01-08 NOTE — Assessment & Plan Note (Signed)
Chronic.  Controlled.  Continue hyzaar 100/25 and metoprolol lx 25mg , and amlodipine 10mg  daily

## 2023-01-08 NOTE — Progress Notes (Signed)
Subjective:     Patient ID: Reginald Kirby., male    DOB: 30-Oct-1975, 47 y.o.   MRN: [id number]  Chief Complaint  Patient presents with   Medical Management of Chronic Issues    6 month follow-up on dm, htn Fell yesterday in the shower, bruised left shoulder and foot Fasting     HPI He is accompanied by his wife.   DM - Taking Ozempic 2 mg and Metformin 500 mg once/day. Not checking sugars regularly, but notes when he has checked it is in 130-140s and a few times 80-90in the mornings. Overall is eating healthier and having smaller food portions. Has some constipation with medications, stopped taking iron supplements. Has taken stool softener to help. Some mild nausea in the mornings, resolves after eating breakfast. Also has occasional burning/tingling sensation in his feet after a long day at work. Missed his last eye appt., plans to reschedule soon.   HTN - Pt is on Amlodipine 10 mg, Losartan-hydrochlorothiazide 100-25 mg.  Bp's is well controlled and running 120s/80-low 90s at home.  No ha/dizziness/cp/palp/edema/cough/sob.  Fall - Had a fall while cleaning the shower yesterday. He hit his head against the shower wall and has a small knot on the back of his head. No LOC. He also injured his left shoulder and has bruises on right medial foot and left dorsal foot. He took naproxen and Tylenol arthritis. He iced his feet and put a heating pad on his left shoulder for a few minutes. Wasn't able to sleep on left shoulder last night. Painful when walking due to bruise on left foot. No loss of consciousness. Wife confirms he has not shown any signs of confusion or unusual behavior. Not on blood thinner.   Back pain - He endorses back pain with left sided sciatica. He can hear his back cracking when bending over. Was seen by Dr. Danielle Dess in 2017 for similar sx. Back pain has limited his ability to exercise.   Anxiety -  He is taking Wellbutrin XL 300 mg. Occasionally has trouble sleeping due to  anxiety. Can fall asleep easily but tends to wake up in the middle of the night. No SI  Fmhx of DM and HLD. Mom had pacemaker. Maternal grandma had MI 91's and maternal grandpa had stroke.  Would like CT score 7.  Annual  Health Maintenance Due  Topic Date Due   HEMOGLOBIN A1C  09/24/2022   OPHTHALMOLOGY EXAM  11/27/2022   Diabetic kidney evaluation - Urine ACR  11/29/2022    Past Medical History:  Diagnosis Date   Anxiety    Arthritis    DDD  BACK   Depression    Diabetes mellitus    GERD (gastroesophageal reflux disease)    Headache    HX MIGRAINES    Hyperlipidemia    Hypertension    Sinusitis     Past Surgical History:  Procedure Laterality Date   CARDIAC CATHETERIZATION     2006    OKAY    epidural injections   2002   LUMBAR LAMINECTOMY/DECOMPRESSION MICRODISCECTOMY N/A 10/13/2015   Procedure: L3-4 L4-5 L5-S1 Laminectomy;  Surgeon: Barnett Abu, MD;  Location: [facility];  Service: Neurosurgery;  Laterality: N/A;  L3-4 L4-5 L5-S1 Laminectomy     Current Outpatient Medications:    atorvastatin (LIPITOR) 80 MG tablet, Take 1 tablet (80 mg total) by mouth daily., Disp: 90 tablet, Rfl: 3   losartan-hydrochlorothiazide (HYZAAR) 100-25 MG tablet, Take 1 tablet by mouth daily., Disp:  90 tablet, Rfl: 3   metFORMIN (GLUCOPHAGE) 500 MG tablet, Take 1 tablet (500 mg total) by mouth 2 (two) times daily with a meal. (Patient taking differently: Take 500 mg by mouth daily with breakfast.), Disp: 180 tablet, Rfl: 1   metoprolol succinate (TOPROL-XL) 25 MG 24 hr tablet, Take 1 tablet (25 mg total) by mouth daily., Disp: 90 tablet, Rfl: 3   Semaglutide, 2 MG/DOSE, (OZEMPIC, 2 MG/DOSE,) 8 MG/3ML SOPN, Inject 2 mg into the skin once a week., Disp: 9 mL, Rfl: 3   amLODipine (NORVASC) 10 MG tablet, Take 1 tablet (10 mg total) by mouth daily., Disp: 90 tablet, Rfl: 1   buPROPion (WELLBUTRIN XL) 300 MG 24 hr tablet, Take 1 tablet (300 mg total) by mouth daily., Disp: 90 tablet, Rfl: 1    omeprazole (PRILOSEC) 40 MG capsule, Take 1 capsule (40 mg total) by mouth daily., Disp: 90 capsule, Rfl: 1  Allergies  Allergen Reactions   Demerol Itching   ROS neg/noncontributory except as noted HPI/below ROS: Gen: no fever, chills  Skin: no rash, itching ENT: no ear pain, ear drainage, nasal congestion, rhinorrhea, sinus pressure, sore throat Eyes: no blurry vision, double vision Resp: no cough, wheeze,SOB CV: no CP, palpitations, LE edema,  GI: no heartburn, n/v/d/c, abd pain GU: no dysuria, urgency, frequency, hematuria MSK: HPI Neuro: no dizziness, headache, weakness, vertigo      Objective:     BP 126/78 (BP Location: Left Arm, Patient Position: Sitting, Cuff Size: Large)   Pulse 74   Temp 98.2 F (36.8 C) (Temporal)   Resp 18   Ht 6' (1.829 m)   Wt 213 lb (96.6 kg)   SpO2 98%   BMI 28.89 kg/m  Wt Readings from Last 3 Encounters:  01/08/23 213 lb (96.6 kg)  07/23/22 209 lb (94.8 kg)  07/10/22 214 lb 2 oz (97.1 kg)    Physical Exam   Gen: WDWN NAD HEENT: NCAT, conjunctiva not injected, sclera nonicteric NECK:  supple, no thyromegaly, no nodes, no carotid bruits CARDIAC: RRR, S1S2+, no murmur. DP 2+B LUNGS: CTAB. No wheezes ABDOMEN:  BS+, soft, NTND, No HSM, no masses EXT:  no edema MSK: no gross abnormalities.  NEURO: A&O x3.  CN II-XII intact.  PSYCH: normal mood. Good eye contact  +Tenderness and swelling above left scapula. Good rom w/some pain. +small hematoma center lower posterior head +Bruise on Right medial great toe MTP joint +Bruise on Left dorsal of the foot, lateral side    Assessment & Plan:  Wellness examination  Primary hypertension Assessment & Plan: Chronic.  Controlled.  Continue hyzaar 100/25 and metoprolol lx 25mg , and amlodipine 10mg  daily  Orders: -     amLODIPine Besylate; Take 1 tablet (10 mg total) by mouth daily.  Dispense: 90 tablet; Refill: 1  Diabetes mellitus without complication (HCC) Assessment &  Plan: Chronic.  Controlled.  Continue diet/exercise.  Continue ozempic 2mg  weekly, metformin 500mg  daily.  Foot exam done 01/08/23.  Get eye exam done  Orders: -     Comprehensive metabolic panel -     Hemoglobin A1c -     Lipid panel -     Microalbumin / creatinine urine ratio -     Lipoprotein A (LPA) -     CT CARDIAC SCORING (SELF PAY ONLY); Future  Insomnia, unspecified type  Gastroesophageal reflux disease without esophagitis -     Omeprazole; Take 1 capsule (40 mg total) by mouth daily.  Dispense: 90 capsule; Refill: 1  Other iron deficiency anemia Assessment & Plan: Pt intol of PO iron.  Check labs.   Orders: -     Vitamin B12 -     CBC with Differential/Platelet -     Iron, TIBC and Ferritin Panel  Family history of early CAD -     CT CARDIAC SCORING (SELF PAY ONLY); Future  Lumbar radiculopathy Assessment & Plan: Worsening. Harder to stand longer than 66min-will refer back to Dr. Danielle Dess  Orders: -     Ambulatory referral to Neurosurgery  Contusion of left shoulder, initial encounter  Contusion of left foot, initial encounter  Contusion of right foot, initial encounter  Long term current use of oral hypoglycemic drug  Long-term current use of injectable noninsulin antidiabetic medication  Anxiety Assessment & Plan: Chronic.  Mostly controlled on wellbutrin xl 300mg .  continue  Orders: -     buPROPion HCl ER (XL); Take 1 tablet (300 mg total) by mouth daily.  Dispense: 90 tablet; Refill: 1  Dyslipidemia Assessment & Plan: Chronic.  Continue lipitor 80mg  daily    Wellness-antic guidance.  Rhm utd Chronic problems-see above Contusion L shoulder, B feet-aleve bid, tylenol, ice. Elevate feet.  Monitor.   Return in about 6 months (around 07/11/2023) for HTN, DM, cholesterol.   I,Rachel Rivera,acting as a scribe for Angelena Sole, MD.,have documented all relevant documentation on the behalf of Angelena Sole, MD,as directed by  Angelena Sole, MD while in the  presence of Angelena Sole, MD.  I, Angelena Sole, MD, have reviewed all documentation for this visit. The documentation on 01/08/23 for the exam, diagnosis, procedures, and orders are all accurate and complete.   Angelena Sole, MD

## 2023-01-08 NOTE — Assessment & Plan Note (Signed)
Chronic.  Controlled.  Continue diet/exercise.  Continue ozempic 2mg  weekly, metformin 500mg  daily.  Foot exam done 01/08/23.  Get eye exam done

## 2023-01-08 NOTE — Assessment & Plan Note (Signed)
Pt intol of PO iron.  Check labs.

## 2023-01-08 NOTE — Assessment & Plan Note (Signed)
Chronic.  Continue lipitor 80mg  daily

## 2023-01-08 NOTE — Patient Instructions (Addendum)
It was very nice to see you today!  Referring to Dr. Danielle Dess and for CT calcium score   PLEASE NOTE:  If you had any lab tests please let us know if you have not heard back within a few days. You may see your results on MyChart before we have a chance to review them but we will give you a call once they are reviewed by [place]. If we ordered any referrals today, please let us know if you have not heard from their office within the next week.   Please try these tips to maintain a healthy lifestyle:  Eat most of your calories during the day when you are active. Eliminate processed foods including packaged sweets (pies, cakes, cookies), reduce intake of potatoes, white bread, white pasta, and white rice. Look for whole grain options, oat flour or almond flour.  Each meal should contain half fruits/vegetables, one quarter protein, and one quarter carbs (no bigger than a computer mouse).  Cut down on sweet beverages. This includes juice, soda, and sweet tea. Also watch fruit intake, though this is a healthier sweet option, it still contains natural sugar! Limit to 3 servings daily.  Drink at least 1 glass of water with each meal and aim for at least 8 glasses per day  Exercise at least 150 minutes every week.

## 2023-01-08 NOTE — Assessment & Plan Note (Signed)
Chronic.  Mostly controlled on wellbutrin xl 300mg .  continue

## 2023-01-09 LAB — IRON,TIBC AND FERRITIN PANEL
%SAT: 15 % (calc) — ABNORMAL LOW (ref 20–48)
Ferritin: 26 ng/mL — ABNORMAL LOW (ref 38–380)
TIBC: 415 mcg/dL (calc) (ref 250–425)

## 2023-01-09 NOTE — Progress Notes (Signed)
Sugars and cholesterol looking great.  Lp(a) still pending Iron levels/stores still low-refer to heme as intolerant iron

## 2023-01-19 ENCOUNTER — Other Ambulatory Visit (HOSPITAL_BASED_OUTPATIENT_CLINIC_OR_DEPARTMENT_OTHER): Payer: Self-pay

## 2023-01-20 ENCOUNTER — Other Ambulatory Visit (HOSPITAL_BASED_OUTPATIENT_CLINIC_OR_DEPARTMENT_OTHER): Payer: Self-pay

## 2023-01-20 ENCOUNTER — Encounter: Payer: Self-pay | Admitting: Family Medicine

## 2023-02-18 ENCOUNTER — Ambulatory Visit (HOSPITAL_BASED_OUTPATIENT_CLINIC_OR_DEPARTMENT_OTHER): Payer: Commercial Managed Care - PPO

## 2023-02-28 ENCOUNTER — Ambulatory Visit (HOSPITAL_BASED_OUTPATIENT_CLINIC_OR_DEPARTMENT_OTHER)
Admission: RE | Admit: 2023-02-28 | Discharge: 2023-02-28 | Disposition: A | Discharge status: Home / Self Care | Payer: Commercial Managed Care - PPO | Source: Ambulatory Visit | Attending: Family Medicine | Admitting: Family Medicine

## 2023-02-28 DIAGNOSIS — Z8249 Family history of ischemic heart disease and other diseases of the circulatory system: Secondary | ICD-10-CM | POA: Insufficient documentation

## 2023-02-28 DIAGNOSIS — E119 Type 2 diabetes mellitus without complications: Secondary | ICD-10-CM | POA: Insufficient documentation

## 2023-03-07 ENCOUNTER — Other Ambulatory Visit (HOSPITAL_BASED_OUTPATIENT_CLINIC_OR_DEPARTMENT_OTHER): Payer: Self-pay

## 2023-03-20 DIAGNOSIS — H0288B Meibomian gland dysfunction left eye, upper and lower eyelids: Secondary | ICD-10-CM | POA: Diagnosis not present

## 2023-03-20 DIAGNOSIS — E119 Type 2 diabetes mellitus without complications: Secondary | ICD-10-CM | POA: Diagnosis not present

## 2023-03-20 DIAGNOSIS — H0288A Meibomian gland dysfunction right eye, upper and lower eyelids: Secondary | ICD-10-CM | POA: Diagnosis not present

## 2023-03-20 DIAGNOSIS — H25813 Combined forms of age-related cataract, bilateral: Secondary | ICD-10-CM | POA: Diagnosis not present

## 2023-03-20 DIAGNOSIS — H35033 Hypertensive retinopathy, bilateral: Secondary | ICD-10-CM | POA: Diagnosis not present

## 2023-03-20 DIAGNOSIS — H524 Presbyopia: Secondary | ICD-10-CM | POA: Diagnosis not present

## 2023-03-20 LAB — HM DIABETES EYE EXAM

## 2023-03-24 ENCOUNTER — Other Ambulatory Visit: Payer: Self-pay | Admitting: *Deleted

## 2023-03-24 DIAGNOSIS — R931 Abnormal findings on diagnostic imaging of heart and coronary circulation: Secondary | ICD-10-CM

## 2023-03-24 NOTE — Progress Notes (Signed)
Spoke to pt.  As score in 97%, needs to see card.  Refer [facility]([place] I believe)

## 2023-03-26 DIAGNOSIS — Z6828 Body mass index (BMI) 28.0-28.9, adult: Secondary | ICD-10-CM | POA: Diagnosis not present

## 2023-03-26 DIAGNOSIS — M5416 Radiculopathy, lumbar region: Secondary | ICD-10-CM | POA: Diagnosis not present

## 2023-03-28 ENCOUNTER — Other Ambulatory Visit: Payer: Self-pay | Admitting: Neurological Surgery

## 2023-03-28 DIAGNOSIS — M5416 Radiculopathy, lumbar region: Secondary | ICD-10-CM

## 2023-04-10 ENCOUNTER — Other Ambulatory Visit (HOSPITAL_BASED_OUTPATIENT_CLINIC_OR_DEPARTMENT_OTHER): Payer: Self-pay

## 2023-04-17 ENCOUNTER — Encounter: Payer: Self-pay | Admitting: Family Medicine

## 2023-04-22 ENCOUNTER — Ambulatory Visit
Admission: RE | Admit: 2023-04-22 | Discharge: 2023-04-22 | Disposition: A | Discharge status: Home / Self Care | Payer: Commercial Managed Care - PPO | Source: Ambulatory Visit | Attending: Neurological Surgery | Admitting: Neurological Surgery

## 2023-04-22 DIAGNOSIS — M48061 Spinal stenosis, lumbar region without neurogenic claudication: Secondary | ICD-10-CM | POA: Diagnosis not present

## 2023-04-22 DIAGNOSIS — M5416 Radiculopathy, lumbar region: Secondary | ICD-10-CM

## 2023-04-22 MED ORDER — GADOPICLENOL 0.5 MMOL/ML IV SOLN
10.0000 mL | Freq: Once | INTRAVENOUS | Status: AC | PRN
  Administered 2023-04-22: 10 mL via INTRAVENOUS

## 2023-05-03 ENCOUNTER — Other Ambulatory Visit (HOSPITAL_BASED_OUTPATIENT_CLINIC_OR_DEPARTMENT_OTHER): Payer: Self-pay

## 2023-05-07 ENCOUNTER — Other Ambulatory Visit (HOSPITAL_BASED_OUTPATIENT_CLINIC_OR_DEPARTMENT_OTHER): Payer: Self-pay

## 2023-05-13 IMAGING — CT CT ENTEROGRAPHY (ABD-PELV W/ CM)
2 of 6 series · 16 of 46 positions shown, 18 images · IV contrast (APPLIED)
Comparison: None.

CLINICAL DATA: Iron deficiency anemia.

EXAM:
CT ABDOMEN AND PELVIS WITH CONTRAST (ENTEROGRAPHY)
TECHNIQUE: Multidetector CT of the abdomen and pelvis during bolus
administration of intravenous contrast. Negative oral contrast was
given.

[Series 4: thins · axial · 0.85mm/px · z∈[-450,-23]mm · 13 of 473 slices shown, 15 images]
[im 23/473  soft-tissue]
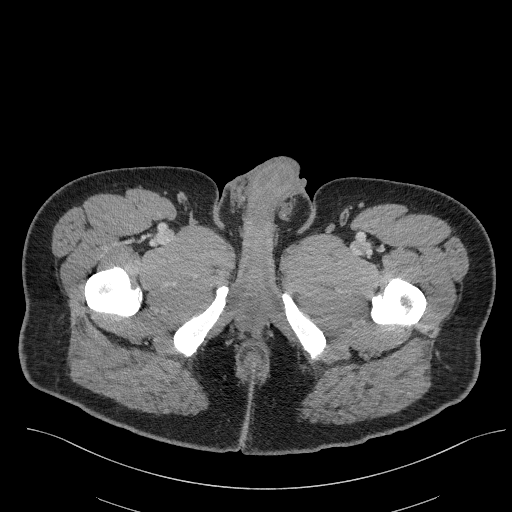
[im 23/473  bone]
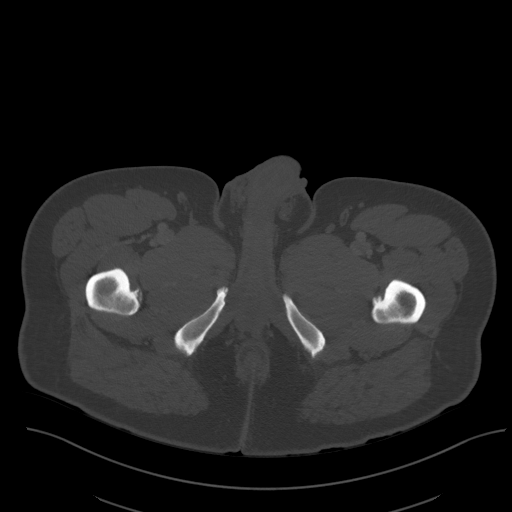
[im 68/473  soft-tissue]
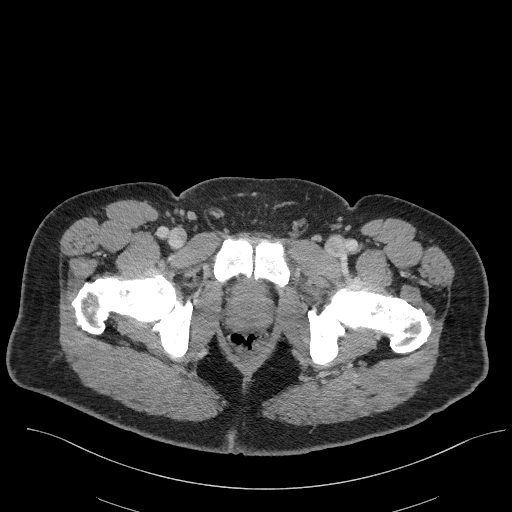
[im 90/473  soft-tissue]
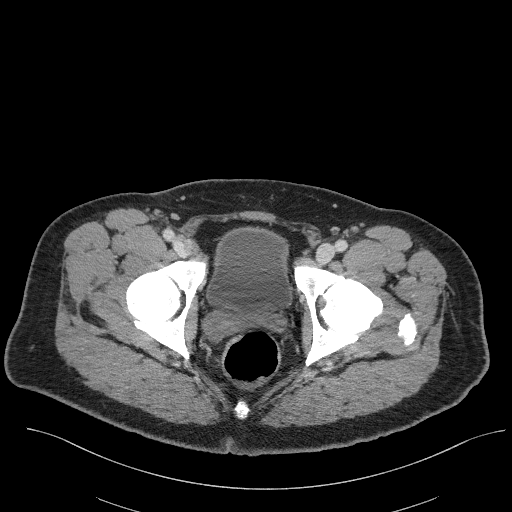
[im 135/473  soft-tissue]
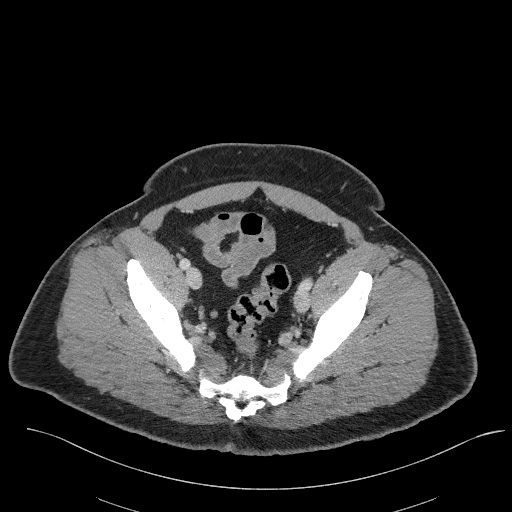
[im 158/473  soft-tissue]
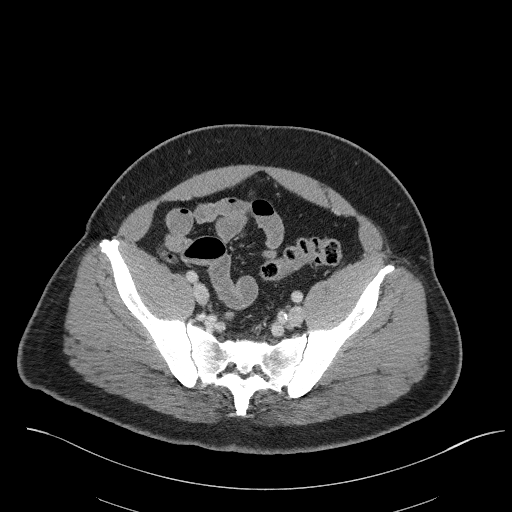
[im 203/473  soft-tissue]
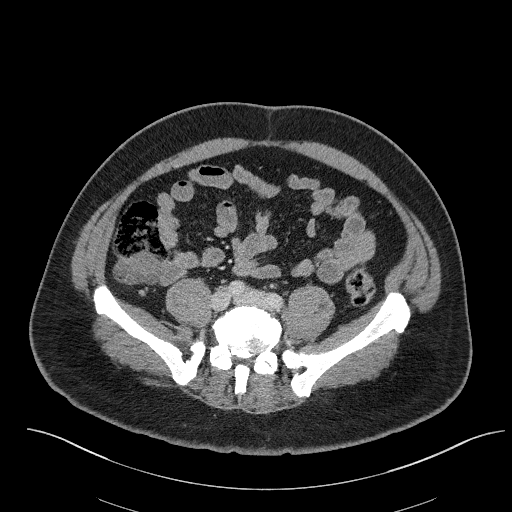
[im 248/473  soft-tissue]
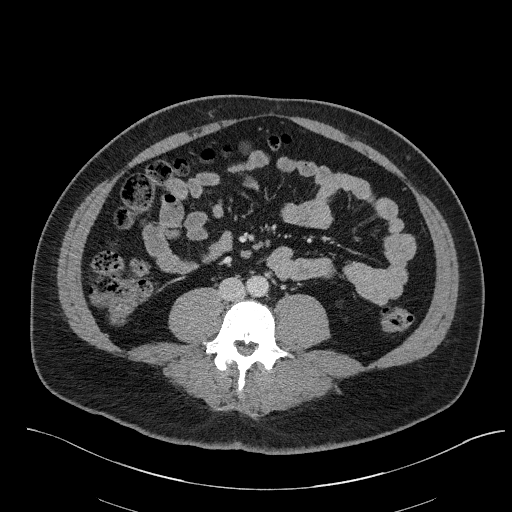
[im 270/473  soft-tissue]
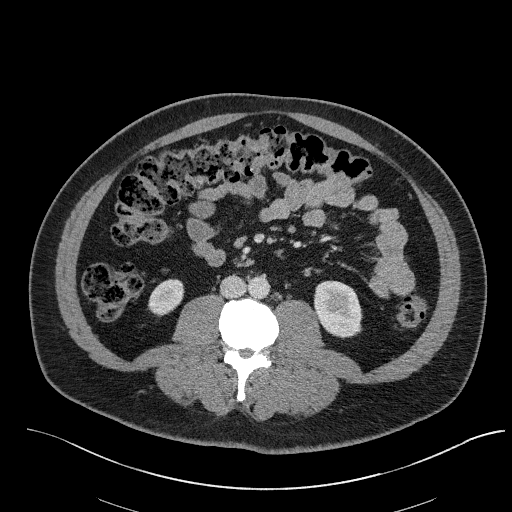
[im 315/473  soft-tissue]
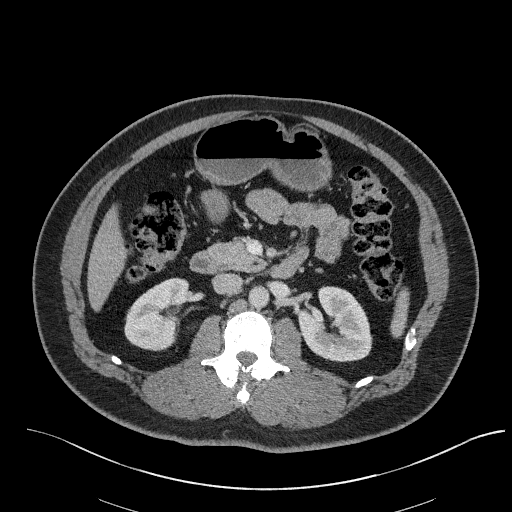
[im 315/473  bone]
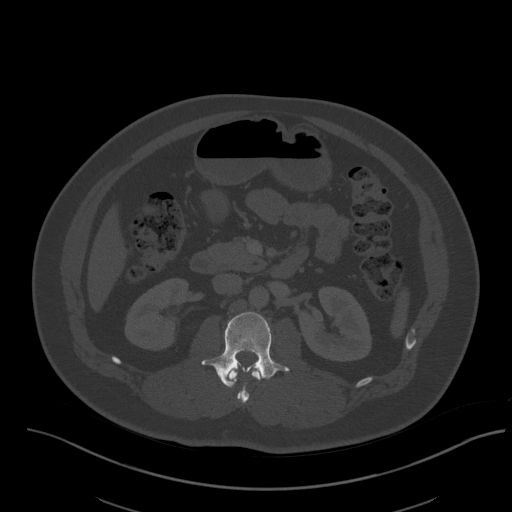
[im 338/473  soft-tissue]
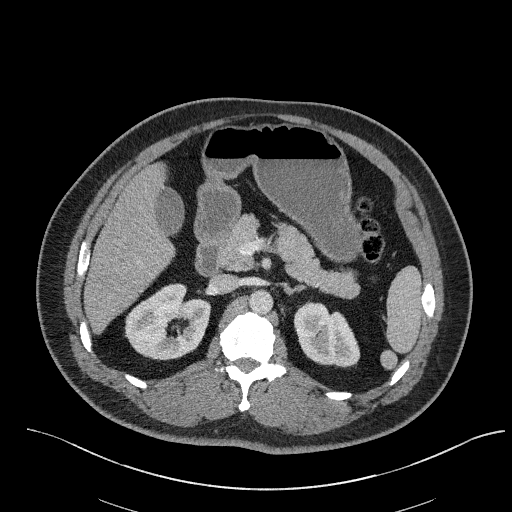
[im 383/473  soft-tissue]
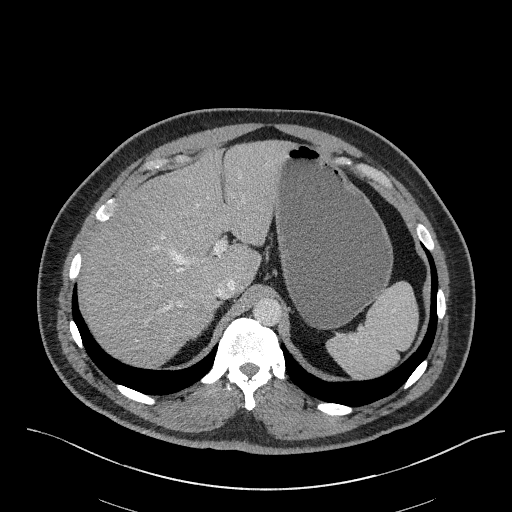
[im 405/473  soft-tissue]
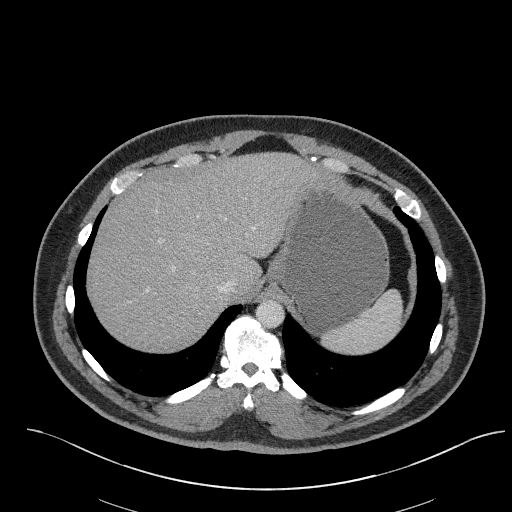
[im 450/473  soft-tissue]
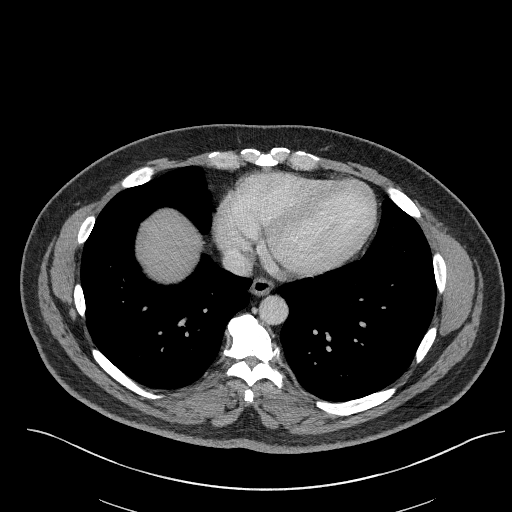

[Series 6: coronal · coronal · 0.95mm/px · 3 of 87 slices shown]
[im 29/87  soft-tissue]
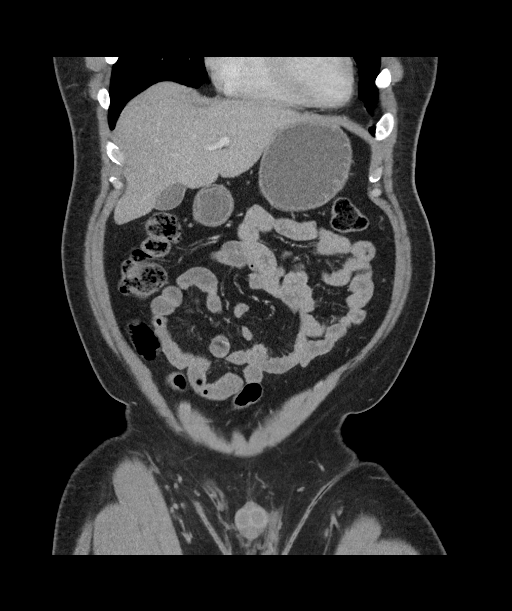
[im 39/87  soft-tissue]
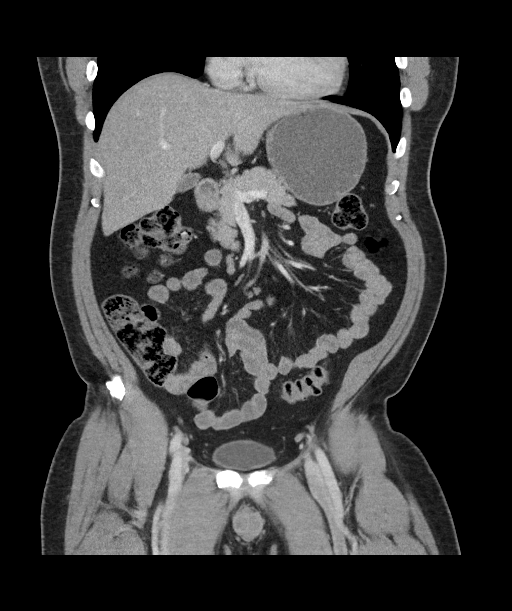
[im 48/87  soft-tissue]
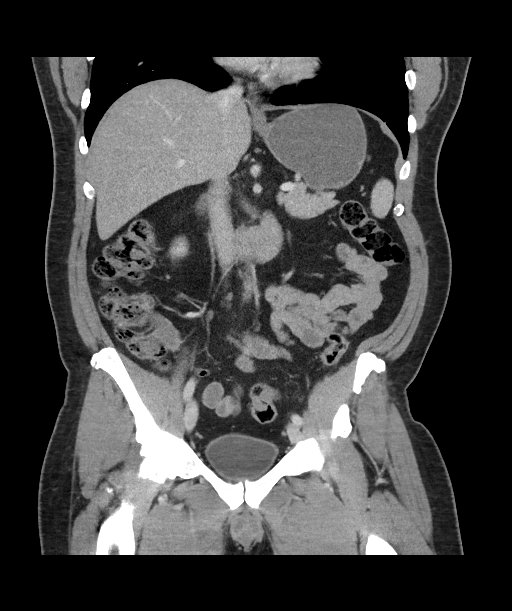

[16 of 46 positions shown; findings below may reference images not displayed]

RADIATION DOSE REDUCTION: This exam was performed according to the
departmental dose-optimization program which includes automated
exposure control, adjustment of the mA and/or kV according to
patient size and/or use of iterative reconstruction technique.

CONTRAST:  100mL OMNIPAQUE IOHEXOL 300 MG/ML  SOLN
FINDINGS: Lower Chest: No acute findings.

Hepatobiliary: No hepatic masses identified. Gallbladder is
unremarkable. No evidence of biliary ductal dilatation.

Pancreas:  No mass or inflammatory changes.

Spleen: Within normal limits in size and appearance.

Adrenals/Urinary Tract: No masses identified. No evidence of
ureteral calculi or hydronephrosis.

Stomach/Bowel: No evidence of bowel wall thickening, abnormal
contrast enhancement, or mesenteric inflammatory changes. No
evidence of stricture or dilatation. No signs of penetrating
disease, fistulae, or abnormal fluid collections. The terminal ileum
is normal in appearance. Normal appendix visualized. A few
diverticula are seen in the proximal sigmoid colon, however there is
no evidence of diverticulitis.

Vascular/Lymphatic: No pathologically enlarged lymph nodes. No acute
vascular findings.

Reproductive:  No mass or other significant abnormality.

Other:  None.

Musculoskeletal:  No suspicious bone lesions identified.
IMPRESSION: No radiographic evidence of inflammatory bowel disease or other
acute findings.

Mild sigmoid diverticulosis. No radiographic evidence of
diverticulitis.

## 2023-05-24 ENCOUNTER — Other Ambulatory Visit: Payer: Self-pay | Admitting: Family Medicine

## 2023-05-25 ENCOUNTER — Other Ambulatory Visit (HOSPITAL_BASED_OUTPATIENT_CLINIC_OR_DEPARTMENT_OTHER): Payer: Self-pay

## 2023-05-25 MED ORDER — METOPROLOL SUCCINATE ER 25 MG PO TB24
25.0000 mg | ORAL_TABLET | Freq: Every day | ORAL | 3 refills | Status: DC
  Filled 2023-05-25 – 2023-06-21 (×3): qty 90, 90d supply, fill #0
  Filled 2023-09-20 (×2): qty 90, 90d supply, fill #1
  Filled 2023-12-27: qty 90, 90d supply, fill #2
  Filled 2024-03-06 – 2024-03-23 (×3): qty 90, 90d supply, fill #3

## 2023-06-02 ENCOUNTER — Telehealth: Payer: Commercial Managed Care - PPO

## 2023-06-02 ENCOUNTER — Other Ambulatory Visit (HOSPITAL_BASED_OUTPATIENT_CLINIC_OR_DEPARTMENT_OTHER): Payer: Self-pay

## 2023-06-02 ENCOUNTER — Telehealth: Payer: Commercial Managed Care - PPO | Admitting: Family Medicine

## 2023-06-02 DIAGNOSIS — M5442 Lumbago with sciatica, left side: Secondary | ICD-10-CM

## 2023-06-02 MED ORDER — NAPROXEN 500 MG PO TABS
500.0000 mg | ORAL_TABLET | Freq: Two times a day (BID) | ORAL | 0 refills | Status: DC
Start: 2023-06-02 — End: 2023-10-10
  Filled 2023-06-02: qty 30, 15d supply, fill #0

## 2023-06-02 MED ORDER — PREDNISONE 10 MG (21) PO TBPK
ORAL_TABLET | ORAL | 0 refills | Status: DC
Start: 2023-06-02 — End: 2023-10-10
  Filled 2023-06-02: qty 21, 6d supply, fill #0

## 2023-06-02 MED ORDER — CYCLOBENZAPRINE HCL 10 MG PO TABS
10.0000 mg | ORAL_TABLET | Freq: Three times a day (TID) | ORAL | 0 refills | Status: DC | PRN
Start: 2023-06-02 — End: 2023-10-10
  Filled 2023-06-02: qty 30, 10d supply, fill #0

## 2023-06-02 NOTE — Progress Notes (Signed)
E-Visit for Back Pain   We are sorry that you are not feeling well.  Here is how we plan to help!  That sounds like a form of stenosis, but I see you just had an MRI. I will try a prednisone dose pack and see if that will help with some muscle relaxers and NSAIDS.  Acute back pain is defined as musculoskeletal pain that can resolve in 1-3 weeks with conservative treatment.  I have prescribed Naprosyn 500 mg take one by mouth twice a day non-steroid anti-inflammatory (NSAID) as well as Flexeril 10 mg every eight hours as needed which is a muscle relaxer  Some patients experience stomach irritation or in increased heartburn with anti-inflammatory drugs.  Please keep in mind that muscle relaxer's can cause fatigue and should not be taken while at work or driving.  Back pain is very common.  The pain often gets better over time.  The cause of back pain is usually not dangerous.  Most people can learn to manage their back pain on their own.  Home Care Stay active.  Start with short walks on flat ground if you can.  Try to walk farther each day. Do not sit, drive or stand in one place for more than 30 minutes.  Do not stay in bed. Do not avoid exercise or work.  Activity can help your back heal faster. Be careful when you bend or lift an object.  Bend at your knees, keep the object close to you, and do not twist. Sleep on a firm mattress.  Lie on your side, and bend your knees.  If you lie on your back, put a pillow under your knees. Only take medicines as told by your doctor. Put ice on the injured area. Put ice in a plastic bag Place a towel between your skin and the bag Leave the ice on for 15-20 minutes, 3-4 times a day for the first 2-3 days. 210 After that, you can switch between ice and heat packs. Ask your doctor about back exercises or massage. Avoid feeling anxious or stressed.  Find good ways to deal with stress, such as exercise.  Get Help Right Way If: Your pain does not go away with  rest or medicine. Your pain does not go away in 1 week. You have new problems. You do not feel well. The pain spreads into your legs. You cannot control when you poop (bowel movement) or pee (urinate) You feel sick to your stomach (nauseous) or throw up (vomit) You have belly (abdominal) pain. You feel like you may pass out (faint). If you develop a fever.  Make Sure you: Understand these instructions. Will watch your condition Will get help right away if you are not doing well or get worse.  Your e-visit answers were reviewed by a board certified advanced clinical practitioner to complete your personal care plan.  Depending on the condition, your plan could have included both over the counter or prescription medications.  If there is a problem please reply  once you have received a response from your provider.  Your safety is important to [place].  If you have drug allergies check your prescription carefully.    You can use MyChart to ask questions about today's visit, request a non-urgent call back, or ask for a work or school excuse for 24 hours related to this e-Visit. If it has been greater than 24 hours you will need to follow up with your provider, or enter a new e-Visit  to address those concerns.  You will get an e-mail in the next two days asking about your experience.  I hope that your e-visit has been valuable and will speed your recovery. Thank you for using e-visits.  I provided 5 minutes of non face-to-face time during this encounter for chart review, medication and order placement, as well as and documentation.

## 2023-06-04 DIAGNOSIS — G8929 Other chronic pain: Secondary | ICD-10-CM | POA: Diagnosis not present

## 2023-06-04 DIAGNOSIS — M546 Pain in thoracic spine: Secondary | ICD-10-CM | POA: Diagnosis not present

## 2023-06-04 DIAGNOSIS — Z6829 Body mass index (BMI) 29.0-29.9, adult: Secondary | ICD-10-CM | POA: Diagnosis not present

## 2023-06-05 ENCOUNTER — Other Ambulatory Visit (HOSPITAL_BASED_OUTPATIENT_CLINIC_OR_DEPARTMENT_OTHER): Payer: Self-pay

## 2023-06-21 ENCOUNTER — Other Ambulatory Visit (HOSPITAL_BASED_OUTPATIENT_CLINIC_OR_DEPARTMENT_OTHER): Payer: Self-pay

## 2023-07-15 ENCOUNTER — Encounter: Payer: Commercial Managed Care - PPO | Admitting: Family Medicine

## 2023-07-19 ENCOUNTER — Other Ambulatory Visit (HOSPITAL_BASED_OUTPATIENT_CLINIC_OR_DEPARTMENT_OTHER): Payer: Self-pay

## 2023-07-24 ENCOUNTER — Other Ambulatory Visit (HOSPITAL_BASED_OUTPATIENT_CLINIC_OR_DEPARTMENT_OTHER): Payer: Self-pay

## 2023-07-24 ENCOUNTER — Other Ambulatory Visit: Payer: Self-pay

## 2023-07-24 DIAGNOSIS — R931 Abnormal findings on diagnostic imaging of heart and coronary circulation: Secondary | ICD-10-CM | POA: Diagnosis not present

## 2023-07-24 DIAGNOSIS — I517 Cardiomegaly: Secondary | ICD-10-CM | POA: Diagnosis not present

## 2023-07-24 DIAGNOSIS — E785 Hyperlipidemia, unspecified: Secondary | ICD-10-CM | POA: Diagnosis not present

## 2023-07-24 DIAGNOSIS — I119 Hypertensive heart disease without heart failure: Secondary | ICD-10-CM | POA: Diagnosis not present

## 2023-07-24 DIAGNOSIS — I444 Left anterior fascicular block: Secondary | ICD-10-CM | POA: Diagnosis not present

## 2023-07-24 MED ORDER — EZETIMIBE 10 MG PO TABS
10.0000 mg | ORAL_TABLET | Freq: Every day | ORAL | 3 refills | Status: DC
  Filled 2023-07-24 (×2): qty 90, 90d supply, fill #0
  Filled 2023-10-13: qty 90, 90d supply, fill #1
  Filled 2024-01-05: qty 90, 90d supply, fill #2
  Filled 2024-04-14: qty 90, 90d supply, fill #3

## 2023-07-26 ENCOUNTER — Other Ambulatory Visit (HOSPITAL_BASED_OUTPATIENT_CLINIC_OR_DEPARTMENT_OTHER): Payer: Self-pay

## 2023-07-30 ENCOUNTER — Other Ambulatory Visit (HOSPITAL_BASED_OUTPATIENT_CLINIC_OR_DEPARTMENT_OTHER): Payer: Self-pay

## 2023-07-30 ENCOUNTER — Other Ambulatory Visit: Payer: Self-pay | Admitting: Family Medicine

## 2023-07-30 DIAGNOSIS — I10 Essential (primary) hypertension: Secondary | ICD-10-CM

## 2023-07-30 DIAGNOSIS — K219 Gastro-esophageal reflux disease without esophagitis: Secondary | ICD-10-CM

## 2023-07-30 MED ORDER — OMEPRAZOLE 40 MG PO CPDR
40.0000 mg | DELAYED_RELEASE_CAPSULE | Freq: Every day | ORAL | 1 refills | Status: DC
  Filled 2023-07-30 (×2): qty 90, 90d supply, fill #0
  Filled 2023-11-15 – 2023-11-26 (×3): qty 90, 90d supply, fill #1

## 2023-07-30 MED ORDER — LOSARTAN POTASSIUM-HCTZ 100-25 MG PO TABS
1.0000 | ORAL_TABLET | Freq: Every day | ORAL | 3 refills | Status: DC
  Filled 2023-07-30: qty 60, 60d supply, fill #0
  Filled 2023-07-30: qty 90, 90d supply, fill #0
  Filled 2023-09-20 (×2): qty 60, 60d supply, fill #1

## 2023-08-05 DIAGNOSIS — R9431 Abnormal electrocardiogram [ECG] [EKG]: Secondary | ICD-10-CM | POA: Diagnosis not present

## 2023-08-16 ENCOUNTER — Other Ambulatory Visit: Payer: Self-pay | Admitting: Family Medicine

## 2023-08-16 ENCOUNTER — Other Ambulatory Visit (HOSPITAL_BASED_OUTPATIENT_CLINIC_OR_DEPARTMENT_OTHER): Payer: Self-pay

## 2023-08-16 DIAGNOSIS — E785 Hyperlipidemia, unspecified: Secondary | ICD-10-CM

## 2023-08-16 MED ORDER — ATORVASTATIN CALCIUM 80 MG PO TABS
80.0000 mg | ORAL_TABLET | Freq: Every day | ORAL | 3 refills | Status: AC
  Filled 2023-08-16: qty 90, 90d supply, fill #0
  Filled 2023-12-06: qty 90, 90d supply, fill #1
  Filled 2024-03-20: qty 90, 90d supply, fill #2
  Filled 2024-06-26: qty 90, 90d supply, fill #3

## 2023-08-19 ENCOUNTER — Encounter: Payer: Commercial Managed Care - PPO | Admitting: Family Medicine

## 2023-09-18 ENCOUNTER — Other Ambulatory Visit: Payer: Self-pay | Admitting: Family Medicine

## 2023-09-18 DIAGNOSIS — I10 Essential (primary) hypertension: Secondary | ICD-10-CM

## 2023-09-19 ENCOUNTER — Other Ambulatory Visit: Payer: Self-pay

## 2023-09-19 ENCOUNTER — Encounter: Payer: Commercial Managed Care - PPO | Admitting: Family Medicine

## 2023-09-19 ENCOUNTER — Other Ambulatory Visit (HOSPITAL_BASED_OUTPATIENT_CLINIC_OR_DEPARTMENT_OTHER): Payer: Self-pay

## 2023-09-19 MED ORDER — OZEMPIC (2 MG/DOSE) 8 MG/3ML ~~LOC~~ SOPN
2.0000 mg | PEN_INJECTOR | SUBCUTANEOUS | 3 refills | Status: DC
  Filled 2023-09-19: qty 9, 84d supply, fill #0
  Filled 2023-11-25 – 2023-12-06 (×4): qty 9, 84d supply, fill #1

## 2023-09-19 MED ORDER — AMLODIPINE BESYLATE 10 MG PO TABS
10.0000 mg | ORAL_TABLET | Freq: Every day | ORAL | 1 refills | Status: DC
  Filled 2023-09-19: qty 90, 90d supply, fill #0

## 2023-09-20 ENCOUNTER — Other Ambulatory Visit (HOSPITAL_BASED_OUTPATIENT_CLINIC_OR_DEPARTMENT_OTHER): Payer: Self-pay

## 2023-10-10 ENCOUNTER — Ambulatory Visit: Admitting: Family Medicine

## 2023-10-10 ENCOUNTER — Other Ambulatory Visit: Payer: Self-pay

## 2023-10-10 ENCOUNTER — Other Ambulatory Visit (HOSPITAL_BASED_OUTPATIENT_CLINIC_OR_DEPARTMENT_OTHER): Payer: Self-pay

## 2023-10-10 ENCOUNTER — Encounter: Payer: Self-pay | Admitting: Family Medicine

## 2023-10-10 VITALS — BP 142/80 | HR 74 | Temp 98.2°F | Resp 18 | Ht 72.0 in | Wt 220.5 lb

## 2023-10-10 DIAGNOSIS — F419 Anxiety disorder, unspecified: Secondary | ICD-10-CM | POA: Diagnosis not present

## 2023-10-10 DIAGNOSIS — E119 Type 2 diabetes mellitus without complications: Secondary | ICD-10-CM

## 2023-10-10 DIAGNOSIS — I10 Essential (primary) hypertension: Secondary | ICD-10-CM | POA: Diagnosis not present

## 2023-10-10 DIAGNOSIS — K219 Gastro-esophageal reflux disease without esophagitis: Secondary | ICD-10-CM

## 2023-10-10 DIAGNOSIS — D509 Iron deficiency anemia, unspecified: Secondary | ICD-10-CM

## 2023-10-10 DIAGNOSIS — E785 Hyperlipidemia, unspecified: Secondary | ICD-10-CM | POA: Diagnosis not present

## 2023-10-10 DIAGNOSIS — Z7984 Long term (current) use of oral hypoglycemic drugs: Secondary | ICD-10-CM | POA: Diagnosis not present

## 2023-10-10 DIAGNOSIS — Z23 Encounter for immunization: Secondary | ICD-10-CM

## 2023-10-10 MED ORDER — VALSARTAN-HYDROCHLOROTHIAZIDE 320-25 MG PO TABS
1.0000 | ORAL_TABLET | Freq: Every day | ORAL | 3 refills | Status: AC
  Filled 2023-10-10: qty 90, 90d supply, fill #0
  Filled 2023-12-27: qty 90, 90d supply, fill #1
  Filled 2024-03-20: qty 90, 90d supply, fill #2
  Filled 2024-06-26: qty 90, 90d supply, fill #3

## 2023-10-10 MED ORDER — AMLODIPINE BESYLATE 10 MG PO TABS
10.0000 mg | ORAL_TABLET | Freq: Every day | ORAL | 3 refills | Status: AC
  Filled 2023-10-10 – 2024-01-24 (×2): qty 90, 90d supply, fill #0
  Filled 2024-04-28: qty 90, 90d supply, fill #1

## 2023-10-10 MED ORDER — AMLODIPINE-VALSARTAN-HCTZ 10-320-25 MG PO TABS
1.0000 | ORAL_TABLET | Freq: Every day | ORAL | 1 refills | Status: DC
  Filled 2023-10-10: qty 90, 90d supply, fill #0

## 2023-10-10 MED ORDER — BUPROPION HCL ER (XL) 300 MG PO TB24
300.0000 mg | ORAL_TABLET | Freq: Every day | ORAL | 1 refills | Status: AC
  Filled 2023-10-10 – 2024-02-07 (×2): qty 90, 90d supply, fill #0
  Filled 2024-05-03: qty 90, 90d supply, fill #1

## 2023-10-10 MED ORDER — METFORMIN HCL 500 MG PO TABS
500.0000 mg | ORAL_TABLET | Freq: Every day | ORAL | 1 refills | Status: DC
  Filled 2023-10-10: qty 90, 90d supply, fill #0

## 2023-10-10 NOTE — Patient Instructions (Signed)

## 2023-10-10 NOTE — Progress Notes (Signed)
 Subjective:     Patient ID: Reginald Kirby., male    DOB: 02-05-1976, 48 y.o.   MRN: [id number]  Chief Complaint  Patient presents with   Medical Management of Chronic Issues    Follow-up on dm, htn Would like a physical since office had to cancel physical    HPI Discussed the use of AI scribe software for clinical note transcription with the patient, who gave verbal consent to proceed.  History of Present Illness Reginald Kirby. is a 48 year old male with hypertension, coronary artery disease, and diabetes who presents for a six-month follow-up visit.  He is concerned about his A1c and microalbumin levels, as he is overdue for these tests. His weight has been stable around 216-217 pounds at home, although he has gained some weight since last year. Currently on ozempic  2mg  weekly, metformin  500mg  daily  Hypertension is controlled at home with readings in the 110s/70s to 120s/80s, but his blood pressure is elevated in the office, likely due to anxiety. He takes amlodipine  10 mg, losartan  HCT 100/25 mg, and metoprolol  25 mg for blood pressure management. He uses a manual cuff at home for blood pressure monitoring, which shows a slight discrepancy compared to the office readings. No ha/cp/palp/cough/sob/edema.  Some dizziness intermitt when stands up.  Lasts few seconds.  Not exercising regularly  He has coronary artery disease with a history of an elevated calcium  score of 108, particularly in the circumflex artery, but no flow-limiting disease. 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 He takes atorvastatin  80 mg, Zetia  10 mg, and aspirin  for cholesterol management and cardiovascular protection.  Diabetes is managed with Ozempic  2 mg and metformin  500 mg once daily. His last A1c was good. He reports that his diet and exercise need improvement, as he has not been  exercising regularly.  He experiences some dizziness when standing quickly, which he attributes to his beta blocker. He reports increased anxiety, possibly related to family stressors, but denies depression. He is currently taking Wellbutrin  for mood management. No SI  He has a family history of cardiac issues, as his mother had a pacemaker placed in her 79s. He recently saw a cardiologist and had an echocardiogram, which was reassuring. He also has a history of DISH syndrome.  His last eye exam was in October, and he is  not up to date with his pneumonia vaccination. He has been fasting today in preparation for lab work. No chest pain, shortness of breath, headaches, dizziness, or gastrointestinal symptoms.  IDA-saw heme-no etiol.  Has been stable.     Health Maintenance Due  Topic Date Due   HEMOGLOBIN A1C  07/11/2023    Past Medical History:  Diagnosis Date   Anxiety    Arthritis    DDD  BACK   Depression    Diabetes mellitus    GERD (gastroesophageal reflux disease)    Headache    HX MIGRAINES    Hyperlipidemia    Hypertension    Sinusitis     Past Surgical History:  Procedure Laterality Date   CARDIAC CATHETERIZATION     2006    OKAY    epidural injections   2002   LUMBAR LAMINECTOMY/DECOMPRESSION MICRODISCECTOMY N/A 10/13/2015   Procedure: L3-4 L4-5 L5-S1 Laminectomy;  Surgeon: Elna Haggis, MD;  Location: [facility];  Service: Neurosurgery;  Laterality: N/A;  L3-4 L4-5 L5-S1 Laminectomy  Current Outpatient Medications:    acetaminophen  (TYLENOL ) 650 MG CR tablet, Take 650 mg by mouth., Disp: , Rfl:    amLODipine  (NORVASC ) 10 MG tablet, Take 1 tablet (10 mg total) by mouth daily., Disp: 90 tablet, Rfl: 1   aspirin  81 MG chewable tablet, Chew 81 mg by mouth., Disp: , Rfl:    atorvastatin  (LIPITOR) 80 MG tablet, Take 1 tablet (80 mg total) by mouth daily., Disp: 90 tablet, Rfl: 3   buPROPion  (WELLBUTRIN  XL) 300 MG 24 hr tablet, Take 1 tablet (300 mg total) by  mouth daily., Disp: 90 tablet, Rfl: 1   ezetimibe  (ZETIA ) 10 MG tablet, Take 1 tablet (10 mg total) by mouth daily., Disp: 90 tablet, Rfl: 3   losartan -hydrochlorothiazide  (HYZAAR ) 100-25 MG tablet, Take 1 tablet by mouth daily., Disp: 90 tablet, Rfl: 3   metFORMIN  (GLUCOPHAGE ) 500 MG tablet, Take 500 mg by mouth., Disp: , Rfl:    metoprolol  succinate (TOPROL -XL) 25 MG 24 hr tablet, Take 1 tablet (25 mg total) by mouth daily., Disp: 90 tablet, Rfl: 3   omeprazole  (PRILOSEC) 40 MG capsule, Take 1 capsule (40 mg total) by mouth daily., Disp: 90 capsule, Rfl: 1   Semaglutide , 2 MG/DOSE, (OZEMPIC , 2 MG/DOSE,) 8 MG/3ML SOPN, Inject 2 mg into the skin once a week., Disp: 9 mL, Rfl: 3  Allergies  Allergen Reactions   Meperidine Hcl Itching   Demerol Itching   ROS neg/noncontributory except as noted HPI/below      Objective:     BP (!) 142/80 (BP Location: Left Arm, Patient Position: Sitting, Cuff Size: Large)   Pulse 74   Temp 98.2 F (36.8 C) (Temporal)   Resp 18   Ht 6' (1.829 m)   Wt 220 lb 8 oz (100 kg)   SpO2 100%   BMI 29.91 kg/m  Wt Readings from Last 3 Encounters:  10/10/23 220 lb 8 oz (100 kg)  01/08/23 213 lb (96.6 kg)  07/23/22 209 lb (94.8 kg)    Physical Exam   Gen: WDWN NAD HEENT: NCAT, conjunctiva not injected, sclera nonicteric NECK:  supple, no thyromegaly, no nodes, no carotid bruits CARDIAC: RRR, S1S2+, no murmur. DP 2+B LUNGS: CTAB. No wheezes ABDOMEN:  BS+, soft, NTND, No HSM, no masses EXT:  no edema MSK: no gross abnormalities.  NEURO: A&O x3.  CN II-XII intact.  PSYCH: normal mood. Good eye contact     Assessment & Plan:  Primary hypertension -     CBC with Differential/Platelet -     COMPLETE METABOLIC PANEL WITHOUT GFR  Dyslipidemia -     COMPLETE METABOLIC PANEL WITHOUT GFR -     Lipid panel  Diabetes mellitus without complication (HCC) -     Hemoglobin A1c -     COMPLETE METABOLIC PANEL WITHOUT GFR -     Vitamin B12 -      Microalbumin / creatinine urine ratio  Anxiety  Gastroesophageal reflux disease without esophagitis  Iron deficiency anemia, unspecified iron deficiency anemia type -     Iron, TIBC and Ferritin Panel  Assessment and Plan Assessment & Plan Hypertensive heart disease   Blood pressure is well-controlled at home with readings in the 110s/70s to 120s/80s, though office and cardiologist visits show elevated readings likely due to anxiety. Echocardiogram reveals mild hypertensive heart disease without significant left ventricular hypertrophy or valve issues. The calcium  score is elevated, particularly in the circumflex artery, but not flow-limiting. change losartan  HCT 100/25 mg to valsartan /hct 320/25 for better  bp control.  Continue amlodipine  10 mg, and metoprolol  25 mg daily. Monitor blood pressure at home and encourage regular exercise to aid in blood pressure control.  Elevated coronary artery calcium  score   The calcium  score is elevated at 102, especially in the circumflex artery, but not flow-limiting. He is on aspirin  and atorvastatin  80mg  and zetia  10mg  for cardiovascular protection. 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 There is a family history of pacemaker placement in the early 59s.  Type 2 diabetes mellitus   Diabetes is managed with Ozempic  2 mg and metformin  500 mg daily. A1c is due for re-evaluation. He is at the maximum dose of Ozempic , which also provides kidney protection. Discussed potential switch to Mounjaro for weight loss, but he prefers to continue with Ozempic  and focus on exercise and diet. Jardiance was considered but deferred pending microalbumin results. Order A1c test, continue Ozempic  2 mg weekly and metformin  500 mg daily, and encourage regular exercise and dietary modifications.  Hyperlipidemia   Hyperlipidemia is managed with  atorvastatin  80 mg and Zetia  10 mg. He is adherent to the medication regimen. Continue atorvastatin  80 mg and Zetia  10 mg daily.  Anxiety   Anxiety likely contributes to elevated blood pressure readings in clinical settings. He reports increased anxiety due to family and life changes. Wellbutrin  is currently used for management, and he does not tolerate SSRIs well. Continue Wellbutrin  and encourage stress management techniques.  Diffuse idiopathic skeletal hyperostosis (DISH)   Diagnosed with DISH syndrome and advised against surgery. Recommended yoga as a non-surgical intervention. Encourage yoga and other non-surgical interventions for symptom management.  General Health Maintenance   He is up to date with eye exams. Pneumonia vaccination is pending. Administer pneumonia vaccination.    Return in about 6 months (around 04/10/2024) for annual physical, chronic follow-up.  Ellsworth Haas, MD

## 2023-10-11 LAB — CBC WITH DIFFERENTIAL/PLATELET
Absolute Lymphocytes: 1832 {cells}/uL (ref 850–3900)
Absolute Monocytes: 288 {cells}/uL (ref 200–950)
Basophils Absolute: 18 {cells}/uL (ref 0–200)
Basophils Relative: 0.4 %
Eosinophils Absolute: 81 {cells}/uL (ref 15–500)
Eosinophils Relative: 1.8 %
HCT: 39.6 % (ref 38.5–50.0)
Hemoglobin: 12.4 g/dL — ABNORMAL LOW (ref 13.2–17.1)
MCH: 22.8 pg — ABNORMAL LOW (ref 27.0–33.0)
MCHC: 31.3 g/dL — ABNORMAL LOW (ref 32.0–36.0)
MCV: 72.7 fL — ABNORMAL LOW (ref 80.0–100.0)
MPV: 9.5 fL (ref 7.5–12.5)
Monocytes Relative: 6.4 %
Neutro Abs: 2282 {cells}/uL (ref 1500–7800)
Neutrophils Relative %: 50.7 %
Platelets: 299 10*3/uL (ref 140–400)
RBC: 5.45 10*6/uL (ref 4.20–5.80)
RDW: 13.2 % (ref 11.0–15.0)
Total Lymphocyte: 40.7 %
WBC: 4.5 10*3/uL (ref 3.8–10.8)

## 2023-10-11 LAB — COMPLETE METABOLIC PANEL WITHOUT GFR
AG Ratio: 1.6 (calc) (ref 1.0–2.5)
ALT: 21 U/L (ref 9–46)
AST: 17 U/L (ref 10–40)
Albumin: 4.5 g/dL (ref 3.6–5.1)
Alkaline phosphatase (APISO): 70 U/L (ref 36–130)
BUN: 12 mg/dL (ref 7–25)
CO2: 32 mmol/L (ref 20–32)
Calcium: 9.9 mg/dL (ref 8.6–10.3)
Chloride: 100 mmol/L (ref 98–110)
Creat: 1.03 mg/dL (ref 0.60–1.29)
Globulin: 2.9 g/dL (ref 1.9–3.7)
Glucose, Bld: 105 mg/dL — ABNORMAL HIGH (ref 65–99)
Potassium: 3.5 mmol/L (ref 3.5–5.3)
Sodium: 140 mmol/L (ref 135–146)
Total Bilirubin: 0.6 mg/dL (ref 0.2–1.2)
Total Protein: 7.4 g/dL (ref 6.1–8.1)

## 2023-10-11 LAB — LIPID PANEL
Cholesterol: 92 mg/dL (ref <200)
HDL: 43 mg/dL (ref >=40)
LDL Cholesterol (Calc): 34 mg/dL
Non-HDL Cholesterol (Calc): 49 mg/dL (ref <130)
Total CHOL/HDL Ratio: 2.1 (calc) (ref <5.0)
Triglycerides: 73 mg/dL (ref <150)

## 2023-10-11 LAB — VITAMIN B12: Vitamin B-12: 616 pg/mL (ref 200–1100)

## 2023-10-11 LAB — MICROALBUMIN / CREATININE URINE RATIO
Creatinine, Urine: 53 mg/dL (ref 20–320)
Microalb Creat Ratio: 25 mg/g{creat} (ref <30)
Microalb, Ur: 1.3 mg/dL

## 2023-10-11 LAB — HEMOGLOBIN A1C
Hgb A1c MFr Bld: 7.1 % — ABNORMAL HIGH (ref <5.7)
Mean Plasma Glucose: 157 mg/dL
eAG (mmol/L): 8.7 mmol/L

## 2023-10-11 LAB — IRON,TIBC AND FERRITIN PANEL
%SAT: 23 % (ref 20–48)
Ferritin: 12 ng/mL — ABNORMAL LOW (ref 38–380)
Iron: 108 ug/dL (ref 50–180)
TIBC: 472 ug/dL — ABNORMAL HIGH (ref 250–425)

## 2023-10-12 ENCOUNTER — Encounter: Payer: Self-pay | Admitting: Family Medicine

## 2023-10-12 NOTE — Progress Notes (Signed)
 Labs great except:  iron stores have decreased.  He may want to touch base with heme 2.  A1C has increased to 7-needs to get on track w/diet/exercise.  Does he want to increase metformin  to bid?  And/or add Jardiance 10mg  daily #90/1 if yes but needs to repeat bmp in 1 month.   And should reck A1c in 3 months

## 2023-10-13 ENCOUNTER — Other Ambulatory Visit: Payer: Self-pay

## 2023-10-13 ENCOUNTER — Encounter: Payer: Self-pay | Admitting: *Deleted

## 2023-10-13 ENCOUNTER — Other Ambulatory Visit: Payer: Self-pay | Admitting: *Deleted

## 2023-10-13 ENCOUNTER — Other Ambulatory Visit (HOSPITAL_BASED_OUTPATIENT_CLINIC_OR_DEPARTMENT_OTHER): Payer: Self-pay

## 2023-10-13 DIAGNOSIS — E119 Type 2 diabetes mellitus without complications: Secondary | ICD-10-CM

## 2023-10-13 DIAGNOSIS — I10 Essential (primary) hypertension: Secondary | ICD-10-CM

## 2023-10-13 MED ORDER — EMPAGLIFLOZIN 10 MG PO TABS
10.0000 mg | ORAL_TABLET | Freq: Every day | ORAL | 1 refills | Status: DC
  Filled 2023-10-13 (×3): qty 90, 90d supply, fill #0
  Filled 2023-11-26 – 2023-12-27 (×2): qty 90, 90d supply, fill #1

## 2023-10-13 NOTE — Addendum Note (Signed)
 Addended by: Albertine Alpha on: 10/13/2023 04:59 PM   Modules accepted: Orders

## 2023-11-15 ENCOUNTER — Other Ambulatory Visit (HOSPITAL_BASED_OUTPATIENT_CLINIC_OR_DEPARTMENT_OTHER): Payer: Self-pay

## 2023-11-25 ENCOUNTER — Other Ambulatory Visit (HOSPITAL_BASED_OUTPATIENT_CLINIC_OR_DEPARTMENT_OTHER): Payer: Self-pay

## 2023-11-26 ENCOUNTER — Other Ambulatory Visit (HOSPITAL_BASED_OUTPATIENT_CLINIC_OR_DEPARTMENT_OTHER): Payer: Self-pay

## 2023-11-28 ENCOUNTER — Other Ambulatory Visit (HOSPITAL_BASED_OUTPATIENT_CLINIC_OR_DEPARTMENT_OTHER): Payer: Self-pay

## 2023-12-01 ENCOUNTER — Ambulatory Visit: Payer: Self-pay | Admitting: Family Medicine

## 2023-12-01 ENCOUNTER — Other Ambulatory Visit (INDEPENDENT_AMBULATORY_CARE_PROVIDER_SITE_OTHER)

## 2023-12-01 DIAGNOSIS — E119 Type 2 diabetes mellitus without complications: Secondary | ICD-10-CM | POA: Diagnosis not present

## 2023-12-01 DIAGNOSIS — I10 Essential (primary) hypertension: Secondary | ICD-10-CM | POA: Diagnosis not present

## 2023-12-01 LAB — BASIC METABOLIC PANEL WITH GFR
BUN: 11 mg/dL (ref 6–23)
CO2: 31 meq/L (ref 19–32)
Calcium: 10.1 mg/dL (ref 8.4–10.5)
Chloride: 100 meq/L (ref 96–112)
Creatinine, Ser: 0.95 mg/dL (ref 0.40–1.50)
GFR: 95.16 mL/min (ref >60.00)
Glucose, Bld: 114 mg/dL — ABNORMAL HIGH (ref 70–99)
Potassium: 3.8 meq/L (ref 3.5–5.1)
Sodium: 140 meq/L (ref 135–145)

## 2023-12-01 NOTE — Progress Notes (Signed)
 Kidney function stable.  Continue Jardiance 

## 2023-12-08 ENCOUNTER — Other Ambulatory Visit (HOSPITAL_BASED_OUTPATIENT_CLINIC_OR_DEPARTMENT_OTHER): Payer: Self-pay

## 2023-12-30 ENCOUNTER — Telehealth: Admitting: Physician Assistant

## 2023-12-30 DIAGNOSIS — B029 Zoster without complications: Secondary | ICD-10-CM

## 2023-12-30 MED ORDER — VALACYCLOVIR HCL 1 G PO TABS
1000.0000 mg | ORAL_TABLET | Freq: Three times a day (TID) | ORAL | 0 refills | Status: DC
  Filled 2023-12-30: qty 30, 10d supply, fill #0

## 2023-12-30 NOTE — Progress Notes (Signed)
E-visit for Shingles   We are sorry that you are not feeling well. Here is how we plan to help!  Based on what you shared with me it looks like you have shingles.  Shingles or herpes zoster, is a common infection of the nerves.  It is a painful rash caused by the herpes zoster virus.  This is the same virus that causes chickenpox.  After a person has chickenpox, the virus remains inactive in the nerve cells.  Years later, the virus can become active again and travel to the skin.  It typically will appear on one side of the face or body.  Burning or shooting pain, tingling, or itching are early signs of the infection.  Blisters typically scab over in 7 to 10 days and clear up within 2-4 weeks. Shingles is only contagious to people that have never had the chickenpox, the chickenpox vaccine, or anyone who has a compromised immune system.  You should avoid contact with these type of people until your blisters scab over.  I have prescribed Valacyclovir 1g three times daily for 7 days and also Gabapentin 300mg twice daily as needed for pain   HOME CARE: Apply ice packs (wrapped in a thin towel), cool compresses, or soak in cool bath to help reduce pain. Use calamine lotion to calm itchy skin. Avoid scratching the rash. Avoid direct sunlight.  GET HELP RIGHT AWAY IF: Symptoms that don't away after treatment. A rash or blisters near your eye. Increased drainage, fever, or rash after treatment. Severe pain that doesn't go away.   MAKE SURE YOU   Understand these instructions. Will watch your condition. Will get help right away if you are not doing well or get worse.  Thank you for choosing an e-visit.  Your e-visit answers were reviewed by a board certified advanced clinical practitioner to complete your personal care plan. Depending upon the condition, your plan could have included both over the counter or prescription medications.  Please review your pharmacy choice. Make sure the pharmacy  is open so you can pick up prescription now. If there is a problem, you may contact your provider through MyChart messaging and have the prescription routed to another pharmacy.  Your safety is important to us. If you have drug allergies check your prescription carefully.   For the next 24 hours you can use MyChart to ask questions about today's visit, request a non-urgent call back, or ask for a work or school excuse. You will get an email in the next two days asking about your experience. I hope that your e-visit has been valuable and will speed your recovery.  Approximately 5 minutes was spent documenting and reviewing patient's chart.   

## 2023-12-31 ENCOUNTER — Other Ambulatory Visit (HOSPITAL_BASED_OUTPATIENT_CLINIC_OR_DEPARTMENT_OTHER): Payer: Self-pay

## 2023-12-31 MED ORDER — GABAPENTIN 300 MG PO CAPS
300.0000 mg | ORAL_CAPSULE | Freq: Two times a day (BID) | ORAL | 0 refills | Status: DC
Start: 2023-12-31 — End: 2024-01-26
  Filled 2023-12-31: qty 30, 15d supply, fill #0

## 2023-12-31 MED ORDER — VALACYCLOVIR HCL 1 G PO TABS
1000.0000 mg | ORAL_TABLET | Freq: Three times a day (TID) | ORAL | 0 refills | Status: AC
  Filled 2023-12-31 (×2): qty 21, 7d supply, fill #0

## 2023-12-31 NOTE — Addendum Note (Signed)
 Addended by: GLADIS ELSIE BROCKS on: 12/31/2023 10:41 AM   Modules accepted: Orders

## 2024-01-23 ENCOUNTER — Encounter: Payer: Self-pay | Admitting: Family Medicine

## 2024-01-23 NOTE — Telephone Encounter (Signed)
 Had e-visit 12/30/23, dx with Shingles, Gabapentin  prescribed.  OK to refill?  Rx pending.

## 2024-01-24 ENCOUNTER — Other Ambulatory Visit (HOSPITAL_BASED_OUTPATIENT_CLINIC_OR_DEPARTMENT_OTHER): Payer: Self-pay

## 2024-01-26 ENCOUNTER — Other Ambulatory Visit (HOSPITAL_BASED_OUTPATIENT_CLINIC_OR_DEPARTMENT_OTHER): Payer: Self-pay

## 2024-01-26 ENCOUNTER — Other Ambulatory Visit: Payer: Self-pay | Admitting: Family Medicine

## 2024-01-26 ENCOUNTER — Encounter: Payer: Self-pay | Admitting: Family Medicine

## 2024-01-26 MED ORDER — GABAPENTIN 300 MG PO CAPS
300.0000 mg | ORAL_CAPSULE | Freq: Two times a day (BID) | ORAL | 1 refills | Status: DC
  Filled 2024-01-26: qty 60, 30d supply, fill #0

## 2024-01-28 ENCOUNTER — Other Ambulatory Visit (HOSPITAL_BASED_OUTPATIENT_CLINIC_OR_DEPARTMENT_OTHER): Payer: Self-pay

## 2024-01-28 ENCOUNTER — Other Ambulatory Visit: Payer: Self-pay | Admitting: Family Medicine

## 2024-01-28 MED ORDER — TIRZEPATIDE 7.5 MG/0.5ML ~~LOC~~ SOAJ
7.5000 mg | SUBCUTANEOUS | 1 refills | Status: DC
  Filled 2024-01-28 (×2): qty 2, 28d supply, fill #0

## 2024-02-07 ENCOUNTER — Other Ambulatory Visit: Payer: Self-pay | Admitting: Family Medicine

## 2024-02-07 DIAGNOSIS — K219 Gastro-esophageal reflux disease without esophagitis: Secondary | ICD-10-CM

## 2024-02-08 ENCOUNTER — Other Ambulatory Visit (HOSPITAL_BASED_OUTPATIENT_CLINIC_OR_DEPARTMENT_OTHER): Payer: Self-pay

## 2024-02-08 MED ORDER — OMEPRAZOLE 40 MG PO CPDR
40.0000 mg | DELAYED_RELEASE_CAPSULE | Freq: Every day | ORAL | 1 refills | Status: AC
  Filled 2024-02-08: qty 90, 90d supply, fill #0
  Filled 2024-05-15: qty 90, 90d supply, fill #1

## 2024-02-13 ENCOUNTER — Other Ambulatory Visit (HOSPITAL_BASED_OUTPATIENT_CLINIC_OR_DEPARTMENT_OTHER): Payer: Self-pay

## 2024-02-24 ENCOUNTER — Ambulatory Visit: Admitting: Family Medicine

## 2024-02-24 ENCOUNTER — Other Ambulatory Visit (HOSPITAL_BASED_OUTPATIENT_CLINIC_OR_DEPARTMENT_OTHER): Payer: Self-pay

## 2024-02-24 ENCOUNTER — Encounter: Payer: Self-pay | Admitting: Family Medicine

## 2024-02-24 ENCOUNTER — Ambulatory Visit: Payer: Self-pay | Admitting: Family Medicine

## 2024-02-24 VITALS — BP 128/74 | HR 78 | Temp 98.1°F | Resp 18 | Ht 72.0 in | Wt 213.1 lb

## 2024-02-24 DIAGNOSIS — E119 Type 2 diabetes mellitus without complications: Secondary | ICD-10-CM

## 2024-02-24 DIAGNOSIS — Z0000 Encounter for general adult medical examination without abnormal findings: Secondary | ICD-10-CM | POA: Diagnosis not present

## 2024-02-24 DIAGNOSIS — E785 Hyperlipidemia, unspecified: Secondary | ICD-10-CM | POA: Diagnosis not present

## 2024-02-24 DIAGNOSIS — I10 Essential (primary) hypertension: Secondary | ICD-10-CM

## 2024-02-24 DIAGNOSIS — Z7985 Long-term (current) use of injectable non-insulin antidiabetic drugs: Secondary | ICD-10-CM

## 2024-02-24 DIAGNOSIS — Z7984 Long term (current) use of oral hypoglycemic drugs: Secondary | ICD-10-CM | POA: Diagnosis not present

## 2024-02-24 DIAGNOSIS — Z125 Encounter for screening for malignant neoplasm of prostate: Secondary | ICD-10-CM | POA: Diagnosis not present

## 2024-02-24 LAB — TSH: TSH: 1.28 u[IU]/mL (ref 0.35–5.50)

## 2024-02-24 LAB — CBC WITH DIFFERENTIAL/PLATELET
Basophils Absolute: 0 K/uL (ref 0.0–0.1)
Basophils Relative: 0.5 % (ref 0.0–3.0)
Eosinophils Absolute: 0.1 K/uL (ref 0.0–0.7)
Eosinophils Relative: 1.3 % (ref 0.0–5.0)
HCT: 41.5 % (ref 39.0–52.0)
Hemoglobin: 13 g/dL (ref 13.0–17.0)
Lymphocytes Relative: 38.8 % (ref 12.0–46.0)
Lymphs Abs: 1.6 K/uL (ref 0.7–4.0)
MCHC: 31.2 g/dL (ref 30.0–36.0)
MCV: 70 fl — ABNORMAL LOW (ref 78.0–100.0)
Monocytes Absolute: 0.3 K/uL (ref 0.1–1.0)
Monocytes Relative: 6 % (ref 3.0–12.0)
Neutro Abs: 2.2 K/uL (ref 1.4–7.7)
Neutrophils Relative %: 53.4 % (ref 43.0–77.0)
Platelets: 323 K/uL (ref 150.0–400.0)
RBC: 5.93 Mil/uL — ABNORMAL HIGH (ref 4.22–5.81)
RDW: 15.5 % (ref 11.5–15.5)
WBC: 4.2 K/uL (ref 4.0–10.5)

## 2024-02-24 LAB — LIPID PANEL
Cholesterol: 91 mg/dL (ref 0–200)
HDL: 41.3 mg/dL (ref >39.00)
LDL Cholesterol: 35 mg/dL (ref 0–99)
NonHDL: 49.62
Total CHOL/HDL Ratio: 2
Triglycerides: 72 mg/dL (ref 0.0–149.0)
VLDL: 14.4 mg/dL (ref 0.0–40.0)

## 2024-02-24 LAB — COMPREHENSIVE METABOLIC PANEL WITH GFR
ALT: 26 U/L (ref 0–53)
AST: 21 U/L (ref 0–37)
Albumin: 4.7 g/dL (ref 3.5–5.2)
Alkaline Phosphatase: 70 U/L (ref 39–117)
BUN: 15 mg/dL (ref 6–23)
CO2: 32 meq/L (ref 19–32)
Calcium: 10.2 mg/dL (ref 8.4–10.5)
Chloride: 100 meq/L (ref 96–112)
Creatinine, Ser: 1.11 mg/dL (ref 0.40–1.50)
GFR: 78.82 mL/min (ref >60.00)
Glucose, Bld: 124 mg/dL — ABNORMAL HIGH (ref 70–99)
Potassium: 4.1 meq/L (ref 3.5–5.1)
Sodium: 143 meq/L (ref 135–145)
Total Bilirubin: 0.4 mg/dL (ref 0.2–1.2)
Total Protein: 7.5 g/dL (ref 6.0–8.3)

## 2024-02-24 LAB — HEMOGLOBIN A1C: Hgb A1c MFr Bld: 7.4 % — ABNORMAL HIGH (ref 4.6–6.5)

## 2024-02-24 LAB — PSA: PSA: 0.32 ng/mL (ref 0.10–4.00)

## 2024-02-24 MED ORDER — EMPAGLIFLOZIN 10 MG PO TABS
10.0000 mg | ORAL_TABLET | Freq: Every day | ORAL | 1 refills | Status: AC
  Filled 2024-02-24 – 2024-03-27 (×4): qty 90, 90d supply, fill #0
  Filled 2024-06-26: qty 90, 90d supply, fill #1
  Filled 2024-06-29: qty 90, 90d supply, fill #0

## 2024-02-24 MED ORDER — TIRZEPATIDE 7.5 MG/0.5ML ~~LOC~~ SOAJ
7.5000 mg | SUBCUTANEOUS | 1 refills | Status: AC
  Filled 2024-02-24: qty 6, 84d supply, fill #0
  Filled 2024-04-28 – 2024-05-04 (×2): qty 6, 84d supply, fill #1

## 2024-02-24 NOTE — Progress Notes (Signed)
 Labs great except  A1C worse-of course, get back to exercise but restart the metformin  500mg  daily

## 2024-02-24 NOTE — Progress Notes (Signed)
 Phone: [phone]   Subjective:  Patient 48 y.o. male presenting for annual physical.  Chief Complaint  Patient presents with   Annual Exam    CPE Fasting   1  annual-not exercises Discussed the use of AI scribe software for clinical note transcription with the patient, who gave verbal consent to proceed.  History of Present Illness Reginald Kirby. is a 48 year old male who presents for an annual physical exam.  He recently switched his diabetes medication from Ozempic  to Mounjaro , resulting in a 7-pound weight loss over the past month. He notes improved appetite control and some nausea with Mounjaro . He is not currently taking metformin , although it remains on his medication list, and he is taking Jardiance .  For cholesterol management, he is on atorvastatin  80 mg and Zetia  10 mg. A calcium  score indicated some accumulation in the LAD, prompting the addition of Zetia . He recalls that an echocardiogram showed some heart changes related to high blood pressure with a normal ejection fraction, and that an EKG showed a new left bundle branch block and left anterior fascicular block.  His blood pressure is well-controlled with metoprolol  XL 25 mg and valsartan  HCT 320/25 mg, with home readings as low as 118/60 mmHg. No headaches, dizziness, or chest pain are reported.  He experiences constipation and nausea, which he attributes to medication. He manages constipation with fiber gummies and increased fluid intake, using a low-sodium, low-sugar hydration product called Cure.  He reports shoulder pain, particularly on the right side, with limited range of motion and cracking sounds. He attributes this to side sleeping and has tried using a cervical pillow. He has a history of bursitis treated with injections in hips and will f/u sports med.  He had a recent episode of shingles in early August, with pain persisting in the R hip and leg. Gabapentin  300 mg is used as needed for pain  relief.  No major headaches, dizziness, passing out, blurry vision, double vision, sore throat, hoarseness, trouble swallowing, chest pains, heart racing, skipping beats, coughing, wheezing, or shortness of breath are reported. He has mild seasonal allergies.  He is starting a new job in the [employer] and hopes for better work-life balance. He occasionally walks with his wife, Mitzie, but does not have a regular exercise routine.    See problem oriented charting- ROS- ROS: Gen: no fever, chills  Skin: no rash, itching ENT: no ear pain, ear drainage, nasal congestion, rhinorrhea, sinus pressure, sore throat.  Some allergies Eyes: no blurry vision, double vision Resp: no cough, wheeze,SOB CV: no CP, palpitations, LE edema,  GI: no heartburn, v/d, abd pain GU: no dysuria, urgency, frequency, hematuria MSK: more joint pains Neuro: no dizziness, headache, weakness, vertigo Psych: no depression, anxiety, insomnia, SI - doing well  The following were reviewed and entered/updated in epic: Past Medical History:  Diagnosis Date   Allergy Unkown   Demerol   Anxiety    Arthritis    DDD  BACK   Depression Unknown   Diabetes mellitus    GERD (gastroesophageal reflux disease)    Headache    HX MIGRAINES    Hyperlipidemia    Hypertension    Sinusitis    Patient Active Problem List   Diagnosis Date Noted   Anxiety 01/08/2023   Iron deficiency anemia 05/16/2021   Gastroesophageal reflux disease without esophagitis 05/16/2021   Gout 09/15/2018   Insomnia 05/13/2018   Lumbar radiculopathy 10/13/2015   Family history of colon cancer 07/06/2015  Diabetes mellitus without complication (HCC) 04/03/2015   Lumbar pain 01/21/2015   Dyslipidemia 10/23/2011   Hypertension 10/23/2011   Past Surgical History:  Procedure Laterality Date   CARDIAC CATHETERIZATION     2006    OKAY    epidural injections   06/17/2000   LUMBAR LAMINECTOMY/DECOMPRESSION MICRODISCECTOMY N/A 10/13/2015    Procedure: L3-4 L4-5 L5-S1 Laminectomy;  Surgeon: Victory Gens, MD;  Location: [facility];  Service: Neurosurgery;  Laterality: N/A;  L3-4 L4-5 L5-S1 Laminectomy   SPINE SURGERY  10/12/15    Family History  Problem Relation Age of Onset   Heart disease Mother        has pacemaker   Arthritis Mother    Cancer Mother    Hypertension Mother    Colon cancer Father 68   Alcohol abuse Father    Anxiety disorder Father    Cancer Father    Depression Father    Diabetes Brother    Heart disease Brother    Hypertension Brother    Diabetes Sister    Diabetes Brother    Heart disease Brother    Hypertension Brother     Medications- reviewed and updated Current Outpatient Medications  Medication Sig Dispense Refill   amLODipine  (NORVASC ) 10 MG tablet Take 1 tablet (10 mg total) by mouth daily. 90 tablet 3   atorvastatin  (LIPITOR) 80 MG tablet Take 1 tablet (80 mg total) by mouth daily. 90 tablet 3   buPROPion  (WELLBUTRIN  XL) 300 MG 24 hr tablet Take 1 tablet (300 mg total) by mouth daily. 90 tablet 1   empagliflozin  (JARDIANCE ) 10 MG TABS tablet Take 1 tablet (10 mg total) by mouth daily before breakfast. 90 tablet 1   ezetimibe  (ZETIA ) 10 MG tablet Take 1 tablet (10 mg total) by mouth daily. 90 tablet 3   gabapentin  (NEURONTIN ) 300 MG capsule Take 1 capsule (300 mg total) by mouth 2 (two) times daily. 60 capsule 1   metoprolol  succinate (TOPROL -XL) 25 MG 24 hr tablet Take 1 tablet (25 mg total) by mouth daily. 90 tablet 3   omeprazole  (PRILOSEC) 40 MG capsule Take 1 capsule (40 mg total) by mouth daily. 90 capsule 1   tirzepatide  (MOUNJARO ) 7.5 MG/0.5ML Pen Inject 7.5 mg into the skin once a week. 2 mL 1   valsartan -hydrochlorothiazide  (DIOVAN -HCT) 320-25 MG tablet Take 1 tablet by mouth daily. 90 tablet 3   No current facility-administered medications for this visit.    Allergies-reviewed and updated Allergies  Allergen Reactions   Meperidine Hcl Itching   Demerol Itching     Social History   Social History Narrative   Not on file   Objective  Objective:  BP 128/74   Pulse 78   Temp 98.1 F (36.7 C) (Temporal)   Resp 18   Ht 6' (1.829 m)   Wt 213 lb 2 oz (96.7 kg)   SpO2 99%   BMI 28.90 kg/m  Physical Exam  Gen: WDWN NAD HEENT: NCAT, conjunctiva not injected, sclera nonicteric TM WNL B, OP moist, no exudates  NECK:  supple, no thyromegaly, no nodes, no carotid bruits CARDIAC: RRR, S1S2+, no murmur. DP 2+B LUNGS: CTAB. No wheezes ABDOMEN:  BS+, soft, NTND, No HSM, no masses EXT:  no edema MSK: no gross abnormalities. MS 5/5 all 4 NEURO: A&O x3.  CN II-XII intact.  PSYCH: normal mood. Good eye contact     Assessment and Plan   Health Maintenance counseling: 1. Anticipatory guidance: Patient counseled regarding regular dental  exams q6 months, eye exams yearly, avoiding smoking and second hand smoke, limiting alcohol to 2 beverages per day.   2. Risk factor reduction:  Advised patient of need for regular exercise and diet rich in fruits and vegetables to reduce risk of heart attack and stroke. Exercise- +.   Wt Readings from Last 3 Encounters:  02/24/24 213 lb 2 oz (96.7 kg)  10/10/23 220 lb 8 oz (100 kg)  01/08/23 213 lb (96.6 kg)   3. Immunizations/screenings/ancillary studies Immunization History  Administered Date(s) Administered   Influenza,inj,Quad PF,6+ Mos 03/25/2022   Influenza-Unspecified 04/18/2014, 03/06/2015, 03/07/2017, 01/25/2019, 03/31/2020, 02/15/2021, 04/09/2023   PFIZER(Purple Top)SARS-COV-2 Vaccination 06/16/2019, 07/06/2019, 04/07/2020   PNEUMOCOCCAL CONJUGATE-20 10/10/2023   Pfizer Covid-19 Vaccine Bivalent Booster 29yrs & up 03/09/2021   Tdap 07/06/2015   Health Maintenance Due  Topic Date Due   Hepatitis B Vaccines 19-59 Average Risk (1 of 3 - 19+ 3-dose series) Never done   FOOT EXAM  01/08/2024   Influenza Vaccine  01/16/2024    4. Skin cancer screening- Iadvised regular sunscreen use. Denies worrisome,  changing, or new skin lesions.  5. Smoking associated screening: non smoker  Wellness examination  Screening for prostate cancer  Diabetes mellitus without complication (HCC)  Primary hypertension  Dyslipidemia   Wellness-anticipatory guidance.  Work on Diet/Exercise  Check CBC,CMP,lipids,TSH, A1C.  F/u 1 yr  Assessment and Plan Assessment & Plan Adult Wellness Visit   Routine adult wellness visit with no new surgeries or family history updates. He has lost 7 pounds, likely due to switching from Ozempic  to Mounjaro . Encourage regular exercise and consider joining [employer] for structured classes. Maintain a balanced work-life schedule with the new job.  Type 2 diabetes mellitus   Type 2 diabetes is managed with Mounjaro  and Jardiance . Mounjaro  has improved appetite control and resulted in weight loss. The current dose of 7.5 mg is effective, though it causes mild morning nausea. Continue Mounjaro  at 7.5 mg and Jardiance . Monitor A1c and adjust treatment as necessary.  Essential hypertension and hypertensive heart disease   Hypertension is well-controlled with metoprolol  XL and valsartan  HCT. A recent cardiology evaluation revealed hypertensive heart disease with normal ejection fraction and a new left bundle branch block. The calcium  score showed accumulation in the LAD. Continue metoprolol  XL 25 mg and valsartan  HCT 320/25 mg. Follow up with cardiology as needed.  Hyperlipidemia   Hyperlipidemia is managed with atorvastatin  and Zetia . The cardiologist recommended Zetia  to further lower LDL levels despite good overall cholesterol numbers. Continue atorvastatin  80 mg and Zetia  10 mg.  Depression   Depression is managed with Wellbutrin  300 mg. Mood is stable with no reported thoughts of suicide or significant mood disturbances. Continue Wellbutrin  300 mg.  Constipation and nausea secondary to medication   Constipation and nausea are attributed to medication side effects, particularly  from Mounjaro . Managed with fiber gummies and increased fluid intake. Continue fiber gummies and maintain increased fluid intake with low-sodium, low-sugar hydration solutions.  Shoulder and neck pain   Chronic shoulder and neck pain, possibly due to bursitis or irritation from side sleeping. Pain is more pronounced on the right side, with limited range of motion and audible cracking. Refer to a sports medicine specialist for evaluation and possible treatment.  Postherpetic neuralgia following shingles   Postherpetic neuralgia follows a recent shingles outbreak in early August. Pain persists occasionally, especially with overexertion. Gabapentin  has been effective in managing pain. Continue gabapentin  300 mg as needed. Plan for shingles vaccination in November.  Recommended follow up: Return in about 6 months (around 08/23/2024) for chronic follow-up 6 mo,  annual in July.  Lab/Order associations:+ fasting  Jenkins CHRISTELLA Carrel, MD

## 2024-02-24 NOTE — Patient Instructions (Addendum)
 It was very nice to see you today!  Cure   PLEASE NOTE:  If you had any lab tests please let us  know if you have not heard back within a few days. You may see your results on MyChart before we have a chance to review them but we will give you a call once they are reviewed by us . If we ordered any referrals today, please let us  know if you have not heard from their office within the next week.   Please try these tips to maintain a healthy lifestyle:  Eat most of your calories during the day when you are active. Eliminate processed foods including packaged sweets (pies, cakes, cookies), reduce intake of potatoes, white bread, white pasta, and white rice. Look for whole grain options, oat flour or almond flour.  Each meal should contain half fruits/vegetables, one quarter protein, and one quarter carbs (no bigger than a computer mouse).  Cut down on sweet beverages. This includes juice, soda, and sweet tea. Also watch fruit intake, though this is a healthier sweet option, it still contains natural sugar! Limit to 3 servings daily.  Drink at least 1 glass of water with each meal and aim for at least 8 glasses per day  Exercise at least 150 minutes every week.

## 2024-02-25 ENCOUNTER — Other Ambulatory Visit (HOSPITAL_BASED_OUTPATIENT_CLINIC_OR_DEPARTMENT_OTHER): Payer: Self-pay

## 2024-02-25 ENCOUNTER — Other Ambulatory Visit: Payer: Self-pay | Admitting: *Deleted

## 2024-02-25 MED ORDER — METFORMIN HCL 500 MG PO TABS
500.0000 mg | ORAL_TABLET | Freq: Every day | ORAL | 0 refills | Status: AC
  Filled 2024-02-25: qty 90, 90d supply, fill #0

## 2024-03-06 ENCOUNTER — Other Ambulatory Visit (HOSPITAL_BASED_OUTPATIENT_CLINIC_OR_DEPARTMENT_OTHER): Payer: Self-pay

## 2024-03-16 ENCOUNTER — Encounter: Payer: Self-pay | Admitting: Family Medicine

## 2024-03-16 ENCOUNTER — Ambulatory Visit: Admitting: Family Medicine

## 2024-03-16 VITALS — BP 126/80 | HR 82 | Temp 97.9°F | Ht 72.0 in | Wt 205.0 lb

## 2024-03-16 DIAGNOSIS — Z23 Encounter for immunization: Secondary | ICD-10-CM | POA: Diagnosis not present

## 2024-03-16 DIAGNOSIS — E1169 Type 2 diabetes mellitus with other specified complication: Secondary | ICD-10-CM | POA: Diagnosis not present

## 2024-03-16 DIAGNOSIS — R8290 Unspecified abnormal findings in urine: Secondary | ICD-10-CM

## 2024-03-16 DIAGNOSIS — R5383 Other fatigue: Secondary | ICD-10-CM

## 2024-03-16 LAB — POCT URINALYSIS DIPSTICK
Bilirubin, UA: NEGATIVE
Blood, UA: NEGATIVE
Glucose, UA: POSITIVE — AB
Ketones, UA: NEGATIVE
Leukocytes, UA: NEGATIVE
Nitrite, UA: NEGATIVE
Protein, UA: NEGATIVE
Spec Grav, UA: 1.015 (ref 1.010–1.025)
Urobilinogen, UA: 0.2 U/dL
pH, UA: 6 (ref 5.0–8.0)

## 2024-03-16 NOTE — Patient Instructions (Addendum)
 It was very nice to see you today!  Get dates of hep B  Check urine sitting and other members.   PLEASE NOTE:  If you had any lab tests please let us  know if you have not heard back within a few days. You may see your results on MyChart before we have a chance to review them but we will give you a call once they are reviewed by us . If we ordered any referrals today, please let us  know if you have not heard from their office within the next week.   Please try these tips to maintain a healthy lifestyle:  Eat most of your calories during the day when you are active. Eliminate processed foods including packaged sweets (pies, cakes, cookies), reduce intake of potatoes, white bread, white pasta, and white rice. Look for whole grain options, oat flour or almond flour.  Each meal should contain half fruits/vegetables, one quarter protein, and one quarter carbs (no bigger than a computer mouse).  Cut down on sweet beverages. This includes juice, soda, and sweet tea. Also watch fruit intake, though this is a healthier sweet option, it still contains natural sugar! Limit to 3 servings daily.  Drink at least 1 glass of water with each meal and aim for at least 8 glasses per day  Exercise at least 150 minutes every week.

## 2024-03-16 NOTE — Progress Notes (Signed)
 Subjective:     Patient ID: Reginald Kirby., male    DOB: May 13, 1976, 48 y.o.   MRN: [id number]  Chief Complaint  Patient presents with   Fatigue    Since last visit; worn out, easy to fall asleep; want to know if weight loss meds is contributing to it    Urine abnormality    Looks a little foamy but still yellow, no odor , no other symptoms, no frequency; x6-7 days     HPI Discussed the use of AI scribe software for clinical note transcription with the patient, who gave verbal consent to proceed.  History of Present Illness Reginald Kirby. is a 48 year old male with diabetes who presents with foamy urine and worsening neuropathy.  He has experienced foamy urine since Saturday, describing it as 'very, so fuzzy, you know, sudsy.' This occurs only at home, despite using different toilets, and he is concerned about potential kidney issues due to his diabetes.  He reports worsening neuropathy in his feet, described as a 'burning, stabbing, pins and needles type feeling.' The symptoms have intensified recently, affecting his ability to drive and sleep. The neuropathy is bilateral, affecting different feet at different times, and is particularly bothersome at night when lying down.  He manages his diabetes with dietary changes, avoiding sugars, and increasing physical activity. He mentions recent weight loss when changed from ozempic  to mounjaro  about 6-6 wks ago and a decrease in appetite, eating only one meal a day. No hypoglycemic symptoms despite the reduced food intake. He is currently taking amlodipine , metformin , and Jardiance , having been on Jardiance  for about a year.  No chest pain, shortness of breath, or other cardiac symptoms. He feels fatigued, particularly at the end of the day, and falls asleep quickly at night.  He has a history of diabetes and mentions a previous creatinine level of 1.1. No symptoms of urinary tract infection or sexually transmitted infections.     Health Maintenance Due  Topic Date Due   Hepatitis B Vaccines 19-59 Average Risk (1 of 3 - 19+ 3-dose series) Never done   FOOT EXAM  01/08/2024   COVID-19 Vaccine (5 - 2025-26 season) 02/16/2024    Past Medical History:  Diagnosis Date   Allergy Unkown   Demerol   Anxiety    Arthritis    DDD  BACK   Depression Unknown   Diabetes mellitus    GERD (gastroesophageal reflux disease)    Headache    HX MIGRAINES    Hyperlipidemia    Hypertension    Sinusitis     Past Surgical History:  Procedure Laterality Date   CARDIAC CATHETERIZATION     2006    OKAY    epidural injections   06/17/2000   LUMBAR LAMINECTOMY/DECOMPRESSION MICRODISCECTOMY N/A 10/13/2015   Procedure: L3-4 L4-5 L5-S1 Laminectomy;  Surgeon: Victory Gens, MD;  Location: [facility];  Service: Neurosurgery;  Laterality: N/A;  L3-4 L4-5 L5-S1 Laminectomy   SPINE SURGERY  10/12/15     Current Outpatient Medications:    amLODipine  (NORVASC ) 10 MG tablet, Take 1 tablet (10 mg total) by mouth daily., Disp: 90 tablet, Rfl: 3   atorvastatin  (LIPITOR) 80 MG tablet, Take 1 tablet (80 mg total) by mouth daily., Disp: 90 tablet, Rfl: 3   buPROPion  (WELLBUTRIN  XL) 300 MG 24 hr tablet, Take 1 tablet (300 mg total) by mouth daily., Disp: 90 tablet, Rfl: 1   empagliflozin  (JARDIANCE ) 10 MG TABS tablet, Take 1  tablet (10 mg total) by mouth daily before breakfast., Disp: 90 tablet, Rfl: 1   ezetimibe  (ZETIA ) 10 MG tablet, Take 1 tablet (10 mg total) by mouth daily., Disp: 90 tablet, Rfl: 3   gabapentin  (NEURONTIN ) 300 MG capsule, Take 1 capsule (300 mg total) by mouth 2 (two) times daily., Disp: 60 capsule, Rfl: 1   metFORMIN  (GLUCOPHAGE ) 500 MG tablet, Take 1 tablet (500 mg total) by mouth daily with breakfast., Disp: 90 tablet, Rfl: 0   metoprolol  succinate (TOPROL -XL) 25 MG 24 hr tablet, Take 1 tablet (25 mg total) by mouth daily., Disp: 90 tablet, Rfl: 3   omeprazole  (PRILOSEC) 40 MG capsule, Take 1 capsule (40 mg total) by  mouth daily., Disp: 90 capsule, Rfl: 1   tirzepatide  (MOUNJARO ) 7.5 MG/0.5ML Pen, Inject 7.5 mg into the skin once a week., Disp: 6 mL, Rfl: 1   valsartan -hydrochlorothiazide  (DIOVAN -HCT) 320-25 MG tablet, Take 1 tablet by mouth daily., Disp: 90 tablet, Rfl: 3  Allergies  Allergen Reactions   Meperidine Hcl Itching   Demerol Itching   ROS neg/noncontributory except as noted HPI/below      Objective:     BP 126/80   Pulse 82   Temp 97.9 F (36.6 C)   Ht 6' (1.829 m)   Wt 205 lb (93 kg)   SpO2 97%   BMI 27.80 kg/m  Wt Readings from Last 3 Encounters:  03/16/24 205 lb (93 kg)  02/24/24 213 lb 2 oz (96.7 kg)  10/10/23 220 lb 8 oz (100 kg)    Physical Exam   Gen: WDWN NAD HEENT: NCAT, conjunctiva not injected, sclera nonicteric NECK:  supple, no thyromegaly, no nodes CARDIAC: RRR, S1S2+, no murmur.  ABDOMEN:  BS+, soft, NTND, No HSM, no masses EXT:  no edema MSK: no gross abnormalities.  NEURO: A&O x3.  CN II-XII intact.  PSYCH: normal mood. Good eye contact  Results for orders placed or performed in visit on 03/16/24  POCT Urinalysis Dipstick   Collection Time: 03/16/24  4:10 PM  Result Value Ref Range   Color, UA yellow    Clarity, UA bubbly    Glucose, UA Positive (A) Negative   Bilirubin, UA negative    Ketones, UA negative    Spec Grav, UA 1.015 1.010 - 1.025   Blood, UA negative    pH, UA 6.0 5.0 - 8.0   Protein, UA Negative Negative   Urobilinogen, UA 0.2 0.2 or 1.0 E.U./dL   Nitrite, UA negative    Leukocytes, UA Negative Negative   Appearance clear    Odor slight         Assessment & Plan:  Abnormal urine -     POCT urinalysis dipstick  Type 2 diabetes mellitus with other specified complication, without long-term current use of insulin  (HCC) -     Microalbumin / creatinine urine ratio  Other fatigue  Flu vaccine need -     Flu vaccine trivalent PF, 6mos and older(Flulaval,Afluria,Fluarix,Fluzone)  Assessment and Plan Assessment &  Plan Type 2 diabetes mellitus with diabetic neuropathy   Neuropathy symptoms have intensified, presenting as burning, stabbing, and pins and needles sensations in his feet, more pronounced at night. Symptoms are bilateral but alternate feet and worse when lying on back.   . Neuropathy may be linked to diabetes or weight loss but may be from back as well, No proteinuria is detected. Recommend B complex vitamins, preferably dissolvable, and alpha lipoic acid 600 mg. Continue current diabetes management with metformin   and mounjaro , jardiance .  Pt on ARB/statin. Encourage ongoing dietary management and physical activity.  Foamy urine   Intermittent foamy urine is observed primarily at home, not at work. No gross proteinuria is detected. Possible environmental factors include water softener or cleaning agents at home. No signs of UTI or STI. Creatinine levels were normal a few weeks ago. Order a microalbumin creatinine ratio test. Inquire about potential water softener or cleaning agents at home. Advise sitting while urinating to assess if foaming persists and see if other family members have.  Fatigue   He experiences fatigue, possibly due to rapid weight loss and dietary changes. No cardiac symptoms or shortness of breath are present. Recent labs, including creatinine levels, are normal. Encourage balanced meals and adequate hydration- may not be eating enough calories..    Return for as sch in March.  Jenkins CHRISTELLA Carrel, MD

## 2024-03-17 LAB — MICROALBUMIN / CREATININE URINE RATIO
Creatinine,U: 101.5 mg/dL
Microalb Creat Ratio: 27.4 mg/g (ref 0.0–30.0)
Microalb, Ur: 2.8 mg/dL — ABNORMAL HIGH (ref 0.0–1.9)

## 2024-03-18 ENCOUNTER — Ambulatory Visit: Payer: Self-pay | Admitting: Family Medicine

## 2024-03-18 NOTE — Progress Notes (Signed)
 No abnormal  amount of protein in urine

## 2024-03-20 ENCOUNTER — Other Ambulatory Visit (HOSPITAL_BASED_OUTPATIENT_CLINIC_OR_DEPARTMENT_OTHER): Payer: Self-pay

## 2024-03-28 ENCOUNTER — Other Ambulatory Visit (HOSPITAL_COMMUNITY): Payer: Self-pay

## 2024-03-29 ENCOUNTER — Other Ambulatory Visit: Payer: Self-pay

## 2024-03-30 ENCOUNTER — Other Ambulatory Visit: Payer: Self-pay

## 2024-03-30 ENCOUNTER — Other Ambulatory Visit (HOSPITAL_COMMUNITY): Payer: Self-pay

## 2024-03-30 ENCOUNTER — Encounter: Payer: Self-pay | Admitting: Family Medicine

## 2024-03-30 ENCOUNTER — Encounter: Payer: Self-pay | Admitting: Pharmacist

## 2024-04-14 ENCOUNTER — Other Ambulatory Visit: Payer: Self-pay

## 2024-04-16 ENCOUNTER — Other Ambulatory Visit (HOSPITAL_BASED_OUTPATIENT_CLINIC_OR_DEPARTMENT_OTHER): Payer: Self-pay

## 2024-04-29 ENCOUNTER — Other Ambulatory Visit (HOSPITAL_COMMUNITY): Payer: Self-pay

## 2024-05-01 ENCOUNTER — Encounter: Payer: Self-pay | Admitting: Family Medicine

## 2024-05-01 ENCOUNTER — Other Ambulatory Visit (HOSPITAL_BASED_OUTPATIENT_CLINIC_OR_DEPARTMENT_OTHER): Payer: Self-pay

## 2024-05-03 ENCOUNTER — Other Ambulatory Visit: Payer: Self-pay

## 2024-05-03 ENCOUNTER — Other Ambulatory Visit: Payer: Self-pay | Admitting: Family Medicine

## 2024-05-03 MED ORDER — GABAPENTIN 300 MG PO CAPS
600.0000 mg | ORAL_CAPSULE | Freq: Two times a day (BID) | ORAL | 1 refills | Status: AC
  Filled 2024-05-03: qty 120, 30d supply, fill #0

## 2024-05-04 ENCOUNTER — Other Ambulatory Visit: Payer: Self-pay

## 2024-05-04 ENCOUNTER — Other Ambulatory Visit (HOSPITAL_COMMUNITY): Payer: Self-pay

## 2024-05-15 ENCOUNTER — Other Ambulatory Visit (HOSPITAL_COMMUNITY): Payer: Self-pay

## 2024-06-04 NOTE — Progress Notes (Unsigned)
"             ° °   Reginald Jackson D.CLEMENTEEN [facility] [address] [place] [address] Phone: [phone]   Assessment and Plan:     ***    Pertinent previous records reviewed include ***   Follow Up: ***     Subjective:   I, Reginald Kirby, am serving as a neurosurgeon for Doctor Morene Mace  Chief Complaint: left shoulder pain   HPI:   06/07/2024 Patient is a 48 year old male with left shoulder pain. Patient states   Relevant Historical Information: ***  Additional pertinent review of systems negative.  Current Medications[1]   Objective:     There were no vitals filed for this visit.    There is no height or weight on file to calculate BMI.    Physical Exam:    ***   Electronically signed by:  Odis Mace D.[facility]MENT[facility] 7:35 AM 06/04/2024    [1]  Current Outpatient Medications:    amLODipine  (NORVASC ) 10 MG tablet, Take 1 tablet (10 mg total) by mouth daily., Disp: 90 tablet, Rfl: 3   atorvastatin  (LIPITOR) 80 MG tablet, Take 1 tablet (80 mg total) by mouth daily., Disp: 90 tablet, Rfl: 3   buPROPion  (WELLBUTRIN  XL) 300 MG 24 hr tablet, Take 1 tablet (300 mg total) by mouth daily., Disp: 90 tablet, Rfl: 1   empagliflozin  (JARDIANCE ) 10 MG TABS tablet, Take 1 tablet (10 mg total) by mouth daily before breakfast., Disp: 90 tablet, Rfl: 1   ezetimibe  (ZETIA ) 10 MG tablet, Take 1 tablet (10 mg total) by mouth daily., Disp: 90 tablet, Rfl: 3   gabapentin  (NEURONTIN ) 300 MG capsule, Take 2 capsules (600 mg total) by mouth 2 (two) times daily., Disp: 120 capsule, Rfl: 1   metFORMIN  (GLUCOPHAGE ) 500 MG tablet, Take 1 tablet (500 mg total) by mouth daily with breakfast., Disp: 90 tablet, Rfl: 0   metoprolol  succinate (TOPROL -XL) 25 MG 24 hr tablet, Take 1 tablet (25 mg total) by mouth daily., Disp: 90 tablet, Rfl: 3   omeprazole  (PRILOSEC) 40 MG capsule, Take 1 capsule (40 mg total) by mouth daily., Disp: 90 capsule, Rfl: 1    tirzepatide  (MOUNJARO ) 7.5 MG/0.5ML Pen, Inject 7.5 mg into the skin once a week., Disp: 6 mL, Rfl: 1   valsartan -hydrochlorothiazide  (DIOVAN -HCT) 320-25 MG tablet, Take 1 tablet by mouth daily., Disp: 90 tablet, Rfl: 3  "

## 2024-06-07 ENCOUNTER — Ambulatory Visit: Admitting: Sports Medicine

## 2024-06-07 VITALS — BP 120/84 | HR 76 | Ht 72.0 in | Wt 203.0 lb

## 2024-06-07 DIAGNOSIS — M7552 Bursitis of left shoulder: Secondary | ICD-10-CM

## 2024-06-07 DIAGNOSIS — G8929 Other chronic pain: Secondary | ICD-10-CM

## 2024-06-07 DIAGNOSIS — M25512 Pain in left shoulder: Secondary | ICD-10-CM | POA: Diagnosis not present

## 2024-06-07 NOTE — Patient Instructions (Addendum)
 Shoulder HEP   4 week follow up

## 2024-06-26 ENCOUNTER — Other Ambulatory Visit: Payer: Self-pay | Admitting: Family Medicine

## 2024-06-26 MED ORDER — METOPROLOL SUCCINATE ER 25 MG PO TB24
25.0000 mg | ORAL_TABLET | Freq: Every day | ORAL | 3 refills | Status: AC
  Filled 2024-06-26: qty 90, 90d supply, fill #0

## 2024-06-28 ENCOUNTER — Other Ambulatory Visit (HOSPITAL_COMMUNITY): Payer: Self-pay

## 2024-06-28 ENCOUNTER — Other Ambulatory Visit (HOSPITAL_BASED_OUTPATIENT_CLINIC_OR_DEPARTMENT_OTHER): Payer: Self-pay

## 2024-06-28 ENCOUNTER — Other Ambulatory Visit: Payer: Self-pay

## 2024-06-29 ENCOUNTER — Other Ambulatory Visit: Payer: Self-pay

## 2024-06-29 ENCOUNTER — Other Ambulatory Visit (HOSPITAL_COMMUNITY): Payer: Self-pay

## 2024-07-01 NOTE — Progress Notes (Unsigned)
 "               Reginald Kirby D.CLEMENTEEN [facility] [address] [place] [address] Phone: [phone]   Assessment and Plan:     1. Chronic left shoulder pain (Primary) 2. 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  Still consistent with subacromial bursitis versus rotator cuff tendinopathy not fully resolved - Start Celebrex  200 mg twice daily x2 weeks.  If still having pain after 2 weeks, complete 3rd-week of NSAID. May use remaining NSAID as needed once daily for pain control.  Do not to use additional over-the-counter NSAIDs (ibuprofen , naproxen , Advil , Aleve , etc.) while taking prescription NSAIDs.  May use Tylenol  [phone] mg 2 to 3 times a day for breakthrough pain.  -Continue HEP and start physical therapy.  Referral sent  Pertinent previous records reviewed include none   Follow Up: 6 weeks for reevaluation.  Could consider x-ray versus ultrasound versus MRI   Subjective:   I, Claretha Schimke am a scribe for Dr. Mace.     Chief Complaint: left shoulder pain    HPI:    06/07/2024 Patient is a 49 year old male with left shoulder pain. Patient states pain started 2 months ago and has progressed since. Decreased ROM. Tylenol  for the pain and that does not help. Pain radiates down the arm when he reaches. Grip strength intact. No numbness or tingling. Pain wakes him through the night. He is a side sleeper.   07/02/2024 Patient states that the left shoulder is better but it is still sore and tight if he raise it above his head. He is doing the exercises and trying to improve his range of motion.    Relevant Historical Information: DM type II, hypertension  Additional pertinent review of systems negative.  Current Medications[1]    Objective:     Vitals:   07/02/24 1426  BP: (!) 140/70  Pulse: 71  SpO2: 97%  Weight: 204 lb (92.5 kg)  Height: 6' (1.829 m)      Body mass index is 27.67 kg/m.    Physical Exam:    Gen: Appears well, nad, nontoxic and pleasant Neuro:sensation intact, strength is 5/5, muscle tone wnl Skin: no suspicious lesion or defmority Psych: A&O, appropriate mood and affect   Left shoulder:  No deformity, swelling or muscle wasting No scapular winging FF 1870, abd 170, int 10, ext 80 TTP biceps groove, deltoid, trapezius, cervical paraspinal NTTP over the [place], clavicle, ac, coracoid,  humerus,   Positive neer, hawkins, empty can, obriens, crossarm, subscap liftoff, speeds Neg ant drawer, sulcus sign, apprehension Negative Spurling's test bilat FROM of neck     Electronically signed by:  Reginald Kirby D.[facility]MENT[facility] 3:00 PM 07/02/24     [1]  Current Outpatient Medications:    amLODipine  (NORVASC ) 10 MG tablet, Take 1 tablet (10 mg total) by mouth daily., Disp: 90 tablet, Rfl: 3   atorvastatin  (LIPITOR) 80 MG tablet, Take 1 tablet (80 mg total) by mouth daily., Disp: 90 tablet, Rfl: 3   buPROPion  (WELLBUTRIN  XL) 300 MG 24 hr tablet, Take 1 tablet (300 mg total) by mouth daily., Disp: 90 tablet, Rfl: 1   empagliflozin  (JARDIANCE ) 10 MG TABS tablet, Take 1 tablet (10 mg total) by mouth daily before breakfast., Disp: 90  tablet, Rfl: 1   ezetimibe  (ZETIA ) 10 MG tablet, Take 1 tablet (10 mg total) by mouth daily., Disp: 90 tablet, Rfl: 3   gabapentin  (NEURONTIN ) 300 MG capsule, Take 2 capsules (600 mg total) by mouth 2 (two) times daily., Disp: 120 capsule, Rfl: 1   metFORMIN  (GLUCOPHAGE ) 500 MG tablet, Take 1 tablet (500 mg total) by mouth daily with breakfast., Disp: 90 tablet, Rfl: 0   metoprolol  succinate (TOPROL -XL) 25 MG 24 hr tablet, Take 1 tablet (25 mg total) by mouth daily., Disp: 90 tablet, Rfl: 3   omeprazole  (PRILOSEC) 40 MG capsule, Take 1 capsule  (40 mg total) by mouth daily., Disp: 90 capsule, Rfl: 1   tirzepatide  (MOUNJARO ) 7.5 MG/0.5ML Pen, Inject 7.5 mg into the skin once a week., Disp: 6 mL, Rfl: 1   valsartan -hydrochlorothiazide  (DIOVAN -HCT) 320-25 MG tablet, Take 1 tablet by mouth daily., Disp: 90 tablet, Rfl: 3  "

## 2024-07-02 ENCOUNTER — Ambulatory Visit: Admitting: Sports Medicine

## 2024-07-02 ENCOUNTER — Other Ambulatory Visit (HOSPITAL_BASED_OUTPATIENT_CLINIC_OR_DEPARTMENT_OTHER): Payer: Self-pay

## 2024-07-02 VITALS — BP 140/70 | HR 71 | Ht 72.0 in | Wt 204.0 lb

## 2024-07-02 DIAGNOSIS — M7552 Bursitis of left shoulder: Secondary | ICD-10-CM

## 2024-07-02 DIAGNOSIS — M25512 Pain in left shoulder: Secondary | ICD-10-CM

## 2024-07-02 DIAGNOSIS — G8929 Other chronic pain: Secondary | ICD-10-CM

## 2024-07-02 MED ORDER — CELECOXIB 200 MG PO CAPS
200.0000 mg | ORAL_CAPSULE | Freq: Two times a day (BID) | ORAL | 0 refills | Status: AC
  Filled 2024-07-02: qty 60, 30d supply, fill #0

## 2024-07-02 NOTE — Patient Instructions (Addendum)
-   Start Celebrex  200 mg daily x2 weeks.  If still having pain after 2 weeks, complete 3rd-week of NSAID. May use remaining NSAID as needed once daily for pain control.  Do not to use additional over-the-counter NSAIDs (ibuprofen , naproxen , Advil , Aleve , etc.) while taking prescription NSAIDs.  May use Tylenol  [phone] mg 2 to 3 times a day for breakthrough pain.  PT referral to [facility]. Follow up in 6 weeks.

## 2024-07-05 ENCOUNTER — Ambulatory Visit: Admitting: Sports Medicine

## 2024-07-07 ENCOUNTER — Other Ambulatory Visit: Payer: Self-pay

## 2024-07-07 ENCOUNTER — Other Ambulatory Visit (HOSPITAL_COMMUNITY): Payer: Self-pay

## 2024-07-07 MED ORDER — EZETIMIBE 10 MG PO TABS
10.0000 mg | ORAL_TABLET | Freq: Every day | ORAL | 3 refills | Status: AC
  Filled 2024-07-07: qty 90, 90d supply, fill #0

## 2024-07-15 ENCOUNTER — Other Ambulatory Visit: Payer: Self-pay

## 2024-08-27 ENCOUNTER — Ambulatory Visit: Admitting: Family Medicine
